# Patient Record
Sex: Female | Born: 1970 | Race: Black or African American | Hispanic: No | Smoking: Current every day smoker
Health system: Southern US, Community
[De-identification: ages and names within clinical notes are randomized; demographics above are authoritative.]

## PROBLEM LIST (undated history)

## (undated) DIAGNOSIS — I341 Nonrheumatic mitral (valve) prolapse: Secondary | ICD-10-CM

## (undated) DIAGNOSIS — K219 Gastro-esophageal reflux disease without esophagitis: Secondary | ICD-10-CM

## (undated) DIAGNOSIS — I10 Essential (primary) hypertension: Secondary | ICD-10-CM

## (undated) DIAGNOSIS — K5790 Diverticulosis of intestine, part unspecified, without perforation or abscess without bleeding: Secondary | ICD-10-CM

## (undated) HISTORY — PX: APPENDECTOMY: SHX54

## (undated) HISTORY — PX: UPPER GASTROINTESTINAL ENDOSCOPY: SHX188

## (undated) HISTORY — DX: Gastro-esophageal reflux disease without esophagitis: K21.9

---

## 1998-06-28 ENCOUNTER — Emergency Department (HOSPITAL_COMMUNITY): Admission: EM | Admit: 1998-06-28 | Discharge: 1998-06-28 | Payer: Self-pay | Admitting: Emergency Medicine

## 1998-12-09 ENCOUNTER — Emergency Department (HOSPITAL_COMMUNITY): Admission: EM | Admit: 1998-12-09 | Discharge: 1998-12-09 | Payer: Self-pay | Admitting: Emergency Medicine

## 1999-03-15 ENCOUNTER — Inpatient Hospital Stay (HOSPITAL_COMMUNITY): Admission: AD | Admit: 1999-03-15 | Discharge: 1999-03-15 | Payer: Self-pay | Admitting: Obstetrics & Gynecology

## 1999-04-13 ENCOUNTER — Encounter (INDEPENDENT_AMBULATORY_CARE_PROVIDER_SITE_OTHER): Payer: Self-pay | Admitting: *Deleted

## 1999-04-15 ENCOUNTER — Encounter: Admission: RE | Admit: 1999-04-15 | Discharge: 1999-04-15 | Payer: Self-pay | Admitting: Pediatrics

## 1999-06-01 ENCOUNTER — Emergency Department (HOSPITAL_COMMUNITY): Admission: EM | Admit: 1999-06-01 | Discharge: 1999-06-01 | Payer: Self-pay | Admitting: Physician Assistant

## 1999-10-25 ENCOUNTER — Encounter: Admission: RE | Admit: 1999-10-25 | Discharge: 1999-10-25 | Payer: Self-pay | Admitting: Family Medicine

## 1999-12-15 ENCOUNTER — Encounter: Admission: RE | Admit: 1999-12-15 | Discharge: 1999-12-15 | Payer: Self-pay | Admitting: Family Medicine

## 2000-02-08 ENCOUNTER — Encounter: Payer: Self-pay | Admitting: Emergency Medicine

## 2000-02-08 ENCOUNTER — Emergency Department (HOSPITAL_COMMUNITY): Admission: EM | Admit: 2000-02-08 | Discharge: 2000-02-08 | Payer: Self-pay | Admitting: Emergency Medicine

## 2000-07-08 ENCOUNTER — Emergency Department (HOSPITAL_COMMUNITY): Admission: EM | Admit: 2000-07-08 | Discharge: 2000-07-08 | Payer: Self-pay | Admitting: Emergency Medicine

## 2000-07-10 ENCOUNTER — Emergency Department (HOSPITAL_COMMUNITY): Admission: EM | Admit: 2000-07-10 | Discharge: 2000-07-10 | Payer: Self-pay | Admitting: Emergency Medicine

## 2000-10-02 ENCOUNTER — Emergency Department (HOSPITAL_COMMUNITY): Admission: EM | Admit: 2000-10-02 | Discharge: 2000-10-02 | Payer: Self-pay | Admitting: Emergency Medicine

## 2000-10-04 ENCOUNTER — Emergency Department (HOSPITAL_COMMUNITY): Admission: EM | Admit: 2000-10-04 | Discharge: 2000-10-04 | Payer: Self-pay | Admitting: Emergency Medicine

## 2001-03-07 ENCOUNTER — Encounter: Payer: Self-pay | Admitting: *Deleted

## 2001-03-07 ENCOUNTER — Inpatient Hospital Stay (HOSPITAL_COMMUNITY): Admission: AD | Admit: 2001-03-07 | Discharge: 2001-03-07 | Payer: Self-pay | Admitting: *Deleted

## 2001-07-22 ENCOUNTER — Inpatient Hospital Stay (HOSPITAL_COMMUNITY): Admission: AD | Admit: 2001-07-22 | Discharge: 2001-07-22 | Payer: Self-pay | Admitting: Obstetrics

## 2001-11-22 ENCOUNTER — Emergency Department (HOSPITAL_COMMUNITY): Admission: EM | Admit: 2001-11-22 | Discharge: 2001-11-22 | Payer: Self-pay

## 2002-03-22 ENCOUNTER — Emergency Department (HOSPITAL_COMMUNITY): Admission: EM | Admit: 2002-03-22 | Discharge: 2002-03-22 | Payer: Self-pay | Admitting: Emergency Medicine

## 2002-07-30 ENCOUNTER — Emergency Department (HOSPITAL_COMMUNITY): Admission: EM | Admit: 2002-07-30 | Discharge: 2002-07-30 | Payer: Self-pay | Admitting: Emergency Medicine

## 2002-07-30 ENCOUNTER — Encounter: Payer: Self-pay | Admitting: Emergency Medicine

## 2003-04-09 ENCOUNTER — Emergency Department (HOSPITAL_COMMUNITY): Admission: EM | Admit: 2003-04-09 | Discharge: 2003-04-09 | Payer: Self-pay | Admitting: Emergency Medicine

## 2003-07-03 ENCOUNTER — Emergency Department (HOSPITAL_COMMUNITY): Admission: EM | Admit: 2003-07-03 | Discharge: 2003-07-04 | Payer: Self-pay | Admitting: Emergency Medicine

## 2003-11-26 ENCOUNTER — Other Ambulatory Visit: Admission: RE | Admit: 2003-11-26 | Discharge: 2003-11-26 | Payer: Self-pay | Admitting: Internal Medicine

## 2003-12-12 ENCOUNTER — Encounter: Admission: RE | Admit: 2003-12-12 | Discharge: 2003-12-12 | Payer: Self-pay | Admitting: Internal Medicine

## 2004-11-10 ENCOUNTER — Emergency Department (HOSPITAL_COMMUNITY): Admission: EM | Admit: 2004-11-10 | Discharge: 2004-11-10 | Payer: Self-pay | Admitting: Emergency Medicine

## 2006-07-24 ENCOUNTER — Emergency Department (HOSPITAL_COMMUNITY): Admission: EM | Admit: 2006-07-24 | Discharge: 2006-07-24 | Payer: Self-pay | Admitting: Emergency Medicine

## 2006-09-08 ENCOUNTER — Emergency Department (HOSPITAL_COMMUNITY): Admission: EM | Admit: 2006-09-08 | Discharge: 2006-09-08 | Payer: Self-pay | Admitting: Emergency Medicine

## 2006-11-10 ENCOUNTER — Encounter (INDEPENDENT_AMBULATORY_CARE_PROVIDER_SITE_OTHER): Payer: Self-pay | Admitting: *Deleted

## 2006-11-25 ENCOUNTER — Emergency Department (HOSPITAL_COMMUNITY): Admission: EM | Admit: 2006-11-25 | Discharge: 2006-11-25 | Payer: Self-pay | Admitting: Emergency Medicine

## 2007-02-07 ENCOUNTER — Emergency Department (HOSPITAL_COMMUNITY): Admission: EM | Admit: 2007-02-07 | Discharge: 2007-02-07 | Payer: Self-pay | Admitting: Pediatrics

## 2007-10-13 ENCOUNTER — Emergency Department (HOSPITAL_COMMUNITY): Admission: EM | Admit: 2007-10-13 | Discharge: 2007-10-13 | Payer: Self-pay | Admitting: Emergency Medicine

## 2008-08-03 ENCOUNTER — Emergency Department (HOSPITAL_COMMUNITY): Admission: EM | Admit: 2008-08-03 | Discharge: 2008-08-03 | Payer: Self-pay | Admitting: Emergency Medicine

## 2009-04-09 ENCOUNTER — Inpatient Hospital Stay (HOSPITAL_COMMUNITY): Admission: EM | Admit: 2009-04-09 | Discharge: 2009-04-11 | Payer: Self-pay | Admitting: Emergency Medicine

## 2009-06-10 ENCOUNTER — Emergency Department (HOSPITAL_COMMUNITY): Admission: EM | Admit: 2009-06-10 | Discharge: 2009-06-10 | Payer: Self-pay | Admitting: Emergency Medicine

## 2009-09-12 HISTORY — PX: OTHER SURGICAL HISTORY: SHX169

## 2009-11-03 ENCOUNTER — Emergency Department (HOSPITAL_COMMUNITY): Admission: EM | Admit: 2009-11-03 | Discharge: 2009-11-03 | Payer: Self-pay | Admitting: Emergency Medicine

## 2009-11-10 ENCOUNTER — Emergency Department (HOSPITAL_COMMUNITY): Admission: EM | Admit: 2009-11-10 | Discharge: 2009-11-10 | Payer: Self-pay | Admitting: Emergency Medicine

## 2010-05-04 ENCOUNTER — Emergency Department (HOSPITAL_COMMUNITY): Admission: EM | Admit: 2010-05-04 | Discharge: 2010-05-04 | Payer: Self-pay | Admitting: Emergency Medicine

## 2010-05-31 ENCOUNTER — Ambulatory Visit: Payer: Self-pay | Admitting: Nurse Practitioner

## 2010-05-31 ENCOUNTER — Inpatient Hospital Stay (HOSPITAL_COMMUNITY): Admission: AD | Admit: 2010-05-31 | Discharge: 2010-06-01 | Payer: Self-pay | Admitting: Obstetrics & Gynecology

## 2010-06-01 ENCOUNTER — Inpatient Hospital Stay (HOSPITAL_COMMUNITY): Admission: EM | Admit: 2010-06-01 | Discharge: 2010-06-05 | Payer: Self-pay | Admitting: Emergency Medicine

## 2010-06-01 ENCOUNTER — Encounter: Payer: Self-pay | Admitting: Obstetrics & Gynecology

## 2010-07-08 ENCOUNTER — Ambulatory Visit: Payer: Self-pay | Admitting: Obstetrics and Gynecology

## 2010-07-14 ENCOUNTER — Ambulatory Visit (HOSPITAL_COMMUNITY): Admission: RE | Admit: 2010-07-14 | Discharge: 2010-07-14 | Payer: Self-pay | Admitting: Obstetrics and Gynecology

## 2010-07-22 ENCOUNTER — Ambulatory Visit: Payer: Self-pay | Admitting: Family Medicine

## 2010-08-30 ENCOUNTER — Inpatient Hospital Stay (HOSPITAL_COMMUNITY)
Admission: RE | Admit: 2010-08-30 | Discharge: 2010-09-01 | Payer: Self-pay | Source: Home / Self Care | Attending: Family Medicine | Admitting: Family Medicine

## 2010-09-07 ENCOUNTER — Ambulatory Visit: Payer: Self-pay | Admitting: Family Medicine

## 2010-09-08 ENCOUNTER — Inpatient Hospital Stay (HOSPITAL_COMMUNITY)
Admission: AD | Admit: 2010-09-08 | Discharge: 2010-09-08 | Payer: Self-pay | Source: Home / Self Care | Attending: Obstetrics & Gynecology | Admitting: Obstetrics & Gynecology

## 2010-09-13 ENCOUNTER — Ambulatory Visit: Payer: Self-pay | Admitting: Family Medicine

## 2010-10-11 ENCOUNTER — Ambulatory Visit
Admission: RE | Admit: 2010-10-11 | Discharge: 2010-10-11 | Payer: Self-pay | Source: Home / Self Care | Attending: Family Medicine | Admitting: Family Medicine

## 2010-10-11 LAB — POCT PREGNANCY, URINE: Preg Test, Ur: NEGATIVE

## 2010-10-12 NOTE — Progress Notes (Signed)
Cheryl Carlson, Cheryl Carlson NO.:  [id number]  MEDICAL RECORD NO.:  [id number]          PATIENT TYPE:  WOC  LOCATION:  [facility]                   FACILITY:  [facility]  PHYSICIAN:  Maryelizabeth Kaufmann, MD  DATE OF BIRTH:  [birth year]  DATE OF SERVICE:  10/11/2010                                 CLINIC NOTE  CHIEF COMPLAINT:  Final postop check.  HISTORY OF PRESENT ILLNESS:  This is a 40 year old gravida 2, para 0-0-2- 0 who is status post myomectomy and drainage of hydrosalpinx with a surgery date of August 30, 2010, who was doing well postoperatively. She denies any fever, chills, nausea, or vomiting.  She initially on her last visit, had a complaint of constipation, which is subsequently resolved and her last bowel movement as of today.  She is not taking any medication or anything in that regard.  She states that she is healing well.  She also notes that she has had some sexual intercourse since the Christmas holiday and she desires to be checked for her pregnancy.  Her last menstrual period is October 01, 2010.  PHYSICAL EXAMINATION:  VITAL SIGNS:  Blood pressure 132/89, pulse 76, temperature 98.5, weight 124.7, and height 65-1/2 inches. ABDOMEN:  Soft, nontender, and nondistended.  Positive bowel sounds. Incision is well healed clean, dry, and intact. CARDIOVASCULAR:  Regular rate and rhythm.  No murmurs, rubs, or gallops. CHEST:  Clear to auscultation bilaterally.  No wheezes, rales, or rhonchi.  ASSESSMENT:  This is a 40 year old gravida 2, para 0-0-2-0, status post myomectomy and drainage of hydrosalpinx for fibroid uterus and infertility.  PLAN:  Continue routine evaluation.  The patient is otherwise doing well postoperatively.  The patient's last Pap smear was in July 08, 2010. Otherwise, the patient may return for her next routine well-woman exam. We will do urine pregnancy test today.  Otherwise, the patient is to continue for contraception.     ______________________________ Maryelizabeth Kaufmann, MD    LC/MEDQ  D:  10/11/2010  T:  10/12/2010  Job:  [id number]

## 2010-11-22 LAB — BASIC METABOLIC PANEL
BUN: 9 mg/dL (ref 6–23)
CO2: 27 mEq/L (ref 19–32)
Calcium: 9.1 mg/dL (ref 8.4–10.5)
Glucose, Bld: 104 mg/dL — ABNORMAL HIGH (ref 70–99)
Sodium: 136 mEq/L (ref 135–145)

## 2010-11-22 LAB — CBC
HCT: 39.2 % (ref 36.0–46.0)
Hemoglobin: 13.1 g/dL (ref 12.0–15.0)
MCH: 29.5 pg (ref 26.0–34.0)
MCHC: 33.6 g/dL (ref 30.0–36.0)
MCV: 92.4 fL (ref 78.0–100.0)
Platelets: 234 10*3/uL (ref 150–400)
RBC: 4.24 MIL/uL (ref 3.87–5.11)
RDW: 14.5 % (ref 11.5–15.5)
WBC: 8.3 10*3/uL (ref 4.0–10.5)

## 2010-11-22 LAB — SURGICAL PCR SCREEN
MRSA, PCR: NEGATIVE
Staphylococcus aureus: NEGATIVE

## 2010-11-22 LAB — PREGNANCY, URINE: Preg Test, Ur: NEGATIVE

## 2010-11-25 LAB — URINALYSIS, ROUTINE W REFLEX MICROSCOPIC
Bilirubin Urine: NEGATIVE
Ketones, ur: 40 mg/dL — AB
Nitrite: NEGATIVE
Nitrite: NEGATIVE
Protein, ur: NEGATIVE mg/dL
Protein, ur: NEGATIVE mg/dL
Urobilinogen, UA: 0.2 mg/dL (ref 0.0–1.0)
pH: 5.5 (ref 5.0–8.0)

## 2010-11-25 LAB — RAPID URINE DRUG SCREEN, HOSP PERFORMED
Amphetamines: NOT DETECTED
Barbiturates: NOT DETECTED
Benzodiazepines: NOT DETECTED

## 2010-11-25 LAB — BASIC METABOLIC PANEL
BUN: 8 mg/dL (ref 6–23)
BUN: 8 mg/dL (ref 6–23)
BUN: 8 mg/dL (ref 6–23)
CO2: 28 mEq/L (ref 19–32)
CO2: 29 mEq/L (ref 19–32)
Calcium: 8.3 mg/dL — ABNORMAL LOW (ref 8.4–10.5)
Calcium: 8.4 mg/dL (ref 8.4–10.5)
Chloride: 100 mEq/L (ref 96–112)
Creatinine, Ser: 0.77 mg/dL (ref 0.4–1.2)
Creatinine, Ser: 0.92 mg/dL (ref 0.4–1.2)
Glucose, Bld: 139 mg/dL — ABNORMAL HIGH (ref 70–99)
Potassium: 4.4 mEq/L (ref 3.5–5.1)

## 2010-11-25 LAB — DIFFERENTIAL
Basophils Absolute: 0.1 10*3/uL (ref 0.0–0.1)
Basophils Relative: 0 % (ref 0–1)
Basophils Relative: 0 % (ref 0–1)
Eosinophils Absolute: 0.1 10*3/uL (ref 0.0–0.7)
Monocytes Absolute: 0.6 10*3/uL (ref 0.1–1.0)
Monocytes Absolute: 0.5 10*3/uL (ref 0.1–1.0)
Monocytes Absolute: 0.9 10*3/uL (ref 0.1–1.0)
Monocytes Relative: 5 % (ref 3–12)
Monocytes Relative: 3 % (ref 3–12)
Neutro Abs: 10.3 10*3/uL — ABNORMAL HIGH (ref 1.7–7.7)
Neutro Abs: 17.8 10*3/uL — ABNORMAL HIGH (ref 1.7–7.7)
Neutrophils Relative %: 55 % (ref 43–77)

## 2010-11-25 LAB — COMPREHENSIVE METABOLIC PANEL
CO2: 24 mEq/L (ref 19–32)
Calcium: 9.1 mg/dL (ref 8.4–10.5)
Chloride: 98 mEq/L (ref 96–112)
Creatinine, Ser: 0.72 mg/dL (ref 0.4–1.2)
GFR calc non Af Amer: >60 mL/min (ref >60)
Glucose, Bld: 109 mg/dL — ABNORMAL HIGH (ref 70–99)
Total Bilirubin: 1 mg/dL (ref 0.3–1.2)

## 2010-11-25 LAB — CBC
HCT: 35.8 % — ABNORMAL LOW (ref 36.0–46.0)
HCT: 41.6 % (ref 36.0–46.0)
Hemoglobin: 14.3 g/dL (ref 12.0–15.0)
Hemoglobin: 13.4 g/dL (ref 12.0–15.0)
MCH: 31 pg (ref 26.0–34.0)
MCH: 31.2 pg (ref 26.0–34.0)
MCH: 31.3 pg (ref 26.0–34.0)
MCH: 31.4 pg (ref 26.0–34.0)
MCH: 31.7 pg (ref 26.0–34.0)
MCHC: 33.7 g/dL (ref 30.0–36.0)
MCHC: 34.3 g/dL (ref 30.0–36.0)
MCHC: 34.2 g/dL (ref 30.0–36.0)
MCV: 92.2 fL (ref 78.0–100.0)
MCV: 92.6 fL (ref 78.0–100.0)
MCV: 92.7 fL (ref 78.0–100.0)
MCV: 92.6 fL (ref 78.0–100.0)
Platelets: 250 10*3/uL (ref 150–400)
Platelets: 267 10*3/uL (ref 150–400)
Platelets: 272 10*3/uL (ref 150–400)
Platelets: 313 10*3/uL (ref 150–400)
RBC: 3.6 MIL/uL — ABNORMAL LOW (ref 3.87–5.11)
RBC: 3.72 MIL/uL — ABNORMAL LOW (ref 3.87–5.11)
RBC: 4.49 MIL/uL (ref 3.87–5.11)
RDW: 14 % (ref 11.5–15.5)
RDW: 14.2 % (ref 11.5–15.5)
RDW: 14.2 % (ref 11.5–15.5)
WBC: 12.2 10*3/uL — ABNORMAL HIGH (ref 4.0–10.5)
WBC: 9.8 10*3/uL (ref 4.0–10.5)

## 2010-11-25 LAB — URINE CULTURE: Culture  Setup Time: 201108240139

## 2010-11-25 LAB — POCT I-STAT, CHEM 8
BUN: 9 mg/dL (ref 6–23)
Creatinine, Ser: 0.9 mg/dL (ref 0.4–1.2)
Hemoglobin: 15 g/dL (ref 12.0–15.0)
Potassium: 3.9 mEq/L (ref 3.5–5.1)
Sodium: 140 mEq/L (ref 135–145)

## 2010-11-25 LAB — WET PREP, GENITAL
Trich, Wet Prep: NONE SEEN
Yeast Wet Prep HPF POC: NONE SEEN

## 2010-11-25 LAB — GC/CHLAMYDIA PROBE AMP, GENITAL
Chlamydia, DNA Probe: NEGATIVE
GC Probe Amp, Genital: NEGATIVE

## 2010-11-25 LAB — POCT PREGNANCY, URINE: Preg Test, Ur: NEGATIVE

## 2010-12-19 LAB — URINALYSIS, ROUTINE W REFLEX MICROSCOPIC
Bilirubin Urine: NEGATIVE
Glucose, UA: NEGATIVE mg/dL
Specific Gravity, Urine: 1.015 (ref 1.005–1.030)
Urobilinogen, UA: 1 mg/dL (ref 0.0–1.0)
pH: 6 (ref 5.0–8.0)

## 2010-12-19 LAB — CBC
HCT: 39.1 % (ref 36.0–46.0)
HCT: 41.6 % (ref 36.0–46.0)
HCT: 41.7 % (ref 36.0–46.0)
HCT: 47.8 % — ABNORMAL HIGH (ref 36.0–46.0)
Hemoglobin: 14.1 g/dL (ref 12.0–15.0)
Hemoglobin: 15.8 g/dL — ABNORMAL HIGH (ref 12.0–15.0)
MCHC: 33.6 g/dL (ref 30.0–36.0)
MCV: 92.2 fL (ref 78.0–100.0)
MCV: 92.3 fL (ref 78.0–100.0)
MCV: 92.8 fL (ref 78.0–100.0)
MCV: 93.1 fL (ref 78.0–100.0)
Platelets: 255 10*3/uL (ref 150–400)
Platelets: 258 10*3/uL (ref 150–400)
Platelets: 273 10*3/uL (ref 150–400)
RBC: 4.24 MIL/uL (ref 3.87–5.11)
RBC: 4.49 MIL/uL (ref 3.87–5.11)
WBC: 11.5 10*3/uL — ABNORMAL HIGH (ref 4.0–10.5)
WBC: 16 10*3/uL — ABNORMAL HIGH (ref 4.0–10.5)
WBC: 20.6 10*3/uL — ABNORMAL HIGH (ref 4.0–10.5)
WBC: 33.8 10*3/uL — ABNORMAL HIGH (ref 4.0–10.5)

## 2010-12-19 LAB — CULTURE, BLOOD (ROUTINE X 2): Culture: NO GROWTH

## 2010-12-19 LAB — CARDIAC PANEL(CRET KIN+CKTOT+MB+TROPI)
CK, MB: 0.4 ng/mL (ref 0.3–4.0)
CK, MB: 0.5 ng/mL (ref 0.3–4.0)
Relative Index: INVALID (ref 0.0–2.5)
Troponin I: 0.01 ng/mL (ref 0.00–0.06)
Troponin I: 0.02 ng/mL (ref 0.00–0.06)

## 2010-12-19 LAB — DIFFERENTIAL
Eosinophils Absolute: 0 10*3/uL (ref 0.0–0.7)
Eosinophils Relative: 0 % (ref 0–5)
Eosinophils Relative: 0 % (ref 0–5)
Eosinophils Relative: 1 % (ref 0–5)
Lymphocytes Relative: 23 % (ref 12–46)
Lymphocytes Relative: 6 % — ABNORMAL LOW (ref 12–46)
Lymphs Abs: 1.3 10*3/uL (ref 0.7–4.0)
Lymphs Abs: 1.4 10*3/uL (ref 0.7–4.0)
Lymphs Abs: 3.7 10*3/uL (ref 0.7–4.0)
Monocytes Absolute: 0.5 10*3/uL (ref 0.1–1.0)
Monocytes Absolute: 1.7 10*3/uL — ABNORMAL HIGH (ref 0.1–1.0)
Monocytes Relative: 2 % — ABNORMAL LOW (ref 3–12)
Monocytes Relative: 5 % (ref 3–12)
Monocytes Relative: 6 % (ref 3–12)
Neutro Abs: 30.7 10*3/uL — ABNORMAL HIGH (ref 1.7–7.7)

## 2010-12-19 LAB — LIPID PANEL
LDL Cholesterol: 69 mg/dL (ref 0–99)
Triglycerides: 51 mg/dL (ref <150)
VLDL: 10 mg/dL (ref 0–40)

## 2010-12-19 LAB — RAPID URINE DRUG SCREEN, HOSP PERFORMED
Amphetamines: NOT DETECTED
Barbiturates: NOT DETECTED
Benzodiazepines: NOT DETECTED
Cocaine: NOT DETECTED

## 2010-12-19 LAB — URINE MICROSCOPIC-ADD ON

## 2010-12-19 LAB — BASIC METABOLIC PANEL
BUN: 10 mg/dL (ref 6–23)
BUN: 11 mg/dL (ref 6–23)
CO2: 24 mEq/L (ref 19–32)
Chloride: 100 mEq/L (ref 96–112)
Chloride: 106 mEq/L (ref 96–112)
Chloride: 107 mEq/L (ref 96–112)
Creatinine, Ser: 0.84 mg/dL (ref 0.4–1.2)
GFR calc non Af Amer: >60 mL/min (ref >60)
Potassium: 3.3 mEq/L — ABNORMAL LOW (ref 3.5–5.1)
Potassium: 3.8 mEq/L (ref 3.5–5.1)
Potassium: 4.2 mEq/L (ref 3.5–5.1)
Sodium: 136 mEq/L (ref 135–145)
Sodium: 138 mEq/L (ref 135–145)

## 2010-12-19 LAB — BLOOD GAS, VENOUS
Acid-Base Excess: 2.6 mmol/L — ABNORMAL HIGH (ref 0.0–2.0)
Bicarbonate: 25 mEq/L — ABNORMAL HIGH (ref 20.0–24.0)
O2 Saturation: 43 %
TCO2: 22.2 mmol/L (ref 0–100)
pCO2, Ven: 33.1 mmHg — ABNORMAL LOW (ref 45.0–50.0)
pO2, Ven: 20.8 mmHg — CL (ref 30.0–45.0)

## 2011-01-25 NOTE — Discharge Summary (Signed)
Cheryl Carlson, Cheryl Carlson               ACCOUNT NO.:  [id number]   MEDICAL RECORD NO.:  [id number]          PATIENT TYPE:  INP   LOCATION:  1418                         FACILITY:  [facility]   PHYSICIAN:  Hind I Elsaid, MD      DATE OF BIRTH:  1971-05-27   DATE OF ADMISSION:  04/08/2009  DATE OF DISCHARGE:  04/11/2009                               DISCHARGE SUMMARY   DISCHARGE DIAGNOSES:  1. Community acquired pneumonia.  2. History of hypertension.  3. History of mitral valve prolapse.  4. Tobacco abuse.  5. Polysubstance abuse.   DISCHARGE MEDICATIONS:  1. Avelox 400 mg p.o. daily for 10 days.  2. HCTZ 25 mg p.o. daily.   PROCEDURES:  1. Chest x-ray suspect early right lobar infiltrate.  2. Mild pectus excavatum deformity of the sternum.   HISTORY OF PRESENT ILLNESS:  This is a 40 year old female with past  medical history of hypertension and ongoing tobacco abuse who came to  the emergency room with progressive shortness of breath for the past 1  month.  Denies any fever or chills.  In the emergency room the patient  was found to have a pH of 7.49, CO2 of 33 and oxygen of 20.8, D-dimer  was 0.42 and noted to have a CBC of 20.6, with neutrophil shift.  Chest  x-ray did show early pneumonia.  Drug screen was positive for opiate and  tetrahydrocannabinol.   HOSPITAL COURSE:  1. Her blood culture x2 was negative and this was preliminary report.      The patient was treated as a case of community acquired pneumonia      with Zithromax and Rocephin.  During the hospital stay the      patient's symptoms significantly improved.  White blood cells      improved and her white blood cells today were found to be 11.5.      The patient was switched to p.o. Avelox.  The patient has no      hypoxia and did not require any form of oxygenation.  At this time      it was felt the patient was stable to be discharged home and the      patient needs to follow up with her primary care physician as an      outpatient.  2. Hypertension.  Her blood pressure remained under control with      hydrochlorothiazide and the patient is noncompliant with her      medication.  3. History of mitral valve prolapse.  The patient has BNP of 30 and no      evidence of JVP.  Chest x-ray revealed no evidence of pulmonary      edema.  The patient's 2-D echo has to be done and followed up with      her primary care physician as an outpatient as the 2-D echo cannot      be done until Monday.      Hind Bosie Helper, MD  Electronically Signed     HIE/MEDQ  D:  04/11/2009  T:  04/11/2009  Job:  [id number] 

## 2011-01-25 NOTE — H&P (Signed)
NAMEALVERNA, FAWLEY               ACCOUNT NO.:  [id number]   MEDICAL RECORD NO.:  [id number]          PATIENT TYPE:  EMS   LOCATION:  ED                           [facility]ACILITY[facility]   PHYSICIAN:  Lucile Crater, MD         DATE OF BIRTH:  1971-02-21   DATE OF ADMISSION:  04/09/2009  DATE OF DISCHARGE:                              HISTORY & PHYSICAL   CHIEF COMPLAINT:  Shortness of breath since the past 63-month.   HISTORY OF PRESENT ILLNESS:  Ms. Printy is a 40 year old female with  past medical history of hypertension and ongoing tobacco abuse.  She  comes to the ER complaining of progressive shortness of breath for the  past one month.  She noted shortness of breath a month ago that has been  gradually getting worse and she has dyspnea on exertion as well.  She  reports that her exercise tolerance has been gradually going down and  also she had difficulty climbing stairs today and she had to stop in the  middle, and so she was concerned and came to the ER.  She reported that  she was also having a productive cough for the past 67-month, but refused  to come see the doctor despite friends and family advising her to do so.  She still continues to smoke despite all of this.  She denies having any  fever or chills.  Upon further questioning, she states that she has  pressure in her chest which is non-radiating, on and off, no  precipitating factors, no alleviating or aggravating factors.  The pain  is not pleuritic, it is not positional and it is not reproduced with  palpation.  She has some nausea, but no vomiting.   REVIEW OF SYSTEMS:  A complete review of systems was done, which  included general, head, eyes, ears, nose, throat, cardiovascular,  respiratory, GI, GU, endocrine, musculoskeletal, skin, neurologic and  psychiatric - all are within normal limits other than what is mentioned  above.   PAST MEDICAL HISTORY:  1. Hypertension.  2. Hypertensive urgency.  3. Mitral valve  prolapse.  4. Tobacco abuse.   ALLERGIES:  NONE.   CURRENT MEDICATIONS AT HOME:  None.   SOCIAL HISTORY:  Tobacco abuse is ongoing, 20 pack years of smoking.  She currently smokes 1 pack per day.  Denies alcohol and illicit drugs.   FAMILY HISTORY:  Coronary artery disease, hypertension, hyperlipidemia.   PHYSICAL EXAMINATION:  VITAL SIGNS:  T-max 99.3, blood pressure 151/93,  pulse rate 112, respiratory rate 22.  O2 saturations 95% on room air.  GENERAL APPEARANCE:  Not in acute distress.  Alert, awake and oriented  x3.  Afebrile.  HEENT:  Normocephalic, atraumatic.  Pupils are equal and react to light  and accommodation.  Extraocular muscles are intact.  Mucous membranes  are moist.  NECK:  Supple.  No JVD, lymphadenopathy or carotid bruit.  CVS:  Regular rhythm, rate is normal.  No murmurs, rubs or gallops.  LUNGS:  Rhonchi bilaterally.  ABDOMEN:  Benign.  EXTREMITIES:  No clubbing, cyanosis or edema.  NEUROLOGIC:  Grossly nonfocal.   LABORATORIES AND STUDIES:  In the ER today, venous blood gas:  pH 7.49,  pCO2 of 33, pO2 of 20.  Chest x-ray two-view:  Suspect early right  basilar infiltrate.  D-dimer 0.42.  Sodium 138, potassium 3.3, chloride  106, bicarb 22, BUN 11, creatinine 0.8, blood glucose 88.  WBC 20,600,  hemoglobin 14, hematocrit 41, platelets 255,000.  Neutrophils 91%.  EKG:  Sinus tachycardia at the rate of 102 beats per minute.  Normal axis.  Normal PR and QT intervals.  QRS morphology normal.  No ST-T changes  indicating ischemia.   ASSESSMENT/PLAN:  1. Community-acquired pneumonia.  The patient has been having these      symptoms for more than a month.  She has elevated white count      today.  We will start her on IV antibiotics.  We will supplement      oxygen if needed.  2. Chest pain.  The patient has risk factors like hypertension,      tobacco abuse and family history.  Her blood pressure is      uncontrolled.  We will cycle cardiac enzymes.  Her EKG  is negative      for any infarction or ischemia.  We will monitor her on telemetry.      If needed, we will get a stress test.  3. Hypertension, uncontrolled.  We will start the patient on      hydrochlorothiazide and lisinopril.  We will titrate the      medications for a goal blood pressure of 130/80 or less.  4. Deep venous thrombosis prophylaxis with unfractionated heparin.  5. Tobacco abuse.  Extensive counseling was done today.  She      verbalized understanding of this.  6. Fluid/Electrolyte/Nutrition:  We will place a saline lock on her.      We will replace her potassium.  She will be started on a healthy      heart diet.      Lucile Crater, MD  Electronically Signed     TA/MEDQ  D:  04/09/2009  T:  04/09/2009  Job:  [id number]

## 2011-06-02 LAB — CBC
HCT: 39
Hemoglobin: 13.3
MCHC: 34.1
MCV: 91.8
RBC: 4.24
RDW: 13.8

## 2011-06-02 LAB — DIFFERENTIAL
Eosinophils Relative: 1
Lymphocytes Relative: 56 — ABNORMAL HIGH
Monocytes Absolute: 0.8
Monocytes Relative: 7
Neutrophils Relative %: 35 — ABNORMAL LOW

## 2011-06-02 LAB — COMPREHENSIVE METABOLIC PANEL
ALT: 11
BUN: 9
CO2: 30
Calcium: 9
Creatinine, Ser: 0.79
GFR calc non Af Amer: >60
Glucose, Bld: 102 — ABNORMAL HIGH

## 2011-06-02 LAB — LIPASE, BLOOD: Lipase: 27

## 2011-06-02 LAB — POCT CARDIAC MARKERS
CKMB, poc: <1 — ABNORMAL LOW
Operator id: 2725

## 2012-05-01 ENCOUNTER — Emergency Department (HOSPITAL_COMMUNITY)
Admission: EM | Admit: 2012-05-01 | Discharge: 2012-05-02 | Disposition: A | Discharge status: Home / Self Care | Payer: Self-pay | Attending: Emergency Medicine | Admitting: Emergency Medicine

## 2012-05-01 ENCOUNTER — Encounter (HOSPITAL_COMMUNITY): Payer: Self-pay | Admitting: *Deleted

## 2012-05-01 DIAGNOSIS — A5909 Other urogenital trichomoniasis: Secondary | ICD-10-CM | POA: Insufficient documentation

## 2012-05-01 DIAGNOSIS — R112 Nausea with vomiting, unspecified: Secondary | ICD-10-CM | POA: Insufficient documentation

## 2012-05-01 DIAGNOSIS — R109 Unspecified abdominal pain: Secondary | ICD-10-CM | POA: Insufficient documentation

## 2012-05-01 HISTORY — DX: Essential (primary) hypertension: I10

## 2012-05-01 HISTORY — DX: Nonrheumatic mitral (valve) prolapse: I34.1

## 2012-05-01 LAB — CBC
Hemoglobin: 15.6 g/dL — ABNORMAL HIGH (ref 12.0–15.0)
MCH: 30.5 pg (ref 26.0–34.0)
MCV: 88.1 fL (ref 78.0–100.0)
RBC: 5.11 MIL/uL (ref 3.87–5.11)

## 2012-05-01 MED ORDER — SODIUM CHLORIDE 0.9 % IV BOLUS (SEPSIS)
1000.0000 mL | Freq: Once | INTRAVENOUS | Status: AC
  Administered 2012-05-02: 1000 mL via INTRAVENOUS

## 2012-05-01 NOTE — ED Notes (Signed)
Pt awoke with emesis at 2 am this morning and feeling constipated.  She had bm, but didn't feel any better.  C/o epigastric pain after vomiting.

## 2012-05-02 ENCOUNTER — Emergency Department (HOSPITAL_COMMUNITY): Payer: Self-pay

## 2012-05-02 ENCOUNTER — Encounter (HOSPITAL_COMMUNITY): Payer: Self-pay | Admitting: Radiology

## 2012-05-02 LAB — URINALYSIS, ROUTINE W REFLEX MICROSCOPIC
Bilirubin Urine: NEGATIVE
Specific Gravity, Urine: 1.012 (ref 1.005–1.030)
Urobilinogen, UA: 1 mg/dL (ref 0.0–1.0)

## 2012-05-02 LAB — URINE MICROSCOPIC-ADD ON

## 2012-05-02 LAB — PROTIME-INR
INR: 1.04 (ref 0.00–1.49)
Prothrombin Time: 13.8 seconds (ref 11.6–15.2)

## 2012-05-02 LAB — DIFFERENTIAL
Basophils Absolute: 0.1 10*3/uL (ref 0.0–0.1)
Basophils Relative: 1 % (ref 0–1)
Eosinophils Absolute: 0 10*3/uL (ref 0.0–0.7)
Eosinophils Relative: 0 % (ref 0–5)
Lymphocytes Relative: 20 % (ref 12–46)
Lymphs Abs: 3.4 10*3/uL (ref 0.7–4.0)
Monocytes Absolute: 1.7 10*3/uL — ABNORMAL HIGH (ref 0.1–1.0)
Monocytes Relative: 10 % (ref 3–12)
Neutro Abs: 11.7 10*3/uL — ABNORMAL HIGH (ref 1.7–7.7)
Neutrophils Relative %: 69 % (ref 43–77)

## 2012-05-02 LAB — BASIC METABOLIC PANEL
BUN: 6 mg/dL (ref 6–23)
CO2: 26 mEq/L (ref 19–32)
Chloride: 101 mEq/L (ref 96–112)
Creatinine, Ser: 0.68 mg/dL (ref 0.50–1.10)

## 2012-05-02 LAB — HEPATIC FUNCTION PANEL
Alkaline Phosphatase: 54 U/L (ref 39–117)
Bilirubin, Direct: 0.1 mg/dL (ref 0.0–0.3)
Total Bilirubin: 0.5 mg/dL (ref 0.3–1.2)

## 2012-05-02 LAB — GLUCOSE, CAPILLARY

## 2012-05-02 MED ORDER — ONDANSETRON HCL 4 MG/2ML IJ SOLN
4.0000 mg | Freq: Once | INTRAMUSCULAR | Status: AC
  Administered 2012-05-02: 4 mg via INTRAVENOUS
  Filled 2012-05-02: qty 2

## 2012-05-02 MED ORDER — IOHEXOL 300 MG/ML  SOLN: 100.0000 mL | Freq: Once | INTRAMUSCULAR | Status: DC | PRN

## 2012-05-02 MED ORDER — ONDANSETRON 8 MG PO TBDP: 8.0000 mg | ORAL_TABLET | Freq: Three times a day (TID) | ORAL | Status: DC | PRN

## 2012-05-02 MED ORDER — PROMETHAZINE HCL 25 MG RE SUPP: 25.0000 mg | Freq: Four times a day (QID) | RECTAL | Status: DC | PRN

## 2012-05-02 MED ORDER — HYDROMORPHONE HCL PF 1 MG/ML IJ SOLN
1.0000 mg | Freq: Once | INTRAMUSCULAR | Status: AC
  Administered 2012-05-02: 1 mg via INTRAVENOUS
  Filled 2012-05-02: qty 1

## 2012-05-02 MED ORDER — SODIUM CHLORIDE 0.9 % IV SOLN
Freq: Once | INTRAVENOUS | Status: AC
  Administered 2012-05-02: 100 mL/h via INTRAVENOUS

## 2012-05-02 MED ORDER — METRONIDAZOLE 500 MG PO TABS
2000.0000 mg | ORAL_TABLET | Freq: Once | ORAL | Status: AC
Start: 2012-05-02 — End: 2012-05-02
  Administered 2012-05-02: 2000 mg via ORAL
  Filled 2012-05-02: qty 4

## 2012-05-02 MED ORDER — HYDROCODONE-ACETAMINOPHEN 5-325 MG PO TABS: 1.0000 | ORAL_TABLET | ORAL | Status: DC | PRN

## 2012-05-02 NOTE — ED Notes (Signed)
PT back from X-ray.  States zofran reduced and pain reduced to 8/10.

## 2012-05-02 NOTE — ED Notes (Signed)
Pt states pain starting to increase now that she is up and moving.  States she will be able to pick meds up at pharmacy soon.  Pt notified of need to inform sexual partner.

## 2012-05-02 NOTE — ED Notes (Signed)
Pt back from CT

## 2012-05-02 NOTE — ED Notes (Signed)
Pt sleeping.  CT called to notify that pt has finished drinking contrast.

## 2012-05-02 NOTE — ED Notes (Signed)
Patient transported to X-ray 

## 2012-05-02 NOTE — ED Provider Notes (Addendum)
History     CSN: [id number]  Arrival date & time 05/01/12  2219   First MD Initiated Contact with Patient 05/01/12 2347      Chief Complaint  Patient presents with  . Emesis  . Abdominal Pain    (Consider location/radiation/quality/duration/timing/severity/associated sxs/prior treatment) HPI Comments: Pt with hx of HTN, appendectomy and partial hysterectomy comes in with cc of abd pain, nausea and emesis. Pt 's abd pain started last night around 2 am, as it woke her up from her sleep. The pain is located in the lower quadrants and suprapubic region, and is sharp, constant at baseline, with worsening episodes with movement and with emesis, po intake. Pt has had 10+ episodes of emesis, non bilious and non bloody. Pt has been constipated, and hasnt had a good BM in 1 week. Her last BM was earlier today, and she self disimpacted a little. Unsure if she is passing flatus.  Patient is a 41 y.o. female presenting with vomiting and abdominal pain. The history is provided by the patient.  Emesis  Associated symptoms include abdominal pain. Pertinent negatives include no headaches.  Abdominal Pain The primary symptoms of the illness include abdominal pain and vomiting. The primary symptoms of the illness do not include shortness of breath, nausea, dysuria, vaginal discharge or vaginal bleeding.  Additional symptoms associated with the illness include constipation. Symptoms associated with the illness do not include urgency or frequency.    Past Medical History  Diagnosis Date  . Mitral valve prolapse   . Hypertension     Past Surgical History  Procedure Date  . Appendectomy   . Uterine tumor removal 2011    No family history on file.  History  Substance Use Topics  . Smoking status: Current Everyday Smoker -- 0.5 packs/day    Types: Cigarettes  . Smokeless tobacco: Not on file  . Alcohol Use: No    OB History    Grav Para Term Preterm Abortions TAB SAB Ect Mult Living              Review of Systems  Constitutional: Positive for activity change.  HENT: Negative for neck pain.   Respiratory: Negative for shortness of breath.   Cardiovascular: Negative for chest pain.  Gastrointestinal: Positive for vomiting, abdominal pain and constipation. Negative for nausea.  Genitourinary: Positive for pelvic pain. Negative for dysuria, urgency, frequency, flank pain, decreased urine volume, vaginal bleeding and vaginal discharge.  Neurological: Negative for headaches.    Allergies  Review of patient's allergies indicates no known allergies.  Home Medications  No current outpatient prescriptions on file.  BP 187/120  Pulse 114  Temp 99.4 F (37.4 C) (Oral)  Resp 18  Ht 5\' 5"  (1.651 m)  Wt 127 lb (57.607 kg)  BMI 21.13 kg/m2  SpO2 99%  Physical Exam  Nursing note and vitals reviewed. Constitutional: She is oriented to person, place, and time. She appears well-developed and well-nourished.  HENT:  Head: Normocephalic and atraumatic.  Eyes: Conjunctivae and EOM are normal. Pupils are equal, round, and reactive to light.  Neck: Normal range of motion. Neck supple.  Cardiovascular: Normal rate, regular rhythm, normal heart sounds and intact distal pulses.   No murmur heard. Pulmonary/Chest: Effort normal. No respiratory distress. She has no wheezes.  Abdominal: Soft. Bowel sounds are normal. She exhibits no distension. There is no tenderness. There is no rebound and no guarding.  Genitourinary: Vagina normal and uterus normal.       External exam -  normal, no lesions Speculum exam: Pt has some white discharge, no blood Bimanual exam: Patient has no CMT, no adnexal tenderness or fullness and cervical os is closed  Neurological: She is alert and oriented to person, place, and time.  Skin: Skin is warm and dry.    ED Course  Procedures (including critical care time)  Labs Reviewed  CBC - Abnormal; Notable for the following:    WBC 16.8 (*)      Hemoglobin 15.6 (*)     All other components within normal limits  URINALYSIS, ROUTINE W REFLEX MICROSCOPIC  APTT  BASIC METABOLIC PANEL  HEPATIC FUNCTION PANEL  PROTIME-INR  PREGNANCY, URINE   No results found.   No diagnosis found.    MDM  DDx includes: SBO ACS syndrome Aortic Dissection Thrombosis of blood vessels Colitis AAA Tumors Intra abdominal abscess Mesenteric ischemia Diverticulitis Peritonitis Appendicitis Hernia Nephrolithiasis Pyelonephritis UTI/Cystitis Ovarian cyst TOA Ectopic pregnancy PID STD  A/P: Pt with surgical hx comes in with cc of sudden onset abd pain with associated n/v. Concerns for possible SBO. We will get AAS and start hydration with basic labs. Pt is s/p partial hysterectomy, and is not having intercourse - so concerns for STD low. She does have hx of uterine tumors. Will reassess, and decide id the patient needs a CT abd/pelvis.  5:46 AM CT negative. Tolerating PO. Will d/c. Pt to f/u with pcp in 2 days  Derwood Kaplan, MD 05/02/12 0546  6:32 AM Pt had specifically mentioned to me that she has no hx of STD and no vaginal discharge. Her Urine is trichomonas positive. Flagyl given. Pt reports having unprotected sex 3 days ago.  Derwood Kaplan, MD 05/02/12 [phone]

## 2012-05-05 ENCOUNTER — Emergency Department (HOSPITAL_COMMUNITY)
Admission: EM | Admit: 2012-05-05 | Discharge: 2012-05-05 | Discharge status: Against Medical Advice | Payer: Self-pay | Attending: Emergency Medicine | Admitting: Emergency Medicine

## 2012-05-05 ENCOUNTER — Encounter (HOSPITAL_COMMUNITY): Payer: Self-pay | Admitting: Family Medicine

## 2012-05-05 DIAGNOSIS — R0789 Other chest pain: Secondary | ICD-10-CM | POA: Insufficient documentation

## 2012-05-05 DIAGNOSIS — R109 Unspecified abdominal pain: Secondary | ICD-10-CM | POA: Insufficient documentation

## 2012-05-05 LAB — URINALYSIS, ROUTINE W REFLEX MICROSCOPIC
Nitrite: POSITIVE — AB
Protein, ur: 100 mg/dL — AB
Specific Gravity, Urine: 1.027 (ref 1.005–1.030)
Urobilinogen, UA: 1 mg/dL (ref 0.0–1.0)

## 2012-05-05 NOTE — ED Notes (Signed)
Pt sts abdominal pain, N,V, since the 20th. Unable to have a BM since the 20th. sts her abdomen is full and putting pressure on chest and making it flutter.

## 2012-09-15 IMAGING — US US TRANSVAGINAL NON-OB
1 series · 13 of 25 positions shown · non-contrast
Comparison: Abdominal pelvic CT 05/04/2010.  Pelvic ultrasound
12/12/2003.

CLINICAL DATA: Abdominal pelvic pain.  Known fibroids and right
adnexal mass on CT.



[Series 1: us pelvis complete modify · 0.19mm/px · 13 of 61 slices shown]
[im 1/61]
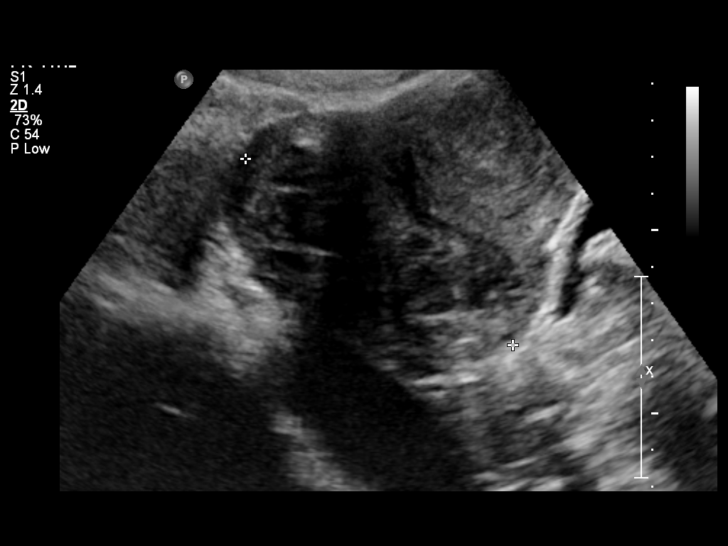
[im 6/61]
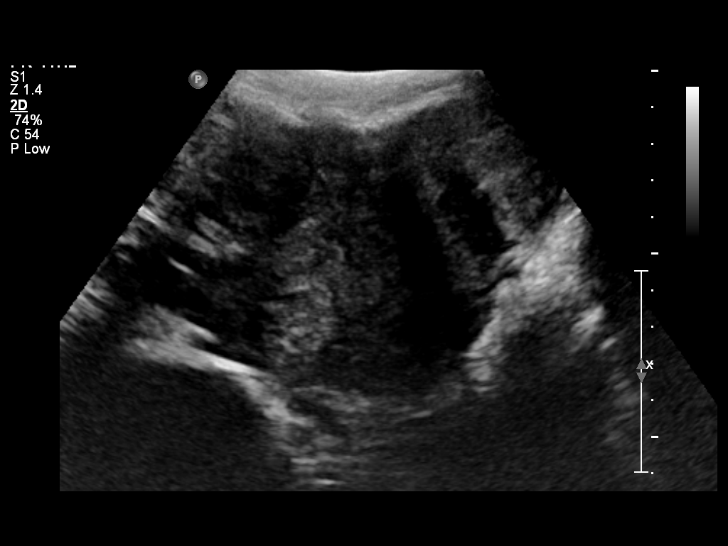
[im 11/61]
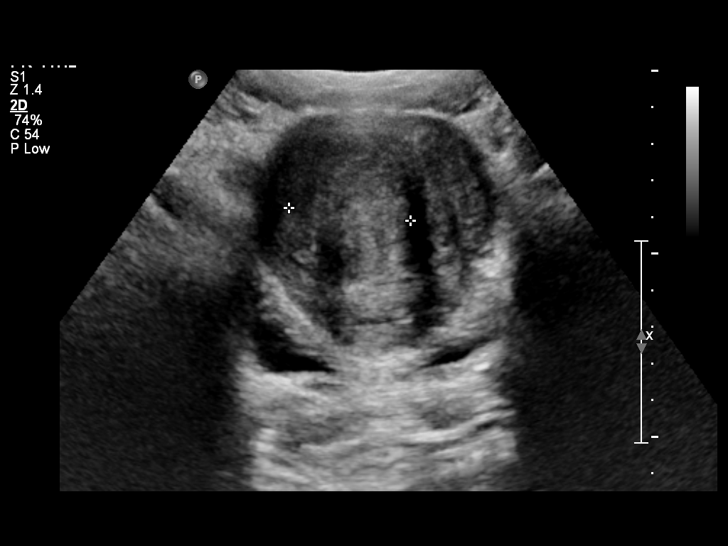
[im 16/61]
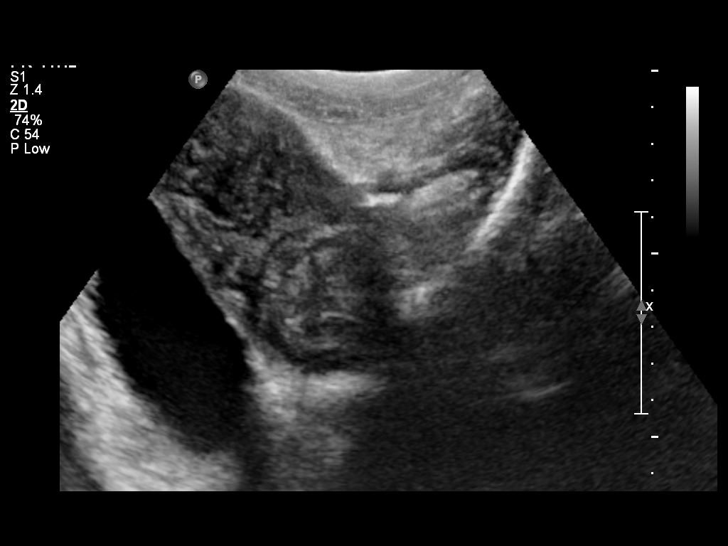
[im 21/61]
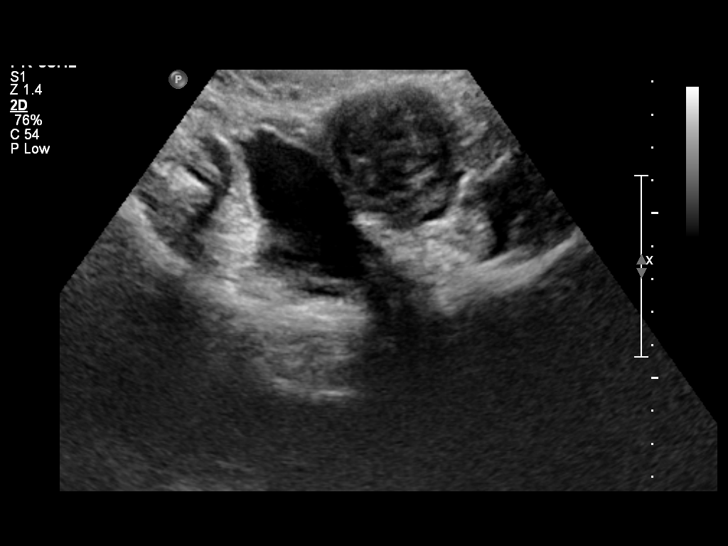
[im 26/61]
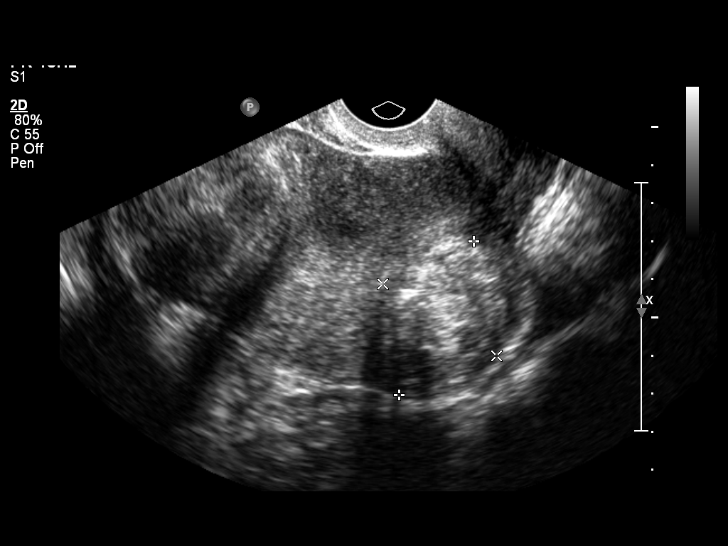
[im 31/61]
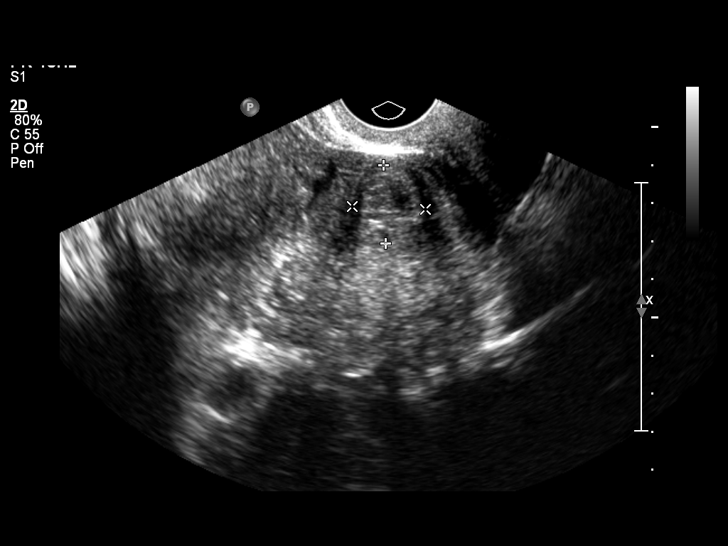
[im 36/61]
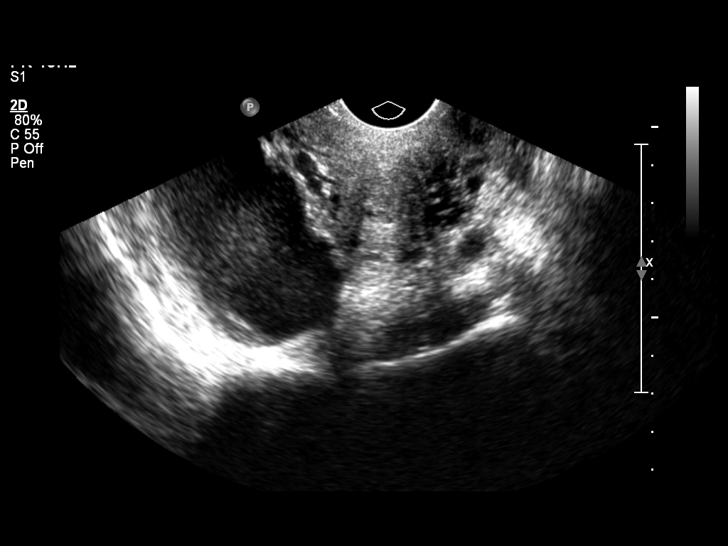
[im 41/61]
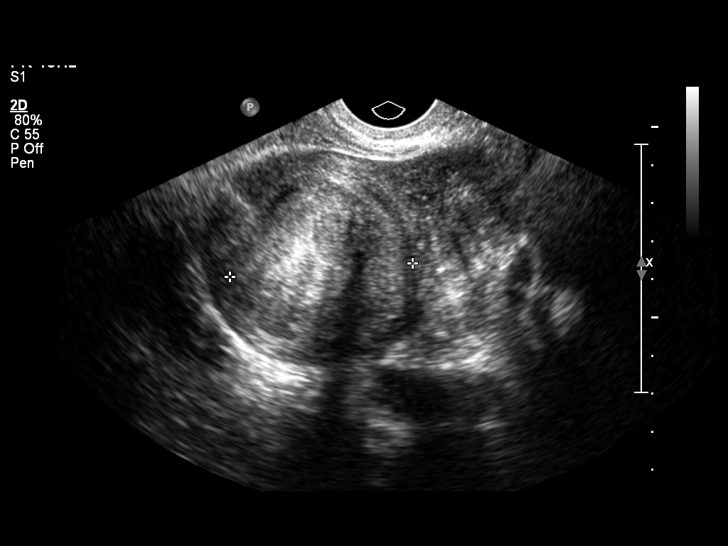
[im 46/61]
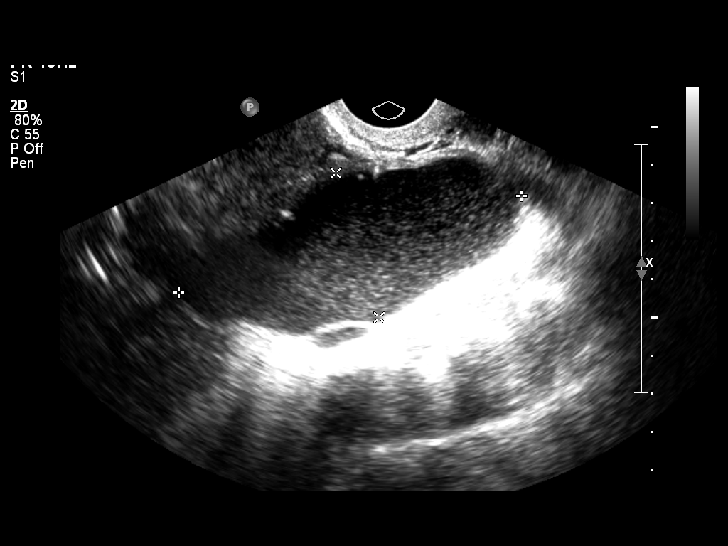
[im 51/61]
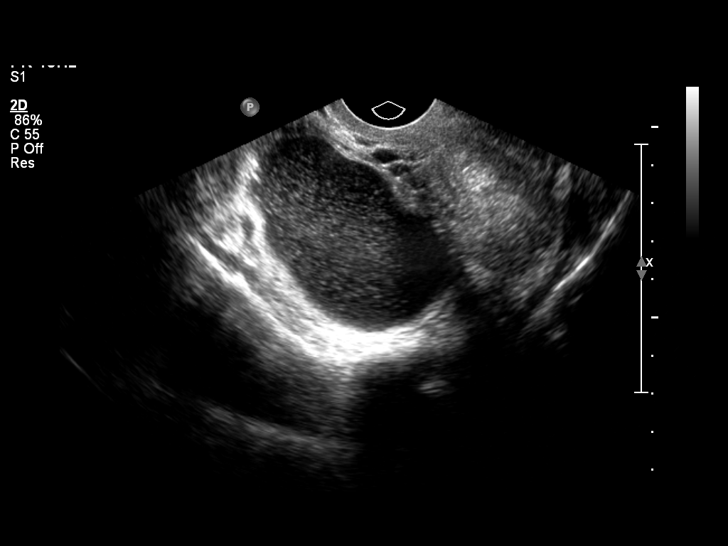
[im 56/61]
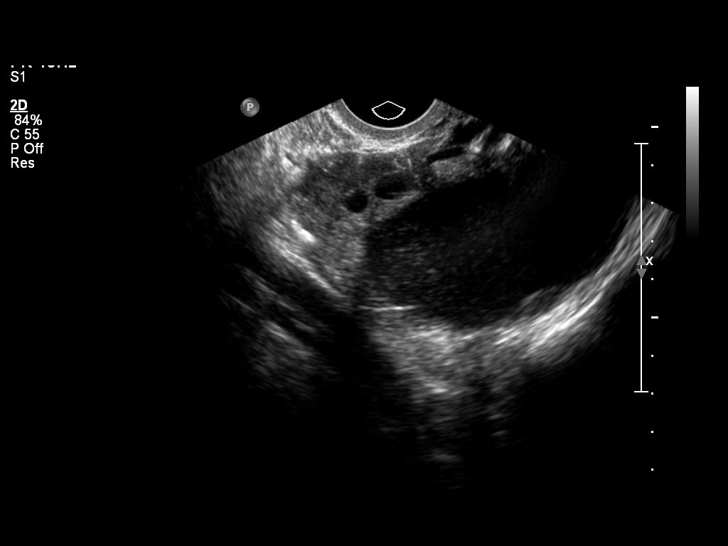
[im 61/61]
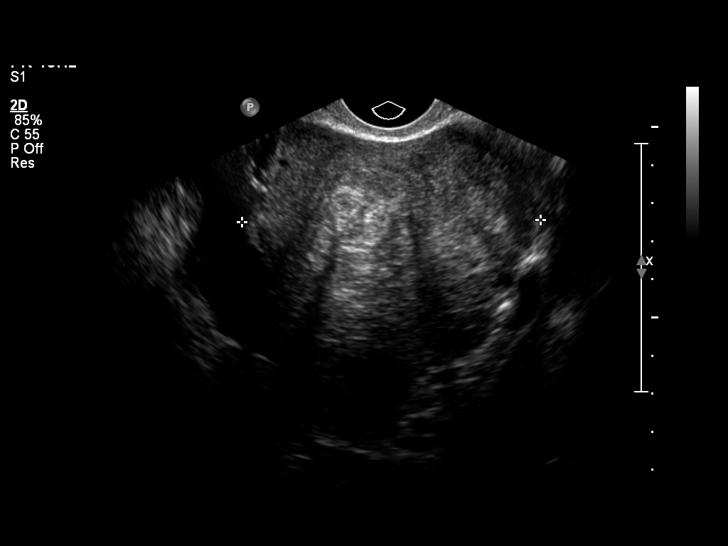

[13 of 25 positions shown; findings below may reference images not displayed]

FINDINGS: Uterus measures approximately 10.8 x 7.8 x 7.8 cm.  The myometrium
is diffusely heterogeneous.  At least five fibroids are
demonstrated.  Largest fibroid is in the fundal region and measures
5.4 x 5.4 x 4.8 cm.

Endometrium is mildly distorted by the fibroids and measures 5.5 mm
in thickness.

Right Ovary abuts a complex tubular cystic right adnexal lesion
which measures approximately 9.3 x 3.9 x 3.9 cm.  This cystic
structure demonstrates diffuse low level echoes.  It appears
unchanged in size from the CT of the 4 weeks ago.

Left Ovary is not visualized.

Other Findings:  No free pelvic fluid or solid adnexal mass is
identified.
IMPRESSION: 1.  Multiple uterine fibroids, unchanged from recent pelvic CT.
2.  Unchanged complex cystic right adnexal lesion may reflect a
hemorrhagic cyst or complicated hydrosalpinx (infection not
excluded although considered unlikely given the stability over 4
weeks).  This does not appear to contain any solid components.
3.  Ultrasound followup in 4-6 weeks is recommended.

## 2012-10-29 IMAGING — US US TRANSVAGINAL NON-OB
1 series · 13 of 25 positions shown · non-contrast
Comparison: CT 06/01/2010, ultrasound 05/31/2010

CLINICAL DATA: Follow-up right ovarian cyst

TRANSVAGINAL ULTRASOUND OF PELVIS
TECHNIQUE: Transvaginal ultrasound examination of the pelvis was
performed including evaluation of the uterus, ovaries, adnexal
regions, and pelvic cul-de-sac.

[Series 1: us transvaginal non-ob · 0.17mm/px · 13 of 57 slices shown]
[im 1/57]
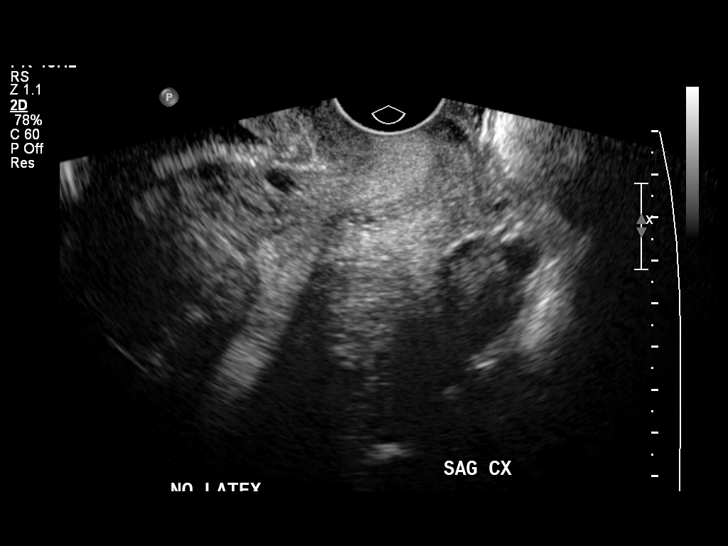
[im 5/57]
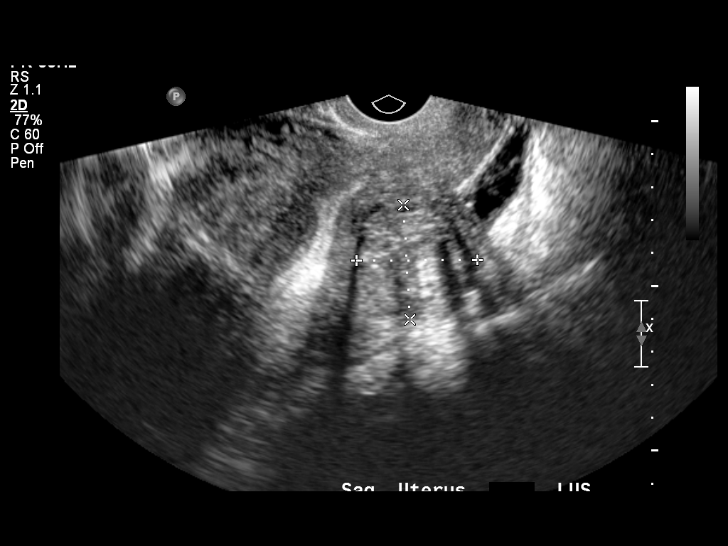
[im 10/57]
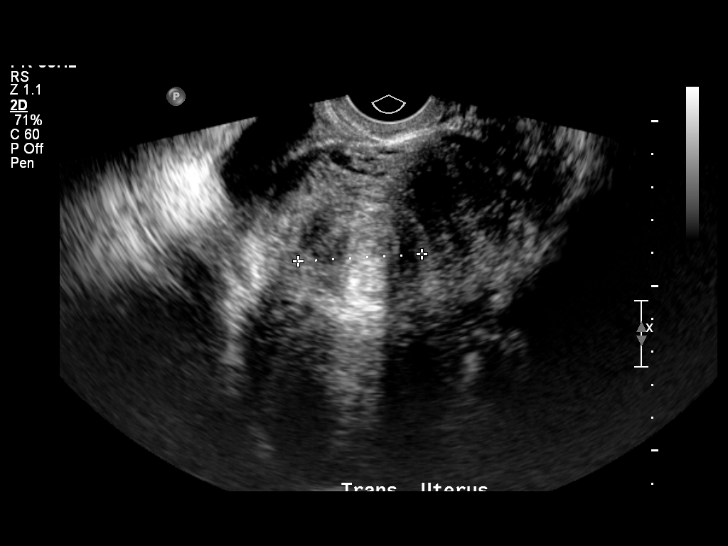
[im 15/57]
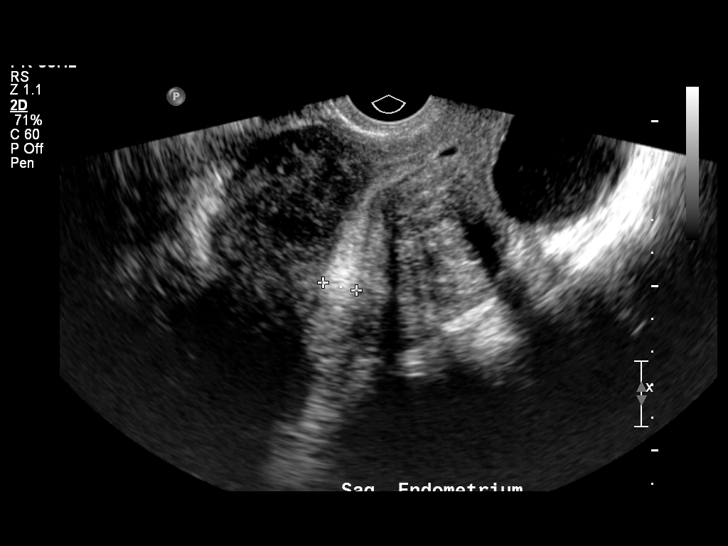
[im 19/57]
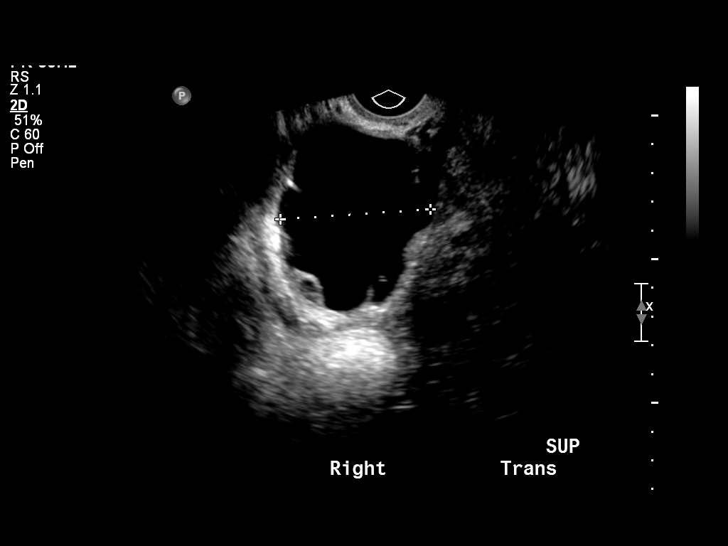
[im 24/57]
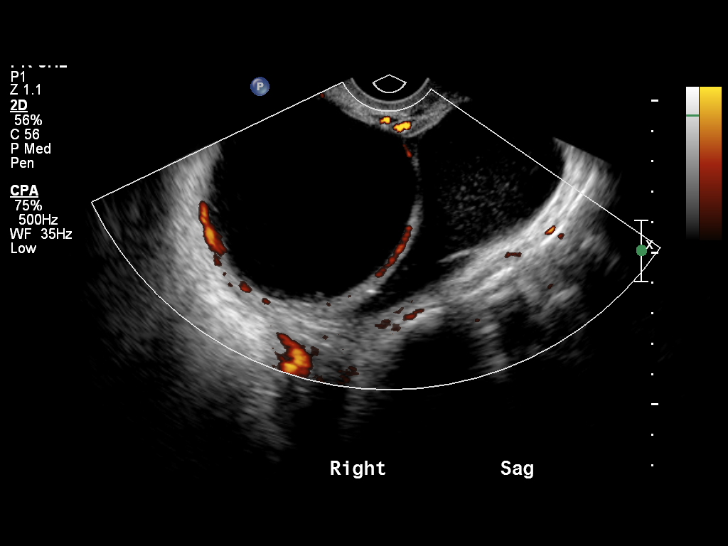
[im 29/57]
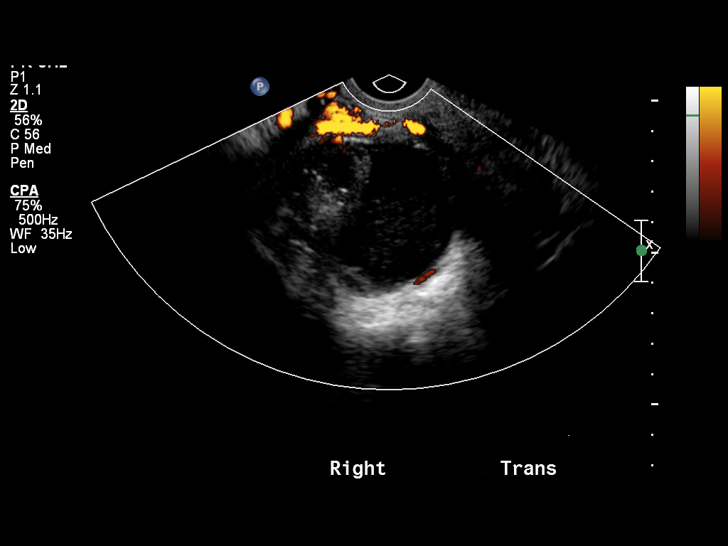
[im 33/57]
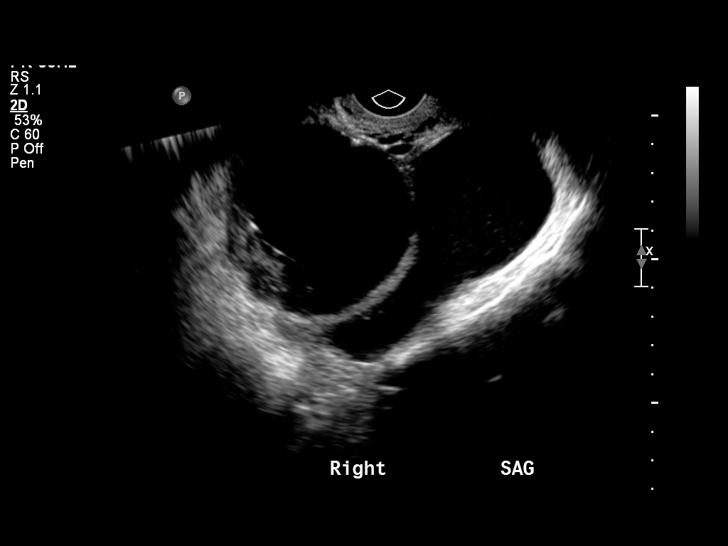
[im 38/57]
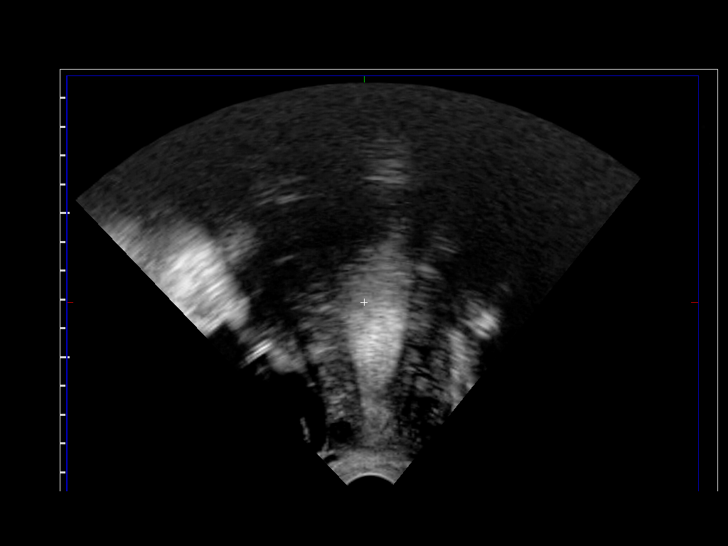
[im 43/57]
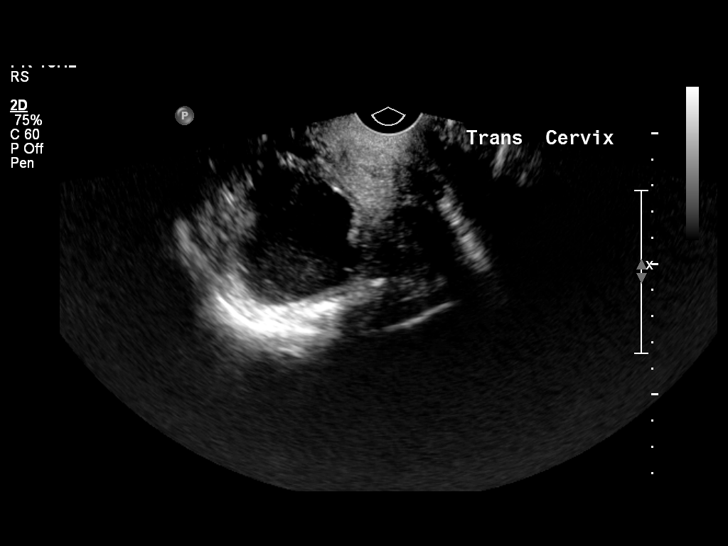
[im 47/57]
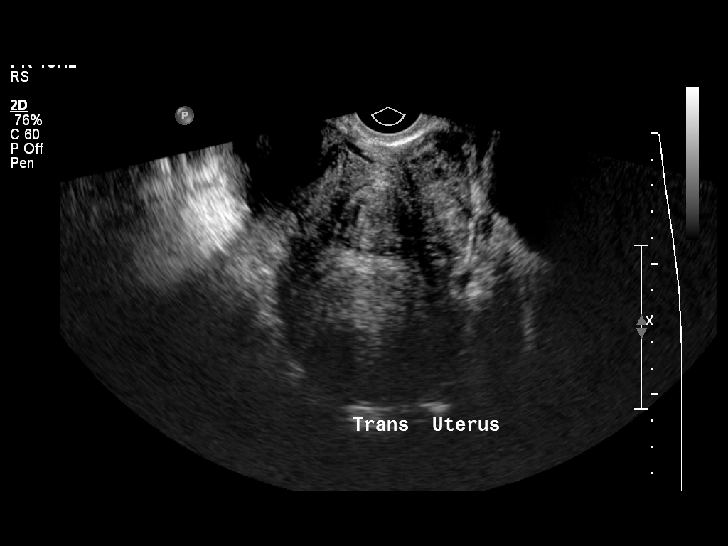
[im 52/57]
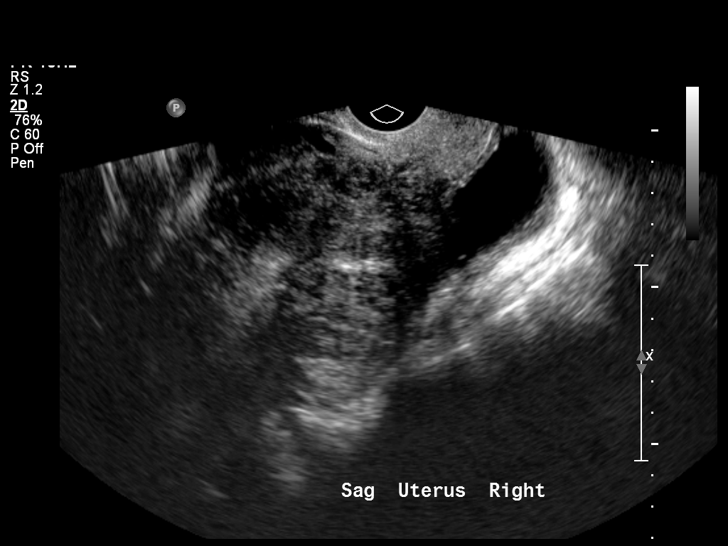
[im 57/57]
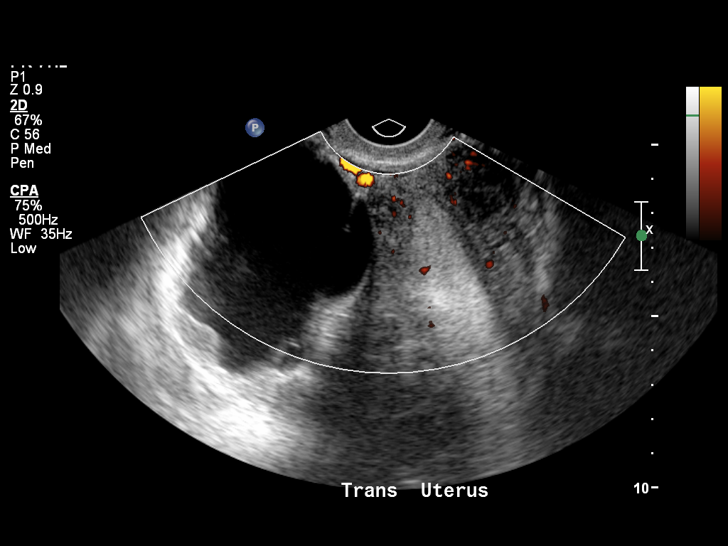

[13 of 25 positions shown; findings below may reference images not displayed]

FINDINGS: Uterus anteverted, anteflexed.  13.3 x 7.5 x 6.9 cm.  Multiple
fibroids again noted, largest at the fundus, partly subserosal /
intramural, 5.6 x 5.0 x 4.5 cm, unchanged.

Endometrium 1.0 cm.  Uniformly thin and echogenic.  Mild mass
effect/displacement by uterine fibroids.

Right Ovary 11.8 x 5.5 x 5.5 cm.  2 predominantly  internally
anechoic cysts are not visualized in the right ovary, likely 2
closely apposed cysts.  One demonstrates dependent internal
echoes/debris, measuring 6.6 x 6.7 x 5.2 cm.  The other more
elongated cyst with minimal internal echoes measures 9.9 x 5.5 x
4.3 cm.

Left Ovary not visualized.  No adnexal mass identified.

Other Findings:  Small amount of free fluid is noted.
IMPRESSION: 2 right ovarian cysts are noted.  These may represent evolution of
the previously seen dominant cyst, with layering debris, but follow-
up ultrasound is recommended in 3 months.

Uterine fibroids again noted.

## 2012-11-11 ENCOUNTER — Emergency Department (HOSPITAL_COMMUNITY): Payer: Self-pay

## 2012-11-11 ENCOUNTER — Encounter (HOSPITAL_COMMUNITY): Payer: Self-pay | Admitting: *Deleted

## 2012-11-11 ENCOUNTER — Emergency Department (HOSPITAL_COMMUNITY)
Admission: EM | Admit: 2012-11-11 | Discharge: 2012-11-11 | Disposition: A | Discharge status: Home / Self Care | Payer: Self-pay | Attending: Emergency Medicine | Admitting: Emergency Medicine

## 2012-11-11 DIAGNOSIS — S60419A Abrasion of unspecified finger, initial encounter: Secondary | ICD-10-CM

## 2012-11-11 DIAGNOSIS — Z8679 Personal history of other diseases of the circulatory system: Secondary | ICD-10-CM | POA: Insufficient documentation

## 2012-11-11 DIAGNOSIS — Z23 Encounter for immunization: Secondary | ICD-10-CM | POA: Insufficient documentation

## 2012-11-11 DIAGNOSIS — F17200 Nicotine dependence, unspecified, uncomplicated: Secondary | ICD-10-CM | POA: Insufficient documentation

## 2012-11-11 DIAGNOSIS — Z79899 Other long term (current) drug therapy: Secondary | ICD-10-CM | POA: Insufficient documentation

## 2012-11-11 DIAGNOSIS — I10 Essential (primary) hypertension: Secondary | ICD-10-CM | POA: Insufficient documentation

## 2012-11-11 DIAGNOSIS — IMO0002 Reserved for concepts with insufficient information to code with codable children: Secondary | ICD-10-CM | POA: Insufficient documentation

## 2012-11-11 MED ORDER — TETANUS-DIPHTH-ACELL PERTUSSIS 5-2.5-18.5 LF-MCG/0.5 IM SUSP
0.5000 mL | Freq: Once | INTRAMUSCULAR | Status: AC
  Administered 2012-11-11: 0.5 mL via INTRAMUSCULAR
  Filled 2012-11-11: qty 0.5

## 2012-11-11 MED ORDER — HYDROCODONE-ACETAMINOPHEN 5-325 MG PO TABS: ORAL_TABLET | ORAL | Status: DC

## 2012-11-11 MED ORDER — IBUPROFEN 600 MG PO TABS: 600.0000 mg | ORAL_TABLET | Freq: Four times a day (QID) | ORAL | Status: DC | PRN

## 2012-11-11 MED ORDER — CEPHALEXIN 500 MG PO CAPS: 500.0000 mg | ORAL_CAPSULE | Freq: Four times a day (QID) | ORAL | Status: DC
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
## 2012-11-13 NOTE — ED Provider Notes (Signed)
Medical screening examination/treatment/procedure(s) were performed by non-physician practitioner and as supervising physician I was immediately available for consultation/collaboration.  Hurman Horn, MD 11/13/12 1329

## 2013-12-08 ENCOUNTER — Emergency Department (HOSPITAL_COMMUNITY)
Admission: EM | Admit: 2013-12-08 | Discharge: 2013-12-08 | Disposition: A | Discharge status: Home / Self Care | Payer: Self-pay | Attending: Emergency Medicine | Admitting: Emergency Medicine

## 2013-12-08 ENCOUNTER — Emergency Department (HOSPITAL_COMMUNITY): Payer: Self-pay

## 2013-12-08 ENCOUNTER — Encounter (HOSPITAL_COMMUNITY): Payer: Self-pay | Admitting: Emergency Medicine

## 2013-12-08 DIAGNOSIS — R05 Cough: Secondary | ICD-10-CM | POA: Insufficient documentation

## 2013-12-08 DIAGNOSIS — R059 Cough, unspecified: Secondary | ICD-10-CM | POA: Insufficient documentation

## 2013-12-08 DIAGNOSIS — R112 Nausea with vomiting, unspecified: Secondary | ICD-10-CM | POA: Insufficient documentation

## 2013-12-08 DIAGNOSIS — J3489 Other specified disorders of nose and nasal sinuses: Secondary | ICD-10-CM | POA: Insufficient documentation

## 2013-12-08 DIAGNOSIS — R197 Diarrhea, unspecified: Secondary | ICD-10-CM | POA: Insufficient documentation

## 2013-12-08 DIAGNOSIS — R6889 Other general symptoms and signs: Secondary | ICD-10-CM

## 2013-12-08 DIAGNOSIS — M6281 Muscle weakness (generalized): Secondary | ICD-10-CM | POA: Insufficient documentation

## 2013-12-08 DIAGNOSIS — I059 Rheumatic mitral valve disease, unspecified: Secondary | ICD-10-CM | POA: Insufficient documentation

## 2013-12-08 DIAGNOSIS — I10 Essential (primary) hypertension: Secondary | ICD-10-CM | POA: Insufficient documentation

## 2013-12-08 DIAGNOSIS — IMO0001 Reserved for inherently not codable concepts without codable children: Secondary | ICD-10-CM | POA: Insufficient documentation

## 2013-12-08 DIAGNOSIS — R51 Headache: Secondary | ICD-10-CM | POA: Insufficient documentation

## 2013-12-08 DIAGNOSIS — B9789 Other viral agents as the cause of diseases classified elsewhere: Secondary | ICD-10-CM | POA: Insufficient documentation

## 2013-12-08 DIAGNOSIS — Z79899 Other long term (current) drug therapy: Secondary | ICD-10-CM | POA: Insufficient documentation

## 2013-12-08 DIAGNOSIS — F17200 Nicotine dependence, unspecified, uncomplicated: Secondary | ICD-10-CM | POA: Insufficient documentation

## 2013-12-08 DIAGNOSIS — B349 Viral infection, unspecified: Secondary | ICD-10-CM

## 2013-12-08 LAB — CBC WITH DIFFERENTIAL/PLATELET
Basophils Absolute: 0.1 10*3/uL (ref 0.0–0.1)
Basophils Relative: 1 % (ref 0–1)
EOS ABS: 0 10*3/uL (ref 0.0–0.7)
EOS PCT: 0 % (ref 0–5)
HCT: 42.6 % (ref 36.0–46.0)
HEMOGLOBIN: 15.3 g/dL — AB (ref 12.0–15.0)
LYMPHS ABS: 2.6 10*3/uL (ref 0.7–4.0)
LYMPHS PCT: 44 % (ref 12–46)
MCH: 31.4 pg (ref 26.0–34.0)
MCHC: 35.9 g/dL (ref 30.0–36.0)
MCV: 87.3 fL (ref 78.0–100.0)
MONOS PCT: 24 % — AB (ref 3–12)
Monocytes Absolute: 1.4 10*3/uL — ABNORMAL HIGH (ref 0.1–1.0)
Neutro Abs: 1.8 10*3/uL (ref 1.7–7.7)
Neutrophils Relative %: 31 % — ABNORMAL LOW (ref 43–77)
Platelets: 254 10*3/uL (ref 150–400)
RBC: 4.88 MIL/uL (ref 3.87–5.11)
RDW: 14.1 % (ref 11.5–15.5)
WBC: 5.8 10*3/uL (ref 4.0–10.5)

## 2013-12-08 LAB — COMPREHENSIVE METABOLIC PANEL
ALBUMIN: 3.7 g/dL (ref 3.5–5.2)
ALT: 15 U/L (ref 0–35)
AST: 24 U/L (ref 0–37)
Alkaline Phosphatase: 49 U/L (ref 39–117)
BUN: 7 mg/dL (ref 6–23)
CALCIUM: 8.6 mg/dL (ref 8.4–10.5)
CO2: 23 mEq/L (ref 19–32)
CREATININE: 0.72 mg/dL (ref 0.50–1.10)
Chloride: 98 mEq/L (ref 96–112)
GFR calc Af Amer: >90 mL/min (ref >90)
GFR calc non Af Amer: >90 mL/min (ref >90)
Glucose, Bld: 117 mg/dL — ABNORMAL HIGH (ref 70–99)
Potassium: 3.7 mEq/L (ref 3.7–5.3)
Sodium: 136 mEq/L — ABNORMAL LOW (ref 137–147)
TOTAL PROTEIN: 7.3 g/dL (ref 6.0–8.3)
Total Bilirubin: <0.2 mg/dL — ABNORMAL LOW (ref 0.3–1.2)

## 2013-12-08 MED ORDER — SODIUM CHLORIDE 0.9 % IV BOLUS (SEPSIS)
500.0000 mL | Freq: Once | INTRAVENOUS | Status: AC
  Administered 2013-12-08: 500 mL via INTRAVENOUS

## 2013-12-08 MED ORDER — ACETAMINOPHEN 325 MG PO TABS
650.0000 mg | ORAL_TABLET | Freq: Once | ORAL | Status: AC
  Administered 2013-12-08: 650 mg via ORAL
  Filled 2013-12-08: qty 2

## 2013-12-08 MED ORDER — KETOROLAC TROMETHAMINE 30 MG/ML IJ SOLN
30.0000 mg | Freq: Once | INTRAMUSCULAR | Status: AC
  Administered 2013-12-08: 30 mg via INTRAVENOUS
  Filled 2013-12-08: qty 1

## 2013-12-08 NOTE — ED Notes (Signed)
Reports bodyaches, fever, chills, headache and n/v/d x 4 days. Mask on pt at triage.

## 2013-12-08 NOTE — Discharge Instructions (Signed)
Influenza, Adult Influenza ("the flu") is a viral infection of the respiratory tract. It occurs more often in winter months because people spend more time in close contact with one another. Influenza can make you feel very sick. Influenza easily spreads from person to person (contagious). CAUSES  Influenza is caused by a virus that infects the respiratory tract. You can catch the virus by breathing in droplets from an infected person's cough or sneeze. You can also catch the virus by touching something that was recently contaminated with the virus and then touching your mouth, nose, or eyes. SYMPTOMS  Symptoms typically last 4 to 10 days and may include:  Fever.  Chills.  Headache, body aches, and muscle aches.  Sore throat.  Chest discomfort and cough.  Poor appetite.  Weakness or feeling tired.  Dizziness.  Nausea or vomiting. DIAGNOSIS  Diagnosis of influenza is often made based on your history and a physical exam. A nose or throat swab test can be done to confirm the diagnosis. RISKS AND COMPLICATIONS You may be at risk for a more severe case of influenza if you smoke cigarettes, have diabetes, have chronic heart disease (such as heart failure) or lung disease (such as asthma), or if you have a weakened immune system. Elderly people and pregnant women are also at risk for more serious infections. The most common complication of influenza is a lung infection (pneumonia). Sometimes, this complication can require emergency medical care and may be life-threatening. PREVENTION  An annual influenza vaccination (flu shot) is the best way to avoid getting influenza. An annual flu shot is now routinely recommended for all adults in the [place] TREATMENT  In mild cases, influenza goes away on its own. Treatment is directed at relieving symptoms. For more severe cases, your caregiver may prescribe antiviral medicines to shorten the sickness. Antibiotic medicines are not effective, because the  infection is caused by a virus, not by bacteria. HOME CARE INSTRUCTIONS  Only take over-the-counter or prescription medicines for pain, discomfort, or fever as directed by your caregiver.  Use a cool mist humidifier to make breathing easier.  Get plenty of rest until your temperature returns to normal. This usually takes 3 to 4 days.  Drink enough fluids to keep your urine clear or pale yellow.  Cover your mouth and nose when coughing or sneezing, and wash your hands well to avoid spreading the virus.  Stay home from work or school until your fever has been gone for at least 1 full day. SEEK MEDICAL CARE IF:   You have chest pain or a deep cough that worsens or produces more mucus.  You have nausea, vomiting, or diarrhea. SEEK IMMEDIATE MEDICAL CARE IF:   You have difficulty breathing, shortness of breath, or your skin or nails turn bluish.  You have severe neck pain or stiffness.  You have a severe headache, facial pain, or earache.  You have a worsening or recurring fever.  You have nausea or vomiting that cannot be controlled. MAKE SURE YOU:  Understand these instructions.  Will watch your condition.  Will get help right away if you are not doing well or get worse. Document Released: 08/26/2000 Document Revised: 02/28/2012 Document Reviewed: 11/28/2011 [facility] Patient Information 2014 [place], [place].  Viral Infections A viral infection can be caused by different types of viruses.Most viral infections are not serious and resolve on their own. However, some infections may cause severe symptoms and may lead to further complications. SYMPTOMS Viruses can frequently cause:  Minor sore  throat.  Aches and pains.  Headaches.  Runny nose.  Different types of rashes.  Watery eyes.  Tiredness.  Cough.  Loss of appetite.  Gastrointestinal infections, resulting in nausea, vomiting, and diarrhea. These symptoms do not respond to antibiotics because the  infection is not caused by bacteria. However, you might catch a bacterial infection following the viral infection. This is sometimes called a "superinfection." Symptoms of such a bacterial infection may include:  Worsening sore throat with pus and difficulty swallowing.  Swollen neck glands.  Chills and a high or persistent fever.  Severe headache.  Tenderness over the sinuses.  Persistent overall ill feeling (malaise), muscle aches, and tiredness (fatigue).  Persistent cough.  Yellow, green, or brown mucus production with coughing. HOME CARE INSTRUCTIONS   Only take over-the-counter or prescription medicines for pain, discomfort, diarrhea, or fever as directed by your caregiver.  Drink enough water and fluids to keep your urine clear or pale yellow. Sports drinks can provide valuable electrolytes, sugars, and hydration.  Get plenty of rest and maintain proper nutrition. Soups and broths with crackers or rice are fine. SEEK IMMEDIATE MEDICAL CARE IF:   You have severe headaches, shortness of breath, chest pain, neck pain, or an unusual rash.  You have uncontrolled vomiting, diarrhea, or you are unable to keep down fluids.  You or your child has an oral temperature above 102 F (38.9 C), not controlled by medicine.  Your baby is older than 3 months with a rectal temperature of 102 F (38.9 C) or higher.  Your baby is 10 months old or younger with a rectal temperature of 100.4 F (38 C) or higher. MAKE SURE YOU:   Understand these instructions.  Will watch your condition.  Will get help right away if you are not doing well or get worse. Document Released: 06/08/2005 Document Revised: 11/21/2011 Document Reviewed: 01/03/2011 [facility] Patient Information 2014 [place], [place].

## 2013-12-08 NOTE — ED Notes (Signed)
Pt states fever, cough, N/V, chills and generalized body aches. Ongoing x4 days. Pt states no relief of symptoms at home. Respirations unlabored. Pt is alert and oriented x4.

## 2013-12-08 NOTE — ED Provider Notes (Signed)
Medical screening examination/treatment/procedure(s) were performed by non-physician practitioner and as supervising physician I was immediately available for consultation/collaboration.     Veryl Speak, MD 12/08/13 [phone]

## 2013-12-08 NOTE — ED Provider Notes (Signed)
CSN: [id number]     Arrival date & time 12/08/13  1652 History   First MD Initiated Contact with Patient 12/08/13 1710     Chief Complaint  Patient presents with  . Influenza     (Consider location/radiation/quality/duration/timing/severity/associated sxs/prior Treatment) HPI Comments: Pt is a 43 y/o female with a PMHx of MVP and HTN who presents to the emergency department complaining of generalized body aches, fatigue, fever, chills, cough, headaches, congestion, nausea, vomiting and diarrhea x4 days. States her temperatures at home have been between 100 and 101, she has been taking Motrin with minimal relief. She has also tried taking Mucinex and home remedies with minimal relief. A family member was recently sick with similar symptoms. Patient did not receive a flu vaccine this year. Appetite is decreased, she has had a few episodes of nonbloody emesis and nonbloody diarrhea. Denies chest pain or shortness of breath.  Patient is a 43 y.o. female presenting with flu symptoms. The history is provided by the patient.  Influenza Presenting symptoms: cough, diarrhea, fatigue, fever, headache, myalgias, nausea and vomiting   Associated symptoms: nasal congestion     Past Medical History  Diagnosis Date  . Mitral valve prolapse   . Hypertension    Past Surgical History  Procedure Laterality Date  . Appendectomy    . Uterine tumor removal  2011   History reviewed. No pertinent family history. History  Substance Use Topics  . Smoking status: Current Every Day Smoker -- 0.50 packs/day    Types: Cigarettes  . Smokeless tobacco: Not on file  . Alcohol Use: No   OB History   Grav Para Term Preterm Abortions TAB SAB Ect Mult Living                 Review of Systems  Constitutional: Positive for fever, appetite change and fatigue.  HENT: Positive for congestion.   Respiratory: Positive for cough.   Gastrointestinal: Positive for nausea, vomiting and diarrhea.  Musculoskeletal:  Positive for arthralgias and myalgias.  Neurological: Positive for weakness and headaches.  All other systems reviewed and are negative.      Allergies  Review of patient's allergies indicates no known allergies.  Home Medications   Current Outpatient Rx  Name  Route  Sig  Dispense  Refill  . cephALEXin (KEFLEX) 500 MG capsule   Oral   Take 1 capsule (500 mg total) by mouth 4 (four) times daily.   28 capsule   0   . fish oil-omega-3 fatty acids 1000 MG capsule   Oral   Take 1 g by mouth daily.         Marland Kitchen HYDROcodone-acetaminophen (NORCO/VICODIN) 5-325 MG per tablet      Take 1-2 tablets every 6 hours as needed for severe pain   6 tablet   0   . ibuprofen (ADVIL,MOTRIN) 600 MG tablet   Oral   Take 1 tablet (600 mg total) by mouth every 6 (six) hours as needed for pain.   20 tablet   0   . Multiple Vitamins-Minerals (HAIR/SKIN/NAILS) TABS   Oral   Take 1 tablet by mouth daily.         . vitamin C (ASCORBIC ACID) 500 MG tablet   Oral   Take 500 mg by mouth daily.          BP 156/109  Pulse 96  Temp(Src) 100.2 F (37.9 C) (Oral)  Resp 22  Wt 141 lb 2 oz (64.014 kg)  SpO2 98%  LMP 11/10/2013 Physical Exam  Nursing note and vitals reviewed. Constitutional: She is oriented to person, place, and time. She appears well-developed and well-nourished. No distress. Face mask in place.  Shaking chills, clammy.  HENT:  Head: Normocephalic and atraumatic.  Mouth/Throat: Oropharynx is clear and moist.  Eyes: Conjunctivae are normal.  Neck: Normal range of motion. Neck supple.  Cardiovascular: Regular rhythm, normal heart sounds and intact distal pulses.   Tachy ~100  Pulmonary/Chest: Effort normal and breath sounds normal.  Harsh cough present.  Abdominal: Soft. Bowel sounds are normal. There is no tenderness.  Musculoskeletal: Normal range of motion. She exhibits no edema.  Lymphadenopathy:    She has no cervical adenopathy.  Neurological: She is alert and  oriented to person, place, and time.  Skin: Skin is warm. She is not diaphoretic.  Psychiatric: She has a normal mood and affect. Her behavior is normal.    ED Course  Procedures (including critical care time) Labs Review Labs Reviewed  COMPREHENSIVE METABOLIC PANEL - Abnormal; Notable for the following:    Sodium 136 (*)    Glucose, Bld 117 (*)    Total Bilirubin <0.2 (*)    All other components within normal limits  CBC WITH DIFFERENTIAL - Abnormal; Notable for the following:    Hemoglobin 15.3 (*)    Neutrophils Relative % 31 (*)    Monocytes Relative 24 (*)    Monocytes Absolute 1.4 (*)    All other components within normal limits  URINALYSIS, ROUTINE W REFLEX MICROSCOPIC   Imaging Review Dg Chest 2 View  12/08/2013   CLINICAL DATA:  Influenza. Dry cough. Emesis. Mitral valve prolapse.  EXAM: CHEST  2 VIEW  COMPARISON:  DG CHEST 2 VIEW dated 04/08/2009  FINDINGS: The heart size and mediastinal contours are within normal limits. Both lungs are clear. The visualized skeletal structures are unremarkable.  IMPRESSION: No active cardiopulmonary disease.   Electronically Signed   By: Sherryl Barters M.D.   On: 12/08/2013 18:19     EKG Interpretation None      MDM   Final diagnoses:  Flu-like symptoms  Viral illness    Patient presenting with flulike symptoms. She appears in no apparent distress. Temperature 100.2. Mild tachycardia around 100. Harsh cough noted. Chest x-ray pending. Labs pending. Patient receiving IV fluids. 7:47 PM Labs about acute finding. Chest x-ray clear. Patient states she's feeling better after receiving IV fluids, she is tolerating by mouth fluids without any difficulty. She is sitting up and looks much better than when she initially presented. Stable for discharge. Discussed symptomatic treatment. Return precautions given. Patient states understanding of treatment care plan and is agreeable.    Illene Labrador, PA-C 12/08/13 1948

## 2014-05-22 ENCOUNTER — Emergency Department (HOSPITAL_COMMUNITY)
Admission: EM | Admit: 2014-05-22 | Discharge: 2014-05-23 | Disposition: A | Discharge status: Home / Self Care | Payer: Self-pay | Attending: Emergency Medicine | Admitting: Emergency Medicine

## 2014-05-22 ENCOUNTER — Emergency Department (HOSPITAL_COMMUNITY): Payer: Self-pay

## 2014-05-22 ENCOUNTER — Encounter (HOSPITAL_COMMUNITY): Payer: Self-pay | Admitting: Emergency Medicine

## 2014-05-22 DIAGNOSIS — R51 Headache: Secondary | ICD-10-CM | POA: Insufficient documentation

## 2014-05-22 DIAGNOSIS — R079 Chest pain, unspecified: Secondary | ICD-10-CM | POA: Insufficient documentation

## 2014-05-22 DIAGNOSIS — F17200 Nicotine dependence, unspecified, uncomplicated: Secondary | ICD-10-CM | POA: Insufficient documentation

## 2014-05-22 DIAGNOSIS — Z79899 Other long term (current) drug therapy: Secondary | ICD-10-CM | POA: Insufficient documentation

## 2014-05-22 DIAGNOSIS — Z7982 Long term (current) use of aspirin: Secondary | ICD-10-CM | POA: Insufficient documentation

## 2014-05-22 DIAGNOSIS — I10 Essential (primary) hypertension: Secondary | ICD-10-CM | POA: Insufficient documentation

## 2014-05-22 LAB — CBC
HEMATOCRIT: 43 % (ref 36.0–46.0)
HEMOGLOBIN: 14.9 g/dL (ref 12.0–15.0)
MCH: 30 pg (ref 26.0–34.0)
MCHC: 34.7 g/dL (ref 30.0–36.0)
MCV: 86.7 fL (ref 78.0–100.0)
Platelets: 342 10*3/uL (ref 150–400)
RBC: 4.96 MIL/uL (ref 3.87–5.11)
RDW: 13.8 % (ref 11.5–15.5)
WBC: 9.4 10*3/uL (ref 4.0–10.5)

## 2014-05-22 LAB — BASIC METABOLIC PANEL
Anion gap: 13 (ref 5–15)
BUN: 15 mg/dL (ref 6–23)
CHLORIDE: 99 meq/L (ref 96–112)
CO2: 26 mEq/L (ref 19–32)
Calcium: 9.6 mg/dL (ref 8.4–10.5)
Creatinine, Ser: 0.77 mg/dL (ref 0.50–1.10)
GFR calc Af Amer: >90 mL/min (ref >90)
GFR calc non Af Amer: >90 mL/min (ref >90)
GLUCOSE: 88 mg/dL (ref 70–99)
Potassium: 4.3 mEq/L (ref 3.7–5.3)
Sodium: 138 mEq/L (ref 137–147)

## 2014-05-22 LAB — I-STAT TROPONIN, ED: Troponin i, poc: 0.01 ng/mL (ref 0.00–0.08)

## 2014-05-22 NOTE — ED Notes (Signed)
Pt reports intermittent chest pressure x 1 week. States yesterday she noted her BP to be >329 systolic, despite taking her BP medications. States yesterday she started to experience generalized weakness, dizziness and HA. Pt reports 5/10 chest pressure and 5/10 HA. Denies vision changes. NAD. AO x4.

## 2014-05-22 NOTE — ED Notes (Signed)
Pt ambulatory to room B19 with steady gait, NAD

## 2014-05-22 NOTE — ED Provider Notes (Signed)
CSN: [id number]     Arrival date & time 05/22/14  1908 History   First MD Initiated Contact with Patient 05/22/14 2025     Chief Complaint  Patient presents with  . Hypertension  . Chest Pain    Patient is a 43 y.o. female presenting with hypertension and chest pain. The history is provided by the patient.  Hypertension This is a recurrent problem. The current episode started yesterday. The problem occurs intermittently. The problem has been gradually improving. Associated symptoms include chest pain and headaches. Pertinent negatives include no abdominal pain, chills, congestion, coughing, diaphoresis, fever, joint swelling, nausea, neck pain, numbness, sore throat, vertigo, visual change, vomiting or weakness. Associated symptoms comments: lightheaded.  Chest Pain Pain location:  Unable to specify Pain quality: aching   Pain radiates to:  Does not radiate Pain radiates to the back: no   Pain severity:  Mild Onset quality:  Gradual Duration:  1 day Progression:  Improving Chronicity:  Recurrent Context: at rest   Context comment:  High BP Relieved by:  Rest Associated symptoms: headache   Associated symptoms: no abdominal pain, no cough, no diaphoresis, no fever, no lower extremity edema, no nausea, no near-syncope, no numbness, no palpitations, no shortness of breath, not vomiting and no weakness   Risk factors: hypertension and smoking   Risk factors: no coronary artery disease, no high cholesterol and no prior DVT/PE    Pt presents for HA and CP which typically occur when her BP is very high.  Today noted SBP 215, then improved with rest and sx improved.  This is a recurrent problem. She also notes CP with exertion in history. No orthopnea, lower extremity edema. Pt takes HCTZ but hasnt seen PCP in a very long time.   Past Medical History  Diagnosis Date  . Mitral valve prolapse   . Hypertension    Past Surgical History  Procedure Laterality Date  . Appendectomy    .  Uterine tumor removal  2011   No family history on file. History  Substance Use Topics  . Smoking status: Current Every Day Smoker -- 0.50 packs/day    Types: Cigarettes  . Smokeless tobacco: Not on file  . Alcohol Use: No   OB History   Grav Para Term Preterm Abortions TAB SAB Ect Mult Living                 Review of Systems  Constitutional: Negative for fever, chills and diaphoresis.  HENT: Negative for congestion and sore throat.   Respiratory: Negative for cough and shortness of breath.   Cardiovascular: Positive for chest pain. Negative for palpitations and near-syncope.  Gastrointestinal: Negative for nausea, vomiting and abdominal pain.  Musculoskeletal: Negative for joint swelling and neck pain.  Neurological: Positive for headaches. Negative for vertigo, weakness and numbness.      Allergies  Review of patient's allergies indicates no known allergies.  Home Medications   Prior to Admission medications   Medication Sig Start Date End Date Taking? Authorizing Provider  aspirin EC 81 MG tablet Take 81 mg by mouth daily.   Yes Historical Provider, MD  HYDROcodone-acetaminophen (NORCO/VICODIN) 5-325 MG per tablet Take 1 tablet by mouth daily.   Yes Historical Provider, MD  Multiple Vitamins-Minerals (HAIR/SKIN/NAILS) TABS Take 3 tablets by mouth daily.    Yes Historical Provider, MD   BP 166/103  Pulse 72  Temp(Src) 98.6 F (37 C) (Oral)  Resp 18  SpO2 100%  LMP 05/22/2014 Physical Exam  Nursing note and vitals reviewed. Constitutional: She is oriented to person, place, and time. She appears well-developed and well-nourished. No distress.  Well appearing  HENT:  Head: Normocephalic and atraumatic.  Nose: Nose normal.  Mouth/Throat: Oropharynx is clear and moist. No oropharyngeal exudate.  Eyes: Conjunctivae are normal.  Neck: Normal range of motion. Neck supple. No tracheal deviation present.  No nuchal rigidity  Cardiovascular: Normal rate, regular rhythm  and normal heart sounds.   No murmur heard. Pulmonary/Chest: Effort normal and breath sounds normal. No respiratory distress. She has no rales.  Abdominal: Soft. Bowel sounds are normal. She exhibits no distension and no mass. There is no tenderness.  Musculoskeletal: Normal range of motion. She exhibits no edema.  Neurological: She is alert and oriented to person, place, and time. She has normal reflexes. She displays normal reflexes. No cranial nerve deficit. She exhibits normal muscle tone. Coordination normal.  Normal FTN, no drift. Normal gait.  Reflexes equal.  Normal sensation.    Skin: Skin is warm and dry. No rash noted.  Psychiatric: She has a normal mood and affect.    ED Course  Procedures (including critical care time) Labs Review Labs Reviewed  CBC  BASIC METABOLIC PANEL  I-STAT [place], ED  [facility]ol[facility] Idol, ED    Imaging Review Dg Chest 2 View  05/22/2014   CLINICAL DATA:  Chest pain  EXAM: CHEST  2 VIEW  COMPARISON:  12/08/2013  FINDINGS: The heart size and mediastinal contours are within normal limits. Both lungs are clear. The visualized skeletal structures are unremarkable.  IMPRESSION: No active cardiopulmonary disease.   Electronically Signed   By: Inez Catalina M.D.   On: 05/22/2014 20:49     EKG Interpretation   Date/Time:  Thursday May 22 2014 19:16:25 EDT Ventricular Rate:  73 PR Interval:  146 QRS Duration: 70 QT Interval:  382 QTC Calculation: 420 R Axis:   93 Text Interpretation:  Normal sinus rhythm Rightward axis Borderline ECG  rate is slower on today's EKG Confirmed by Christy Gentles  MD, Elenore Rota ([id number]) on  05/22/2014 8:11:16 PM      MDM   Final diagnoses:  Essential hypertension  Chest pain, unspecified chest pain type    HA improving and chest pain resolved by presentation. Suspect sx related to hypertension. Completely normal neuro exam without cerebellar findings.  Deferred CT imaging of head. BP near baseline for patient.  Suspect  sx related to uncontrolled HTN.  No other evidence of end organ damage (good UOP, cr wnl, CXR w/o edema, lungs CTAB). PERC negative. Delta trop negative.  EKG without ischemic changes.   HEAR 3.  D/c home with provocative testing as outpatient.  Cardiology referral provided. Will call for PCP appt tomorrow to discuss stress test and management of HTN.  12:19 AM BP 142/76  Discussed strict return precautions. Pt expressed uderstanding.   Tammy Sours, MD 05/23/14 [phone]

## 2014-05-23 LAB — I-STAT TROPONIN, ED: TROPONIN I, POC: 0 ng/mL (ref 0.00–0.08)

## 2014-05-23 NOTE — ED Provider Notes (Signed)
Patient seen/examined in the Emergency Department in conjunction with Resident Physician Provider Stengel Patient reports chest pain, none at this time Exam : awake/alert, no distress, CV exam without murmurs Plan: I doubt ACS/PE/Dissection at this time  Stable for d/c home   Sharyon Cable, MD 05/23/14 0028

## 2014-05-23 NOTE — ED Notes (Signed)
Pt A&OX4,ambulatory at d/c with steady gait, no complaints, thanking staff for help, NAD

## 2014-05-23 NOTE — Discharge Instructions (Signed)

## 2014-05-24 NOTE — ED Provider Notes (Signed)
I have personally seen and examined the patient.  I have discussed the plan of care with the resident.  I have reviewed the documentation on PMH/FH/Soc. History.  I have reviewed the documentation of the resident and agree.  Pt well appearing, CP improved on my evaluation After discussion, I had very low suspicion for ACS/PE/Dissection Stable for d/c home  Sharyon Cable, MD 05/24/14 [phone]

## 2014-09-22 ENCOUNTER — Ambulatory Visit (INDEPENDENT_AMBULATORY_CARE_PROVIDER_SITE_OTHER): Payer: Self-pay | Admitting: Family Medicine

## 2014-09-22 ENCOUNTER — Encounter: Payer: Self-pay | Admitting: Family Medicine

## 2014-09-22 VITALS — BP 162/86 | HR 79 | Temp 98.2°F | Ht 65.0 in | Wt 147.0 lb

## 2014-09-22 DIAGNOSIS — Z0000 Encounter for general adult medical examination without abnormal findings: Secondary | ICD-10-CM

## 2014-09-22 DIAGNOSIS — I10 Essential (primary) hypertension: Secondary | ICD-10-CM

## 2014-09-22 DIAGNOSIS — K219 Gastro-esophageal reflux disease without esophagitis: Secondary | ICD-10-CM

## 2014-09-22 DIAGNOSIS — Z720 Tobacco use: Secondary | ICD-10-CM

## 2014-09-22 LAB — COMPREHENSIVE METABOLIC PANEL
ALT: 11 U/L (ref 0–35)
AST: 17 U/L (ref 0–37)
Albumin: 3.9 g/dL (ref 3.5–5.2)
Alkaline Phosphatase: 44 U/L (ref 39–117)
BUN: 11 mg/dL (ref 6–23)
CHLORIDE: 103 meq/L (ref 96–112)
CO2: 29 meq/L (ref 19–32)
CREATININE: 0.8 mg/dL (ref 0.50–1.10)
Calcium: 9.3 mg/dL (ref 8.4–10.5)
Glucose, Bld: 81 mg/dL (ref 70–99)
POTASSIUM: 4.2 meq/L (ref 3.5–5.3)
Sodium: 138 mEq/L (ref 135–145)
Total Bilirubin: 0.4 mg/dL (ref 0.2–1.2)
Total Protein: 6.6 g/dL (ref 6.0–8.3)

## 2014-09-22 LAB — CBC
HCT: 41.4 % (ref 36.0–46.0)
HEMOGLOBIN: 13.9 g/dL (ref 12.0–15.0)
MCH: 30.2 pg (ref 26.0–34.0)
MCHC: 33.6 g/dL (ref 30.0–36.0)
MCV: 89.8 fL (ref 78.0–100.0)
MPV: 9.6 fL (ref 8.6–12.4)
Platelets: 374 10*3/uL (ref 150–400)
RBC: 4.61 MIL/uL (ref 3.87–5.11)
RDW: 14.4 % (ref 11.5–15.5)
WBC: 8.6 10*3/uL (ref 4.0–10.5)

## 2014-09-22 LAB — LIPID PANEL
CHOL/HDL RATIO: 3.1 ratio
CHOLESTEROL: 162 mg/dL (ref 0–200)
HDL: 52 mg/dL (ref >39)
LDL Cholesterol: 86 mg/dL (ref 0–99)
Triglycerides: 122 mg/dL (ref <150)
VLDL: 24 mg/dL (ref 0–40)

## 2014-09-22 LAB — POCT GLYCOSYLATED HEMOGLOBIN (HGB A1C): Hemoglobin A1C: 5.3

## 2014-09-22 MED ORDER — NICOTINE 14 MG/24HR TD PT24: 14.0000 mg | MEDICATED_PATCH | Freq: Every day | TRANSDERMAL | Status: DC

## 2014-09-22 MED ORDER — OMEPRAZOLE 20 MG PO CPDR: 20.0000 mg | DELAYED_RELEASE_CAPSULE | Freq: Every day | ORAL | Status: DC

## 2014-09-22 MED ORDER — AMLODIPINE BESYLATE 5 MG PO TABS
5.0000 mg | ORAL_TABLET | Freq: Every day | ORAL | Status: DC
Start: 2014-09-22 — End: 2015-10-13

## 2014-09-22 NOTE — Assessment & Plan Note (Signed)
Patient's blood pressure currently not at goal. From reviewing the EMR, she had several ED visits with BPs 200s/100s. Currently not symptoms of elevated BPs.  Given poor compliance with HCTZ in the past due to diuresis, will attempt to avoid this medication. - Will start amlodipine 5mg  daily, may possibly require titration up. - Follow up in 2 weeks. - Will get A1c and lipid panel today (patient is fasting). - Discussed lifestyle modification: Patient not interested in decreasing fast food and exercising, however will consider smoking cessation.

## 2014-09-22 NOTE — Assessment & Plan Note (Signed)
Patient's chest pain is typical of uncontrolled GERD; however her issue with swallowing occasionally is concerning. - Discussed lifestyle changes (avoiding fried foods, eating slower, avoiding caffeine/chocolate, sitting upright after eating). - Will start Prilosec today. - If patient's symptoms continue, will consider GI consult for EGD.

## 2014-09-22 NOTE — Patient Instructions (Signed)
It was nice to meet you! Please start taking the amlodipine 5mg  daily; if you start feeling dizzy or light headed, please call the clinic. Congratulations on the idea to stop smoking, our clinic offers smoking cessation classes (individual) if you would like to take advantage of this in the future. I have prescribed you the nicotine patch for the number of cigarettes you smoke per day, if this is too expensive please call the clinic and I will prescribe something else. Please take the Prilosec daily, this will hopefully help with your chest discomfort with eating. Please follow up with me in 2 weeks.  Amlodipine tablets What is this medicine? AMLODIPINE (am LOE di peen) is a calcium-channel blocker. It affects the amount of calcium found in your heart and muscle cells. This relaxes your blood vessels, which can reduce the amount of work the heart has to do. This medicine is used to lower high blood pressure. It is also used to prevent chest pain. This medicine may be used for other purposes; ask your health care provider or pharmacist if you have questions. COMMON BRAND NAME(S): Norvasc What should I tell my health care provider before I take this medicine? They need to know if you have any of these conditions: -heart problems like heart failure or aortic stenosis -liver disease -an unusual or allergic reaction to amlodipine, other medicines, foods, dyes, or preservatives -pregnant or trying to get pregnant -breast-feeding How should I use this medicine? Take this medicine by mouth with a glass of water. Follow the directions on the prescription label. Take your medicine at regular intervals. Do not take more medicine than directed. Talk to your pediatrician regarding the use of this medicine in children. Special care may be needed. This medicine has been used in children as young as 6. Persons over 22 years old may have a stronger reaction to this medicine and need smaller doses. Overdosage:  If you think you have taken too much of this medicine contact a poison control center or emergency room at once. NOTE: This medicine is only for you. Do not share this medicine with others. What if I miss a dose? If you miss a dose, take it as soon as you can. If it is almost time for your next dose, take only that dose. Do not take double or extra doses. What may interact with this medicine? -herbal or dietary supplements -local or general anesthetics -medicines for high blood pressure -medicines for prostate problems -rifampin This list may not describe all possible interactions. Give your health care provider a list of all the medicines, herbs, non-prescription drugs, or dietary supplements you use. Also tell them if you smoke, drink alcohol, or use illegal drugs. Some items may interact with your medicine. What should I watch for while using this medicine? Visit your doctor or health care professional for regular check ups. Check your blood pressure and pulse rate regularly. Ask your health care professional what your blood pressure and pulse rate should be, and when you should contact him or her. This medicine may make you feel confused, dizzy or lightheaded. Do not drive, use machinery, or do anything that needs mental alertness until you know how this medicine affects you. To reduce the risk of dizzy or fainting spells, do not sit or stand up quickly, especially if you are an older patient. Avoid alcoholic drinks; they can make you more dizzy. Do not suddenly stop taking amlodipine. Ask your doctor or health care professional how you can gradually  reduce the dose. What side effects may I notice from receiving this medicine? Side effects that you should report to your doctor or health care professional as soon as possible: -allergic reactions like skin rash, itching or hives, swelling of the face, lips, or tongue -breathing problems -changes in vision or hearing -chest pain -fast,  irregular heartbeat -swelling of legs or ankles Side effects that usually do not require medical attention (report to your doctor or health care professional if they continue or are bothersome): -dry mouth -facial flushing -nausea, vomiting -stomach gas, pain -tired, weak -trouble sleeping This list may not describe all possible side effects. Call your doctor for medical advice about side effects. You may report side effects to FDA at 1-800-FDA-1088. Where should I keep my medicine? Keep out of the reach of children. Store at room temperature between 59 and 86 degrees F (15 and 30 degrees C). Protect from light. Keep container tightly closed. Throw away any unused medicine after the expiration date. NOTE: This sheet is a summary. It may not cover all possible information. If you have questions about this medicine, talk to your doctor, pharmacist, or health care provider.  2015, [employer]vier/[employer]. (2012-07-27 11:40:58)

## 2014-09-22 NOTE — Progress Notes (Signed)
Subjective: CC:New patient HPI: Patient is a 44 y.o. female presenting to clinic today to establish care.  She has not been seen by a primary care MD in numerous years, but states she has followed up with [facility] for pap smears.  Concerns today include:  1. Hypertension: Patient states that she has been hospitalized in the past for "hypertensive attacks" the last was in 08/2014 at which time her Bpwas 200s/160s. She was previously on HCTZ 25mg  however did not take this on a regular basis as she did not like the diuretic effect. She currently denies chest pain, SOB, dizziness, headache, or vision changes.   2. Sharp chest pains: Patient states that she believes this could be due to "eating too much" and acid reflux. She tells me she loves food. Her chest pains are normally centrally located and sometimes radiate to the left side, but most commonly stays centralized. The pain normally occurs every few days.  She notes eating certain foods like pizza and biscuits as a trigger. Her pain is improved once she stops eating and she burps. She very rarely remembers to take a Tums during this time but feels it helps. Additionally when she has this pain, she feels it can be difficult to swallow her food as she feels like the "food is getting stuck." This has mostly occurred with solids but she noticed it once or twice with liquids. She denies chocking/coughin with eating. Additionally, she denies any SOB, diaphoresis, arm/jaw pain with the chest pain. She is worried as her uncle had esophagitis and had a long history of drinking alcohol and died of cancer.   Gyn History:  LMP 09/11/14 x 5 days. Regular every month. No vaginal discharge/pruritus Not high risk for STD  PMHx:  HTN Mitral valve prolapse (believes she used to be on medication for this but cannot recall, per ED note from 2010, patient never placed on medication for this). 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 Has tried cold [place] in the past without improvement. Interested in using the nicotine patch vs gum. Drinks approximately 1 bottle of moscato every 2 weeks (social drinker). Has used marijuana in the past but currently denies any use. Currently in a monogamous relationship with a man x 2.28yrs (feels safe). Works with behavioral health with adolescents Does not exercise on a regular basis. REALLY loves food and eats fast food at least once a day- does NOT want to give this up. Does state she tries to keep her stress and salt intake (howerver later acknowledges that fast food is high in salt.).  Objective: Office vital signs reviewed. BP 169/97 mmHg  Pulse 79  Temp(Src) 98.2 F (36.8 C) (Oral)  Ht 5\' 5"  (1.651 m)  Wt 147 lb (66.679 kg)  BMI 24.46 kg/m2  LMP 09/14/2014  Repeat manuel BP: 162/97 Physical Examination:  General: Pleasant, alert, well- nourished, NAD HEENT: Atraumatic, normocephalic    Neck: No masses palpated. No thyromegaly.  No LAD    Ears: TMs intact, normal light reflex, no erythema, no bulging    Eyes: PERRLA, sclera anicteric    Nose: nasal turbinates moist    Throat: MMM, oropharynx clear without erythema  Cardio: RRR, S1S2 heard, no murmurs, rubs or gallops  appreciated Pulm: CTAB, no wheezes, rhonchi or rales Abdomen: soft, NT/ND,+BS x4 Extremities: WWP, No edema, cyanosis or clubbing; +2 pulses bilaterally MSK: Normal gait and station Skin: dry, intact, no rashes or lesions Neuro: Strength and sensation grossly intact  Assessment/Plan: Please see Problem List and After Visit Summary  [name], MD PGY-1, [place]

## 2014-09-22 NOTE — Assessment & Plan Note (Signed)
Patient currently interested in quitting. Quitting cold [place] was not successful in the past. - Discussed the nicotine patch, will start at 14mg /24hrs - Discussed the smoking cessation counseling here at the clinic. - If necessary, could add nicotine gum for break-through cravings and/or Wellbutrin as adjuctive therapy.

## 2014-10-20 ENCOUNTER — Ambulatory Visit (HOSPITAL_COMMUNITY): Payer: Self-pay

## 2014-10-23 ENCOUNTER — Telehealth: Payer: Self-pay | Admitting: Family Medicine

## 2014-10-23 NOTE — Telephone Encounter (Signed)
Pt called and would like to speak to the nurse for Dr. Lorenso Courier. She is still spotting and is concerned. jw

## 2014-10-23 NOTE — Telephone Encounter (Signed)
LMP 1/11 with "spotting" ever since 2/1. Appointment scheduled for 2/15.

## 2014-10-27 ENCOUNTER — Ambulatory Visit: Payer: Self-pay | Admitting: Family Medicine

## 2015-01-26 ENCOUNTER — Emergency Department (HOSPITAL_COMMUNITY)
Admission: EM | Admit: 2015-01-26 | Discharge: 2015-01-26 | Disposition: A | Discharge status: Home / Self Care | Payer: Self-pay | Attending: Emergency Medicine | Admitting: Emergency Medicine

## 2015-01-26 ENCOUNTER — Emergency Department (HOSPITAL_COMMUNITY): Payer: Self-pay

## 2015-01-26 ENCOUNTER — Encounter (HOSPITAL_COMMUNITY): Payer: Self-pay | Admitting: *Deleted

## 2015-01-26 DIAGNOSIS — Y9389 Activity, other specified: Secondary | ICD-10-CM | POA: Insufficient documentation

## 2015-01-26 DIAGNOSIS — I10 Essential (primary) hypertension: Secondary | ICD-10-CM | POA: Insufficient documentation

## 2015-01-26 DIAGNOSIS — W108XXA Fall (on) (from) other stairs and steps, initial encounter: Secondary | ICD-10-CM | POA: Insufficient documentation

## 2015-01-26 DIAGNOSIS — Y9289 Other specified places as the place of occurrence of the external cause: Secondary | ICD-10-CM | POA: Insufficient documentation

## 2015-01-26 DIAGNOSIS — Z79899 Other long term (current) drug therapy: Secondary | ICD-10-CM | POA: Insufficient documentation

## 2015-01-26 DIAGNOSIS — Z720 Tobacco use: Secondary | ICD-10-CM | POA: Insufficient documentation

## 2015-01-26 DIAGNOSIS — S8991XA Unspecified injury of right lower leg, initial encounter: Secondary | ICD-10-CM | POA: Insufficient documentation

## 2015-01-26 DIAGNOSIS — Z7982 Long term (current) use of aspirin: Secondary | ICD-10-CM | POA: Insufficient documentation

## 2015-01-26 DIAGNOSIS — M25561 Pain in right knee: Secondary | ICD-10-CM

## 2015-01-26 DIAGNOSIS — Y998 Other external cause status: Secondary | ICD-10-CM | POA: Insufficient documentation

## 2015-01-26 NOTE — ED Provider Notes (Signed)
CSN: [id number]     Arrival date & time 01/26/15  0510 History   First MD Initiated Contact with Patient 01/26/15 0559     Chief Complaint  Patient presents with  . Knee Pain     (Consider location/radiation/quality/duration/timing/severity/associated sxs/prior Treatment) HPI Comments: Patient presents emergency department with chief complaint of right knee pain. She states that she missed a step and slid down 5 stairs yesterday. She states that when she did this she twisted her knee. She reports pain with walking since the injury. She has been able to ambulate with an antalgic gait. She has tried taking ibuprofen with good relief. She reports using a knee sleeve at home. She has a history of anterior cruciate ligament tear in the affected knee. His aggravated with palpation and movement. It is improved with rest.  The history is provided by the patient. No language interpreter was used.    Past Medical History  Diagnosis Date  . Mitral valve prolapse   . Hypertension    Past Surgical History  Procedure Laterality Date  . Appendectomy    . Uterine tumor removal  2011   History reviewed. No pertinent family history. History  Substance Use Topics  . Smoking status: Current Every Day Smoker -- 0.50 packs/day    Types: Cigarettes  . Smokeless tobacco: Never Used  . Alcohol Use: No   OB History    No data available     Review of Systems  Constitutional: Negative for fever and chills.  Respiratory: Negative for shortness of breath.   Cardiovascular: Negative for chest pain.  Gastrointestinal: Negative for nausea, vomiting, diarrhea and constipation.  Genitourinary: Negative for dysuria.  Musculoskeletal: Positive for joint swelling and arthralgias.      Allergies  Review of patient's allergies indicates no known allergies.  Home Medications   Prior to Admission medications   Medication Sig Start Date End Date Taking? Authorizing Provider  amLODipine (NORVASC) 5 MG  tablet Take 1 tablet (5 mg total) by mouth daily. 09/22/14   [name], MD  aspirin EC 81 MG tablet Take 81 mg by mouth daily.    Historical Provider, MD  HYDROcodone-acetaminophen (NORCO/VICODIN) 5-325 MG per tablet Take 1 tablet by mouth daily.    Historical Provider, MD  Multiple Vitamins-Minerals (HAIR/SKIN/NAILS) TABS Take 3 tablets by mouth daily.     Historical Provider, MD  nicotine (NICODERM CQ - DOSED IN MG/24 HOURS) 14 mg/24hr patch Place 1 patch (14 mg total) onto the skin daily. 09/22/14   [name], MD  omeprazole (PRILOSEC) 20 MG capsule Take 1 capsule (20 mg total) by mouth daily. 09/22/14   [name], MD   BP 169/110 mmHg  Pulse 74  Temp(Src) 98.4 F (36.9 C) (Oral)  Resp 16  Ht 5\' 5"  (1.651 m)  Wt 145 lb (65.772 kg)  BMI 24.13 kg/m2  SpO2 100%  LMP 01/11/2015 Physical Exam  Constitutional: She is oriented to person, place, and time. She appears well-developed and well-nourished.  HENT:  Head: Normocephalic and atraumatic.  Eyes: Conjunctivae and EOM are normal.  Neck: Normal range of motion.  Cardiovascular: Normal rate.   Pulmonary/Chest: Effort normal.  Abdominal: She exhibits no distension.  Musculoskeletal: Normal range of motion.  Right knee mildly tender to palpation along the medial and lateral joint lines, no bony abnormality or deformity, range of motion strength limited secondary to pain, mild swelling, no evidence of septic joint or DVT, joint stability testing limited secondary to pain.  Neurological: She is alert and oriented to person, place, and time.  Skin: Skin is dry.  Psychiatric: She has a normal mood and affect. Her behavior is normal. Judgment and thought content normal.  Nursing note and vitals reviewed.   ED Course  Procedures (including critical care time) Labs Review Labs Reviewed - No data to display  Imaging Review Dg Knee Complete 4 Views Right  01/26/2015   CLINICAL DATA:  [place] down basement stairs yesterday,  history of ACL injury. Knee pain.  EXAM: RIGHT KNEE - COMPLETE 4+ VIEW  COMPARISON:  None.  FINDINGS: No acute fracture deformity or dislocation. Joint space intact without erosions. No destructive bony lesions. Soft tissue planes are not suspicious.  IMPRESSION: Negative.   Electronically Signed   By: Elon Alas   On: 01/26/2015 06:30     EKG Interpretation None      MDM   Final diagnoses:  Knee pain, acute, right   Patient with right knee injury. Plain films are negative. Will give knee brace and crutches. Concern for soft tissue injury. Will recommend orthopedic follow-up. Patient understands and agrees with plan. She is stable and ready for discharge. She states that she has ibuprofen 800 at home for pain.    Montine Circle, PA-C 01/26/15 B[place], MD 01/26/15 [phone]

## 2015-01-26 NOTE — Discharge Instructions (Signed)

## 2015-01-26 NOTE — ED Notes (Signed)
Pt. Left with all belongings and refused wheelchair 

## 2015-01-26 NOTE — ED Notes (Signed)
Pt. Fell down her basement stairs. She states she slid down about 5 stairs before she stopped herself. Pt. C/o of right knee pain.

## 2015-01-30 ENCOUNTER — Other Ambulatory Visit (HOSPITAL_COMMUNITY): Payer: Self-pay | Admitting: Orthopaedic Surgery

## 2015-01-30 DIAGNOSIS — M25561 Pain in right knee: Secondary | ICD-10-CM

## 2015-02-13 ENCOUNTER — Ambulatory Visit (HOSPITAL_COMMUNITY): Admission: RE | Admit: 2015-02-13 | Payer: Self-pay | Source: Ambulatory Visit | Admitting: Orthopaedic Surgery

## 2015-02-20 ENCOUNTER — Telehealth: Payer: Self-pay | Admitting: Radiology

## 2015-02-20 NOTE — Telephone Encounter (Signed)
Emailed Regan at [facility] office, unable to reach pt to sched, tried 3x

## 2015-10-13 ENCOUNTER — Other Ambulatory Visit: Payer: Self-pay | Admitting: *Deleted

## 2015-10-13 DIAGNOSIS — I10 Essential (primary) hypertension: Secondary | ICD-10-CM

## 2015-10-13 MED ORDER — AMLODIPINE BESYLATE 5 MG PO TABS: 5.0000 mg | ORAL_TABLET | Freq: Every day | ORAL | Status: DC

## 2015-10-13 NOTE — Telephone Encounter (Signed)
Refilled for 2 month supply, please have the patient f/u with me.  Thanks, [name], MD [facility] Resident  10/13/2015, 11:29 AM

## 2015-10-15 NOTE — Telephone Encounter (Signed)
Patient informed, appointment scheduled for 3/10 with PCP.

## 2015-11-20 ENCOUNTER — Ambulatory Visit: Payer: Self-pay | Admitting: Family Medicine

## 2015-11-23 ENCOUNTER — Ambulatory Visit: Payer: Self-pay | Admitting: Family Medicine

## 2015-11-26 ENCOUNTER — Ambulatory Visit (INDEPENDENT_AMBULATORY_CARE_PROVIDER_SITE_OTHER): Payer: Self-pay | Admitting: Family Medicine

## 2015-11-30 ENCOUNTER — Encounter: Payer: Self-pay | Admitting: Family Medicine

## 2015-11-30 ENCOUNTER — Ambulatory Visit (INDEPENDENT_AMBULATORY_CARE_PROVIDER_SITE_OTHER): Payer: Self-pay | Admitting: Family Medicine

## 2015-11-30 ENCOUNTER — Other Ambulatory Visit (HOSPITAL_COMMUNITY)
Admission: RE | Admit: 2015-11-30 | Discharge: 2015-11-30 | Disposition: A | Discharge status: Home / Self Care | Payer: Self-pay | Source: Ambulatory Visit | Attending: Family Medicine | Admitting: Family Medicine

## 2015-11-30 VITALS — BP 191/107 | HR 84 | Temp 98.5°F | Ht 65.0 in | Wt 135.2 lb

## 2015-11-30 DIAGNOSIS — R61 Generalized hyperhidrosis: Secondary | ICD-10-CM

## 2015-11-30 DIAGNOSIS — N923 Ovulation bleeding: Secondary | ICD-10-CM

## 2015-11-30 DIAGNOSIS — Z1151 Encounter for screening for human papillomavirus (HPV): Secondary | ICD-10-CM | POA: Insufficient documentation

## 2015-11-30 DIAGNOSIS — Z124 Encounter for screening for malignant neoplasm of cervix: Secondary | ICD-10-CM

## 2015-11-30 DIAGNOSIS — N939 Abnormal uterine and vaginal bleeding, unspecified: Secondary | ICD-10-CM

## 2015-11-30 DIAGNOSIS — I10 Essential (primary) hypertension: Secondary | ICD-10-CM

## 2015-11-30 DIAGNOSIS — Z720 Tobacco use: Secondary | ICD-10-CM

## 2015-11-30 DIAGNOSIS — Z01411 Encounter for gynecological examination (general) (routine) with abnormal findings: Secondary | ICD-10-CM | POA: Insufficient documentation

## 2015-11-30 DIAGNOSIS — Z114 Encounter for screening for human immunodeficiency virus [HIV]: Secondary | ICD-10-CM

## 2015-11-30 LAB — COMPREHENSIVE METABOLIC PANEL
ALT: 10 U/L (ref 6–29)
AST: 17 U/L (ref 10–35)
Albumin: 4.4 g/dL (ref 3.6–5.1)
Alkaline Phosphatase: 60 U/L (ref 33–115)
BILIRUBIN TOTAL: 0.5 mg/dL (ref 0.2–1.2)
BUN: 12 mg/dL (ref 7–25)
CO2: 28 mmol/L (ref 20–31)
CREATININE: 0.8 mg/dL (ref 0.50–1.10)
Calcium: 9.3 mg/dL (ref 8.6–10.2)
Chloride: 101 mmol/L (ref 98–110)
Glucose, Bld: 90 mg/dL (ref 65–99)
Potassium: 4.4 mmol/L (ref 3.5–5.3)
SODIUM: 136 mmol/L (ref 135–146)
TOTAL PROTEIN: 7.4 g/dL (ref 6.1–8.1)

## 2015-11-30 LAB — TSH: TSH: 1.43 mIU/L

## 2015-11-30 LAB — LIPID PANEL
Cholesterol: 170 mg/dL (ref 125–200)
HDL: 64 mg/dL (ref >=46)
LDL Cholesterol: 85 mg/dL (ref <130)
Total CHOL/HDL Ratio: 2.7 Ratio (ref <=5.0)
Triglycerides: 107 mg/dL (ref <150)
VLDL: 21 mg/dL (ref <30)

## 2015-11-30 LAB — CBC WITH DIFFERENTIAL/PLATELET
BASOS ABS: 0.2 10*3/uL — AB (ref 0.0–0.1)
Basophils Relative: 2 % — ABNORMAL HIGH (ref 0–1)
EOS ABS: 0.1 10*3/uL (ref 0.0–0.7)
Eosinophils Relative: 1 % (ref 0–5)
HEMATOCRIT: 46.5 % — AB (ref 36.0–46.0)
HEMOGLOBIN: 14.9 g/dL (ref 12.0–15.0)
LYMPHS ABS: 4 10*3/uL (ref 0.7–4.0)
LYMPHS PCT: 38 % (ref 12–46)
MCH: 29.7 pg (ref 26.0–34.0)
MCHC: 32 g/dL (ref 30.0–36.0)
MCV: 92.6 fL (ref 78.0–100.0)
MPV: 9.7 fL (ref 8.6–12.4)
Monocytes Absolute: 1 10*3/uL (ref 0.1–1.0)
Monocytes Relative: 9 % (ref 3–12)
NEUTROS ABS: 5.3 10*3/uL (ref 1.7–7.7)
NEUTROS PCT: 50 % (ref 43–77)
Platelets: 413 10*3/uL — ABNORMAL HIGH (ref 150–400)
RBC: 5.02 MIL/uL (ref 3.87–5.11)
RDW: 14.7 % (ref 11.5–15.5)
WBC: 10.6 10*3/uL — ABNORMAL HIGH (ref 4.0–10.5)

## 2015-11-30 LAB — HIV ANTIBODY (ROUTINE TESTING W REFLEX): HIV 1&2 Ab, 4th Generation: NONREACTIVE

## 2015-11-30 MED ORDER — AMLODIPINE BESYLATE 5 MG PO TABS: 5.0000 mg | ORAL_TABLET | Freq: Every day | ORAL | Status: DC

## 2015-11-30 NOTE — Patient Instructions (Signed)
Pap today Checking labs - liver, kidney, blood counts, thyroid, HIV, cholesterol  Return for nurse blood pressure check in a few weeks one day when you've taken your medication  Scheduling pelvic ultrasound  Schedule an appointment in GYN procedures clinic here at [facility] for an endometrial biopsy  Be well, Dr. Ardelia Mems   Health Maintenance, Female Adopting a healthy lifestyle and getting preventive care can go a long way to promote health and wellness. Talk with your health care provider about what schedule of regular examinations is right for you. This is a good chance for you to check in with your provider about disease prevention and staying healthy. In between checkups, there are plenty of things you can do on your own. Experts have done a lot of research about which lifestyle changes and preventive measures are most likely to keep you healthy. Ask your health care provider for more information. WEIGHT AND DIET  Eat a healthy diet  Be sure to include plenty of vegetables, fruits, low-fat dairy products, and lean protein.  Do not eat a lot of foods high in solid fats, added sugars, or salt.  Get regular exercise. This is one of the most important things you can do for your health.  Most adults should exercise for at least 150 minutes each week. The exercise should increase your heart rate and make you sweat (moderate-intensity exercise).  Most adults should also do strengthening exercises at least twice a week. This is in addition to the moderate-intensity exercise.  Maintain a healthy weight  Body mass index (BMI) is a measurement that can be used to identify possible weight problems. It estimates body fat based on height and weight. Your health care provider can help determine your BMI and help you achieve or maintain a healthy weight.  For females 38 years of age and older:   A BMI below 18.5 is considered underweight.  A BMI of 18.5 to 24.9 is normal.  A  BMI of 25 to 29.9 is considered overweight.  A BMI of 30 and above is considered obese.  Watch levels of cholesterol and blood lipids  You should start having your blood tested for lipids and cholesterol at 47 years of age, then have this test every 5 years.  You may need to have your cholesterol levels checked more often if:  Your lipid or cholesterol levels are high.  You are older than 47 years of age.  You are at high risk for heart disease.  CANCER SCREENING   Lung Cancer  Lung cancer screening is recommended for adults 46-30 years old who are at high risk for lung cancer because of a history of smoking.  A yearly low-dose CT scan of the lungs is recommended for people who:  Currently smoke.  Have quit within the past 15 years.  Have at least a 30-pack-year history of smoking. A pack year is smoking an average of one pack of cigarettes a day for 1 year.  Yearly screening should continue until it has been 15 years since you quit.  Yearly screening should stop if you develop a health problem that would prevent you from having lung cancer treatment.  Breast Cancer  Practice breast self-awareness. This means understanding how your breasts normally appear and feel.  It also means doing regular breast self-exams. Let your health care provider know about any changes, no matter how small.  If you are in your 20s or 30s, you should have a clinical breast exam (CBE)  by a health care provider every 1-3 years as part of a regular health exam.  If you are 30 or older, have a CBE every year. Also consider having a breast X-ray (mammogram) every year.  If you have a family history of breast cancer, talk to your health care provider about genetic screening.  If you are at high risk for breast cancer, talk to your health care provider about having an MRI and a mammogram every year.  Breast cancer gene (BRCA) assessment is recommended for women who have family members with  BRCA-related cancers. BRCA-related cancers include:  Breast.  Ovarian.  Tubal.  Peritoneal cancers.  Results of the assessment will determine the need for genetic counseling and BRCA1 and BRCA2 testing. Cervical Cancer Your health care provider may recommend that you be screened regularly for cancer of the pelvic organs (ovaries, uterus, and vagina). This screening involves a pelvic examination, including checking for microscopic changes to the surface of your cervix (Pap test). You may be encouraged to have this screening done every 3 years, beginning at age 25.  For women ages 17-65, health care providers may recommend pelvic exams and Pap testing every 3 years, or they may recommend the Pap and pelvic exam, combined with testing for human papilloma virus (HPV), every 5 years. Some types of HPV increase your risk of cervical cancer. Testing for HPV may also be done on women of any age with unclear Pap test results.  Other health care providers may not recommend any screening for nonpregnant women who are considered low risk for pelvic cancer and who do not have symptoms. Ask your health care provider if a screening pelvic exam is right for you.  If you have had past treatment for cervical cancer or a condition that could lead to cancer, you need Pap tests and screening for cancer for at least 20 years after your treatment. If Pap tests have been discontinued, your risk factors (such as having a new sexual partner) need to be reassessed to determine if screening should resume. Some women have medical problems that increase the chance of getting cervical cancer. In these cases, your health care provider may recommend more frequent screening and Pap tests. Colorectal Cancer  This type of cancer can be detected and often prevented.  Routine colorectal cancer screening usually begins at 47 years of age and continues through 47 years of age.  Your health care provider may recommend screening at  an earlier age if you have risk factors for colon cancer.  Your health care provider may also recommend using home test kits to check for hidden blood in the stool.  A small camera at the end of a tube can be used to examine your colon directly (sigmoidoscopy or colonoscopy). This is done to check for the earliest forms of colorectal cancer.  Routine screening usually begins at age 108.  Direct examination of the colon should be repeated every 5-10 years through 47 years of age. However, you may need to be screened more often if early forms of precancerous polyps or small growths are found. Skin Cancer  Check your skin from head to toe regularly.  Tell your health care provider about any new moles or changes in moles, especially if there is a change in a mole's shape or color.  Also tell your health care provider if you have a mole that is larger than the size of a pencil eraser.  Always use sunscreen. Apply sunscreen liberally and repeatedly throughout the  day.  Protect yourself by wearing long sleeves, pants, a wide-brimmed hat, and sunglasses whenever you are outside. HEART DISEASE, DIABETES, AND HIGH BLOOD PRESSURE   High blood pressure causes heart disease and increases the risk of stroke. High blood pressure is more likely to develop in:  People who have blood pressure in the high end of the normal range (130-139/85-89 mm Hg).  People who are overweight or obese.  People who are African American.  If you are 48-63 years of age, have your blood pressure checked every 3-5 years. If you are 42 years of age or older, have your blood pressure checked every year. You should have your blood pressure measured twice--once when you are at a hospital or clinic, and once when you are not at a hospital or clinic. Record the average of the two measurements. To check your blood pressure when you are not at a hospital or clinic, you can use:  An automated blood pressure machine at a  pharmacy.  A home blood pressure monitor.  If you are between 47 years and 26 years old, ask your health care provider if you should take aspirin to prevent strokes.  Have regular diabetes screenings. This involves taking a blood sample to check your fasting blood sugar level.  If you are at a normal weight and have a low risk for diabetes, have this test once every three years after 47 years of age.  If you are overweight and have a high risk for diabetes, consider being tested at a younger age or more often. PREVENTING INFECTION  Hepatitis B  If you have a higher risk for hepatitis B, you should be screened for this virus. You are considered at high risk for hepatitis B if:  You were born in a country where hepatitis B is common. Ask your health care provider which countries are considered high risk.  Your parents were born in a high-risk country, and you have not been immunized against hepatitis B (hepatitis B vaccine).  You have HIV or AIDS.  You use needles to inject street drugs.  You live with someone who has hepatitis B.  You have had sex with someone who has hepatitis B.  You get hemodialysis treatment.  You take certain medicines for conditions, including cancer, organ transplantation, and autoimmune conditions. Hepatitis C  Blood testing is recommended for:  Everyone born from [birth year] through 1967.  Anyone with known risk factors for hepatitis C. Sexually transmitted infections (STIs)  You should be screened for sexually transmitted infections (STIs) including gonorrhea and chlamydia if:  You are sexually active and are younger than 47 years of age.  You are older than 47 years of age and your health care provider tells you that you are at risk for this type of infection.  Your sexual activity has changed since you were last screened and you are at an increased risk for chlamydia or gonorrhea. Ask your health care provider if you are at risk.  If you do not  have HIV, but are at risk, it may be recommended that you take a prescription medicine daily to prevent HIV infection. This is called pre-exposure prophylaxis (PrEP). You are considered at risk if:  You are sexually active and do not regularly use condoms or know the HIV status of your partner(s).  You take drugs by injection.  You are sexually active with a partner who has HIV. Talk with your health care provider about whether you are at high risk of  being infected with HIV. If you choose to begin PrEP, you should first be tested for HIV. You should then be tested every 3 months for as long as you are taking PrEP.  PREGNANCY   If you are premenopausal and you may become pregnant, ask your health care provider about preconception counseling.  If you may become pregnant, take 400 to 800 micrograms (mcg) of folic acid every day.  If you want to prevent pregnancy, talk to your health care provider about birth control (contraception). OSTEOPOROSIS AND MENOPAUSE   Osteoporosis is a disease in which the bones lose minerals and strength with aging. This can result in serious bone fractures. Your risk for osteoporosis can be identified using a bone density scan.  If you are 67 years of age or older, or if you are at risk for osteoporosis and fractures, ask your health care provider if you should be screened.  Ask your health care provider whether you should take a calcium or vitamin D supplement to lower your risk for osteoporosis.  Menopause may have certain physical symptoms and risks.  Hormone replacement therapy may reduce some of these symptoms and risks. Talk to your health care provider about whether hormone replacement therapy is right for you.  HOME CARE INSTRUCTIONS   Schedule regular health, dental, and eye exams.  Stay current with your immunizations.   Do not use any tobacco products including cigarettes, chewing tobacco, or electronic cigarettes.  If you are pregnant, do not  drink alcohol.  If you are breastfeeding, limit how much and how often you drink alcohol.  Limit alcohol intake to no more than 1 drink per day for nonpregnant women. One drink equals 12 ounces of beer, 5 ounces of wine, or 1 ounces of hard liquor.  Do not use street drugs.  Do not share needles.  Ask your health care provider for help if you need support or information about quitting drugs.  Tell your health care provider if you often feel depressed.  Tell your health care provider if you have ever been abused or do not feel safe at home.   This information is not intended to replace advice given to you by your health care provider. Make sure you discuss any questions you have with your health care provider.   Document Released: 03/14/2011 Document Revised: 09/19/2014 Document Reviewed: 07/31/2013 Elsevier Interactive Patient Education [employer].

## 2015-11-30 NOTE — Progress Notes (Signed)
Date of Visit: 11/30/2015   HPI:  Patient presents today for a well woman exam.   Concerns today: see below Periods: since January periods have been irregular, including intermenstrual spotting Contraception: nothing Pelvic symptoms: no discharge or pelvic pain Sexual activity: one female partner over the last 4 years STD Screening: declines, ok with HIV test today Pap smear status: due today Diet: eats well Smoking: 1/2 pack per day. Interested in quitting. Alcohol: [place] 1 cup every other night Drugs: none Mood: some mood swings, overall mood stable Dentist: has to pay out of pocket  Declines flu shot today  Patient also endorses night sweats and mood swings. Has been using an over the counter menopause medicine that is apparently a natural herb, patient thinks it is estrogen?  Patient believes blood pressure is elevated bc she did not take her medicine this morning. Reports she "drank a cup of coffee, smoked 2 cigarettes, and came straight here".  ROS: See HPI  [place]:  Cancers in family: none  PHYSICAL EXAM: BP 191/107 mmHg  Pulse 84  Temp(Src) 98.5 F (36.9 C) (Oral)  Ht 5\' 5"  (1.651 m)  Wt 135 lb 3.2 oz (61.326 kg)  BMI 22.50 kg/m2 Gen: NAD, pleasant, cooperative HEENT: NCAT, PERRL, no palpable thyromegaly or anterior cervical lymphadenopathy Heart: RRR, no murmurs Lungs: CTAB, NWOB Abdomen: soft, nontender to palpation Neuro: grossly nonfocal, speech normal GU: normal appearing external genitalia without lesions. Vagina is moist with white discharge. Cervix normal in appearance. No cervical motion tenderness or tenderness on bimanual exam. No adnexal masses.   ASSESSMENT/PLAN:  Health maintenance:  -STD screening: declines. Consents to routine HIV test -pap smear: HPV & cytology cotest done today -lipid screening: CMET and lipids today -immunizations: declines flu vaccine -handout given on health maintenance topics  Intermenstrual bleeding Potentially  related to perimenopause, but with new onset intermenstrual bleeding warrants evaluation of endometrium. Plan: - pelvic ultrasound - schedule in colposcopy clinic for endometrial bx   Essential hypertension, benign Uncontrolled but did not take medications this morning. Not acutely symptomatic. Checking labwork today. Follow up in a few weeks for blood pressure recheck once she has taken her medications.  Tobacco abuse Encouraged cessation.  Night sweats Likely perimenopausal but as we are checking labs today will add CBC diff to reassure against hematologic etiology. Also check TSH.    FOLLOW UP: Follow up in a few weeks for endometrial biopsy   [place] J. Ardelia Mems, [place]

## 2015-12-01 LAB — CYTOLOGY - PAP

## 2015-12-02 ENCOUNTER — Ambulatory Visit (HOSPITAL_COMMUNITY)
Admission: RE | Admit: 2015-12-02 | Discharge: 2015-12-02 | Disposition: A | Discharge status: Home / Self Care | Payer: Self-pay | Source: Ambulatory Visit | Attending: Family Medicine | Admitting: Family Medicine

## 2015-12-02 DIAGNOSIS — N923 Ovulation bleeding: Secondary | ICD-10-CM | POA: Insufficient documentation

## 2015-12-02 DIAGNOSIS — D251 Intramural leiomyoma of uterus: Secondary | ICD-10-CM | POA: Insufficient documentation

## 2015-12-02 DIAGNOSIS — D252 Subserosal leiomyoma of uterus: Secondary | ICD-10-CM | POA: Insufficient documentation

## 2015-12-02 DIAGNOSIS — N7011 Chronic salpingitis: Secondary | ICD-10-CM | POA: Insufficient documentation

## 2015-12-02 DIAGNOSIS — N838 Other noninflammatory disorders of ovary, fallopian tube and broad ligament: Secondary | ICD-10-CM | POA: Insufficient documentation

## 2015-12-03 DIAGNOSIS — N923 Ovulation bleeding: Secondary | ICD-10-CM | POA: Insufficient documentation

## 2015-12-03 DIAGNOSIS — R61 Generalized hyperhidrosis: Secondary | ICD-10-CM | POA: Insufficient documentation

## 2015-12-03 NOTE — Assessment & Plan Note (Signed)
Encouraged cessation.

## 2015-12-03 NOTE — Assessment & Plan Note (Signed)
Potentially related to perimenopause, but with new onset intermenstrual bleeding warrants evaluation of endometrium. Plan: - pelvic ultrasound - schedule in colposcopy clinic for endometrial bx

## 2015-12-03 NOTE — Assessment & Plan Note (Signed)
Uncontrolled but did not take medications this morning. Not acutely symptomatic. Checking labwork today. Follow up in a few weeks for blood pressure recheck once she has taken her medications.

## 2015-12-03 NOTE — Assessment & Plan Note (Addendum)
Likely perimenopausal but as we are checking labs today will add CBC diff to reassure against hematologic etiology. Also check TSH.

## 2015-12-09 ENCOUNTER — Telehealth: Payer: Self-pay | Admitting: Family Medicine

## 2015-12-09 DIAGNOSIS — D72829 Elevated white blood cell count, unspecified: Secondary | ICD-10-CM

## 2015-12-09 NOTE — Telephone Encounter (Signed)
Please let the patient know that her pap smear with Dr. Ardelia Mems was normal.  Best, [name], MD [facility] Family Medicine Resident

## 2015-12-09 NOTE — Telephone Encounter (Signed)
Pt informed. Wants to know what is going to be done about her fibroids. Deseree Kennon Holter, CMA

## 2015-12-09 NOTE — Addendum Note (Signed)
Addended by: Leeanne Rio on: 12/09/2015 05:19 PM   Modules accepted: Orders

## 2015-12-09 NOTE — Telephone Encounter (Signed)
Called patient to discuss results. Summary of our conversation is as follows:  1. Lots of fibroids. Recommend follow up with OB/GYN after she has endometrial biopsy. This could explain her bleeding but endometrial eval still warranted. Could talk to GYN about surgical vs nonsurgical management of fibroids.  2. Hydrosalpinx, chronic - recommend she gets tested for STDs when she is seen for her endometiral biopsy. She had declined this at recent appointment.  3. Elevated WBC count - mildly elevated, recommend repeat, to schedule lab visit  4. Elevated BP - needs follow up with PCP for this. Lipids looked good but when calclulating ASCVD risk score, if BP remains elevated she would benefit from statin. She will schedule appointment with PCP.  FYI to PCP  Leeanne Rio, MD

## 2015-12-14 ENCOUNTER — Ambulatory Visit (INDEPENDENT_AMBULATORY_CARE_PROVIDER_SITE_OTHER): Payer: Self-pay | Admitting: Family Medicine

## 2015-12-14 ENCOUNTER — Other Ambulatory Visit (HOSPITAL_COMMUNITY)
Admission: RE | Admit: 2015-12-14 | Discharge: 2015-12-14 | Disposition: A | Discharge status: Home / Self Care | Payer: Self-pay | Source: Ambulatory Visit | Attending: Family Medicine | Admitting: Family Medicine

## 2015-12-14 ENCOUNTER — Encounter: Payer: Self-pay | Admitting: Family Medicine

## 2015-12-14 VITALS — BP 140/90 | HR 74 | Temp 98.6°F | Wt 131.0 lb

## 2015-12-14 DIAGNOSIS — N760 Acute vaginitis: Secondary | ICD-10-CM | POA: Insufficient documentation

## 2015-12-14 DIAGNOSIS — Z113 Encounter for screening for infections with a predominantly sexual mode of transmission: Secondary | ICD-10-CM

## 2015-12-14 DIAGNOSIS — N7011 Chronic salpingitis: Secondary | ICD-10-CM | POA: Insufficient documentation

## 2015-12-14 LAB — POCT URINE PREGNANCY: Preg Test, Ur: NEGATIVE

## 2015-12-14 NOTE — Assessment & Plan Note (Addendum)
GC/chlamydia/trichomonas collected today to rule out infection/chronic PID Clinically without signs/sx of PID Follow up as scheduled for endometrial biopsy

## 2015-12-14 NOTE — Progress Notes (Signed)
Date of Visit: 12/14/2015   HPI:  Patient presents today to get tested for STD's. I had seen her recently for a complete physical. At that visit she noted intermenstrual bleeding, so we obtained a pelvic ultrasound. Ultrasound showed many uterine fibroids and a chronic R hydrosalpinx so I recommended she get tested for STDs to rule out chronic PID as cause for the hydrosalpinx.  She presents today to get the STD testing done. Denies having abnormal vaginal discharge. She has an appointment on Thursday here for endometrial biopsy. Period is just now ending.  ROS: See HPI.  [place]: history of hypertension, GERD, tobacco abuse  PHYSICAL EXAM: BP 140/90 mmHg  Pulse 74  Temp(Src) 98.6 F (37 C) (Oral)  Wt 131 lb (59.421 kg) Gen: NAD, pleasant, cooperative GU: normal appearing external genitalia without lesions. Vagina is moist with menstrual blood. Cervix normal in appearance. No cervical motion tenderness or tenderness on bimanual exam. No adnexal masses.   ASSESSMENT/PLAN:  Hydrosalpinx GC/chlamydia/trichomonas collected today to rule out infection/chronic PID Clinically without signs/sx of PID Follow up as scheduled for endometrial biopsy   FOLLOW UP: Follow up in 3 days for endometrial biopsy as scheduled.  Cheryl Carlson, [place]

## 2015-12-14 NOTE — Patient Instructions (Signed)
Screening for STDs today I will call you with your results Keep appointment for later this week for endometrial biopsy  Be well, Dr. Ardelia Mems

## 2015-12-15 ENCOUNTER — Telehealth: Payer: Self-pay | Admitting: Family Medicine

## 2015-12-15 LAB — CERVICOVAGINAL ANCILLARY ONLY
Chlamydia: NEGATIVE
Neisseria Gonorrhea: NEGATIVE
TRICH (WINDOWPATH): POSITIVE — AB

## 2015-12-15 NOTE — Telephone Encounter (Signed)
Pt was seen yesterday 12/14/15 and had labs done. Please call patient with the results. jw

## 2015-12-16 MED ORDER — METRONIDAZOLE 500 MG PO TABS: 2000.0000 mg | ORAL_TABLET | Freq: Once | ORAL | Status: DC

## 2015-12-16 NOTE — Telephone Encounter (Signed)
Patient recently seen by Dr. Ardelia Mems for STD testing. She was noted to have trichomonas on testing. Called to discuss implications, precautions, and treatment. Prescribed Flagyl 2g as a single dose into her pharmacy. Discussed disulfiram reaction.   Patient scheduled to have an endometrial biopsy tomorrow due to intermenstrual bleeding. Unsure if the provider would like to hold off of biopsy. Advised pt to get pick up treatment and plan to be here tomorrow for her appt. FYI sent to Dr. Ardelia Mems and attendings for [facility] clinic. Please call and advised pt if you would like to postpone the endometrial biopsy.  [name], MD [facility] Resident  12/16/2015, 12:33 PM

## 2015-12-16 NOTE — Telephone Encounter (Signed)
Spoke with Dr. Nori Riis. Called patient. We will delay endometrial biopsy until after she's been adequately treated to reduce risk of causing ascending infection. Patient did get the flagyl and took it.  Appointment rescheduled for next week in [place] clinic with Dr. Gwendlyn Deutscher.  Leeanne Rio, MD

## 2015-12-17 ENCOUNTER — Ambulatory Visit: Payer: Self-pay

## 2015-12-24 ENCOUNTER — Ambulatory Visit (INDEPENDENT_AMBULATORY_CARE_PROVIDER_SITE_OTHER): Payer: Self-pay | Admitting: Family Medicine

## 2015-12-24 VITALS — BP 173/111 | HR 91 | Temp 98.3°F | Ht 65.0 in | Wt 136.6 lb

## 2015-12-24 DIAGNOSIS — N939 Abnormal uterine and vaginal bleeding, unspecified: Secondary | ICD-10-CM

## 2015-12-24 DIAGNOSIS — N923 Ovulation bleeding: Secondary | ICD-10-CM

## 2015-12-24 LAB — POCT URINE PREGNANCY: PREG TEST UR: NEGATIVE

## 2015-12-24 NOTE — Progress Notes (Signed)
Patient ID: Cheryl Carlson, female   DOB: 1970/11/04, 45 y.o.   MRN: [id number] Endometrial Biopsy Procedure Note (Failed Procedure)   Pre-operative Diagnosis: intermenstrual bleeding   Post-operative Diagnosis: same  Indications: intermenstrual bleeding  Procedure Details   Urine pregnancy test was done today and result was negative.  The risks (including infection, bleeding, pain, and uterine perforation) and benefits of the procedure were explained to the patient and Written informed consent was obtained.  Antibiotic prophylaxis against endocarditis was notwas not indicated.   The patient was placed in the dorsal lithotomy position.  Bimanual exam showed the uterus to be in the neutral position.  A Graves' speculum inserted in the vagina, and the cervix prepped with povidone iodine.  Endocervical curettage with a Kevorkian curette was not performed.  A sharp tenaculum was not applied to lip of the cervix for stabilization.  A sterile uterine sound was used to sound the uterus however the sound was not able to be advanced only about 4-5cm. This was thought to be due to cervical stenosis. Due to this, endometrial biopsy was not able to be obtained.   Condition: Stable  Complications: Failed procedure. Endometrial biopsy was not able to be obtained.   Plan: - referral to gynecology for endometrial biopsy due to failed procedure in clinic.  - additionally, blood pressure was noted to be elevated. Recommended follow up with PCP for HTN

## 2015-12-24 NOTE — Patient Instructions (Signed)
It was nice seeing you today. I am sorry about your irregular menses. I reviewed your ultrasound and felt this could be due to your fibroid rather than a more serious condition. Unfortunately we were unable to perform endometrial biopsy/biopsy of your uterine (womb) tissue due to the stenosis of your cervix. I will recommend that you follow up with your gynecologist as planned to get evaluated for your fibroid. Please contact us if you have any question or concern.

## 2015-12-24 NOTE — Addendum Note (Signed)
Addended by: Andrena Mews T on: 12/24/2015 01:37 PM   Modules accepted: Level of Service

## 2016-01-05 ENCOUNTER — Other Ambulatory Visit: Payer: Self-pay | Admitting: *Deleted

## 2016-01-05 ENCOUNTER — Ambulatory Visit (INDEPENDENT_AMBULATORY_CARE_PROVIDER_SITE_OTHER): Payer: Self-pay | Admitting: *Deleted

## 2016-01-05 DIAGNOSIS — K219 Gastro-esophageal reflux disease without esophagitis: Secondary | ICD-10-CM

## 2016-01-05 DIAGNOSIS — Z111 Encounter for screening for respiratory tuberculosis: Secondary | ICD-10-CM

## 2016-01-05 MED ORDER — OMEPRAZOLE 20 MG PO CPDR: 20.0000 mg | DELAYED_RELEASE_CAPSULE | Freq: Every day | ORAL | Status: DC

## 2016-01-05 NOTE — Progress Notes (Signed)
   PPD placed Left Forearm.  Pt to return 01/07/2016, Thursday for reading.  Pt tolerated intradermal injection. Derl Barrow, RN

## 2016-01-07 ENCOUNTER — Encounter: Payer: Self-pay | Admitting: Obstetrics and Gynecology

## 2016-01-07 ENCOUNTER — Ambulatory Visit (INDEPENDENT_AMBULATORY_CARE_PROVIDER_SITE_OTHER): Payer: Self-pay | Admitting: *Deleted

## 2016-01-07 ENCOUNTER — Encounter: Payer: Self-pay | Admitting: *Deleted

## 2016-01-07 ENCOUNTER — Ambulatory Visit (INDEPENDENT_AMBULATORY_CARE_PROVIDER_SITE_OTHER): Payer: Self-pay | Admitting: Obstetrics and Gynecology

## 2016-01-07 ENCOUNTER — Other Ambulatory Visit (HOSPITAL_COMMUNITY)
Admission: RE | Admit: 2016-01-07 | Discharge: 2016-01-07 | Disposition: A | Discharge status: Home / Self Care | Payer: Self-pay | Source: Ambulatory Visit | Attending: Obstetrics and Gynecology | Admitting: Obstetrics and Gynecology

## 2016-01-07 VITALS — BP 121/77 | HR 76 | Wt 135.1 lb

## 2016-01-07 DIAGNOSIS — Z111 Encounter for screening for respiratory tuberculosis: Secondary | ICD-10-CM

## 2016-01-07 DIAGNOSIS — N939 Abnormal uterine and vaginal bleeding, unspecified: Secondary | ICD-10-CM | POA: Insufficient documentation

## 2016-01-07 DIAGNOSIS — Z7689 Persons encountering health services in other specified circumstances: Secondary | ICD-10-CM

## 2016-01-07 LAB — TB SKIN TEST
INDURATION: 0 mm
TB SKIN TEST: NEGATIVE

## 2016-01-07 NOTE — Progress Notes (Signed)
   PPD Reading Note PPD read and results entered in EpicCare. Result: 0 mm induration. Interpretation: negative If test not read within 48-72 hours of initial placement, patient advised to repeat in other arm 1-3 weeks after this test. Allergic reaction: no  Martin, Tamika L, RN  

## 2016-01-07 NOTE — Progress Notes (Signed)
CLINIC ENCOUNTER NOTE  History:  45 y.o. F here referred for fibroids.  REferred from [facility]. Presented there for dub. Period every month, 3-5 days. Has had intermenstrual spotting for years. Hx myomectomy for dub in 2011. U/s showed fibroids. emb attempted but unable to perform. G2P0, hx 2 ectopics. No pain. Trichomonas positive earlier this month, treated. Partner didn't get treated. UPT neg 4/13.   Past Medical History  Diagnosis Date  . Mitral valve prolapse   . Hypertension     Past Surgical History  Procedure Laterality Date  . Appendectomy    . Uterine tumor removal  2011    The following portions of the patient's history were reviewed and updated as appropriate: allergies, current medications, past family history, past medical history, past social history, past surgical history and problem list.    Review of Systems:  See above; comprehensive review of systems was otherwise negative.  Objective:  Physical Exam BP 121/77 mmHg  Pulse 76  Wt 135 lb 1.6 oz (61.281 kg)  LMP 01/07/2016 CONSTITUTIONAL: Well-developed, well-nourished female in no acute distress.  HENT:  Normocephalic, atraumatic SKIN: Skin is warm and dry.  [place]: Alert  PSYCHIATRIC: Normal mood and affect.  CARDIOVASCULAR: Normal heart rate noted RESPIRATORY: Effort and breath sounds normal, no problems with respiration noted ABDOMEN: Soft, no distention noted.  No tenderness, rebound or guarding.  PELVIC: anteverted slightly large uterus GU: normal cervix, scant blood  Procedure - EMB Written informed consent Dorsal lithotomy, speculum Betadine x2 Tenaculum anterior lip Unable to pass emb pipelle; plastic dilators employed, pipelle then able to pass, passed x3, small/medium amount of tissue biopsied Tenaculum removed, no bleeding Pt tolerated procedure well, no complications   Labs and Imaging No results found.  Assessment & Plan:   # AUB - hx symptomatic fibroids, recent pelvic US shows  fibroids, may very well be the cause. H/H normal, so not causing significant bleeding. Not in pain. Recent trich positive, which could contribute, but says spotting has been going on for years. Recent tsh and a1c wnl. UPT negative. EMB performed today.  - f/u rpr (g/c and hiv recently negative.  - assuming emb negative, given patient mildly symptomatic, recommending Lng-IUD to attempt to suppress bleeding. Can f/u this clinic if non-surgical methods fail and patient still desiring surgery - repeat trichomonas naat toc in 1-2 weeks  Routine preventative health maintenance measures emphasized.     Eual Lindstrom B. Jaquita Bessire, [place] for [employer], [place]

## 2016-01-08 LAB — RPR

## 2016-01-11 ENCOUNTER — Telehealth: Payer: Self-pay | Admitting: Family Medicine

## 2016-01-11 NOTE — Telephone Encounter (Signed)
Patient stated she is having some cramping and bleeding from the cervical biopsy completed on 01/07/16.  The bleeding is not severe, but she thinks her cycle will start soon.  Advised patient to try Ibuprofen 400-600 mg to help with cramping.  If she starts to bleed really heavy, severe cramps, headache, n/v or dizziness to go to [facility] ED.  Patient stated understanding.  She also requested results of biopsy.  Will forward to PCP.  Derl Barrow, RN

## 2016-01-11 NOTE — Telephone Encounter (Signed)
No pathology results available yet.  [name], MD [facility] Family Medicine Resident  01/11/2016, 12:58 PM

## 2016-01-11 NOTE — Telephone Encounter (Signed)
Pt called and would like to speak to the nurse about who she is feeling after having a biopsy done a few days ago. She just wants to see if this is normal. jw

## 2016-01-13 NOTE — Telephone Encounter (Signed)
Called patient to inform her of normal biopsy results. Patient currently not interested in scheduling an appt for IUD insertion.  [name], MD [facility] Family Medicine Resident  01/13/2016, 10:51 AM

## 2016-02-19 ENCOUNTER — Other Ambulatory Visit: Payer: Self-pay | Admitting: Family Medicine

## 2016-02-19 NOTE — Telephone Encounter (Signed)
Patient in nurse clinic requesting refill on blood pressure medication.  Derl Barrow, RN

## 2016-03-07 ENCOUNTER — Telehealth: Payer: Self-pay | Admitting: Family Medicine

## 2016-03-07 MED ORDER — FLUCONAZOLE 150 MG PO TABS: 150.0000 mg | ORAL_TABLET | Freq: Once | ORAL | Status: DC

## 2016-03-07 NOTE — Telephone Encounter (Signed)
The patient is having a painful vulvar. Pain with urinating and washing her vaginal area.  She also endorses pain with intercourse.   She is not having any vaginal discharge. She feels very dry. She notes redness of her vulva when she looked with a mirror. LMP was beginning of June, scant and lighter than usual. She cannot tell me if her periods occur monthly.  She uses Summer's Eve to wash, some type of vaginal suppository for vaginal dryness (estronet). Only the suppository was new.   Will try a dose of Diflucan. If this does not work, patient advised to f/u in our clinic for evaluation. This may all be secondary to vaginal dryness as it sounds she could be perimenopausal.   [name], MD [facility] Family Medicine Resident  03/07/2016, 12:22 PM

## 2016-03-07 NOTE — Telephone Encounter (Signed)
Pt called and would like to speak to the nurse for Dr. Lorenso Courier. She said that this was private and didn't want to say exactly what the reason is. Blima Rich

## 2016-04-29 ENCOUNTER — Encounter (HOSPITAL_COMMUNITY): Payer: Self-pay | Admitting: *Deleted

## 2016-04-29 DIAGNOSIS — F17210 Nicotine dependence, cigarettes, uncomplicated: Secondary | ICD-10-CM | POA: Insufficient documentation

## 2016-04-29 DIAGNOSIS — N10 Acute tubulo-interstitial nephritis: Secondary | ICD-10-CM | POA: Insufficient documentation

## 2016-04-29 DIAGNOSIS — Z79899 Other long term (current) drug therapy: Secondary | ICD-10-CM | POA: Insufficient documentation

## 2016-04-29 DIAGNOSIS — Z7982 Long term (current) use of aspirin: Secondary | ICD-10-CM | POA: Insufficient documentation

## 2016-04-29 DIAGNOSIS — I10 Essential (primary) hypertension: Secondary | ICD-10-CM | POA: Insufficient documentation

## 2016-04-29 LAB — CBC WITH DIFFERENTIAL/PLATELET
BASOS PCT: 1 %
Basophils Absolute: 0.1 10*3/uL (ref 0.0–0.1)
EOS ABS: 0.1 10*3/uL (ref 0.0–0.7)
Eosinophils Relative: 1 %
HEMATOCRIT: 43.8 % (ref 36.0–46.0)
HEMOGLOBIN: 14.4 g/dL (ref 12.0–15.0)
LYMPHS ABS: 4.2 10*3/uL — AB (ref 0.7–4.0)
Lymphocytes Relative: 40 %
MCH: 29.7 pg (ref 26.0–34.0)
MCHC: 32.9 g/dL (ref 30.0–36.0)
MCV: 90.3 fL (ref 78.0–100.0)
MONO ABS: 0.8 10*3/uL (ref 0.1–1.0)
MONOS PCT: 8 %
NEUTROS PCT: 50 %
Neutro Abs: 5.3 10*3/uL (ref 1.7–7.7)
Platelets: 355 10*3/uL (ref 150–400)
RBC: 4.85 MIL/uL (ref 3.87–5.11)
RDW: 14.8 % (ref 11.5–15.5)
WBC: 10.5 10*3/uL (ref 4.0–10.5)

## 2016-04-29 LAB — COMPREHENSIVE METABOLIC PANEL
ALK PHOS: 54 U/L (ref 38–126)
ALT: 11 U/L — ABNORMAL LOW (ref 14–54)
ANION GAP: 8 (ref 5–15)
AST: 17 U/L (ref 15–41)
Albumin: 3.9 g/dL (ref 3.5–5.0)
BILIRUBIN TOTAL: 0.5 mg/dL (ref 0.3–1.2)
BUN: 8 mg/dL (ref 6–20)
CALCIUM: 9.5 mg/dL (ref 8.9–10.3)
CO2: 27 mmol/L (ref 22–32)
Chloride: 103 mmol/L (ref 101–111)
Creatinine, Ser: 0.88 mg/dL (ref 0.44–1.00)
GFR calc non Af Amer: >60 mL/min (ref >60)
Glucose, Bld: 85 mg/dL (ref 65–99)
POTASSIUM: 3.5 mmol/L (ref 3.5–5.1)
SODIUM: 138 mmol/L (ref 135–145)
TOTAL PROTEIN: 7 g/dL (ref 6.5–8.1)

## 2016-04-29 LAB — POC URINE PREG, ED: Preg Test, Ur: NEGATIVE

## 2016-04-29 LAB — URINALYSIS, ROUTINE W REFLEX MICROSCOPIC
Bilirubin Urine: NEGATIVE
GLUCOSE, UA: NEGATIVE mg/dL
KETONES UR: NEGATIVE mg/dL
Nitrite: POSITIVE — AB
PH: 7 (ref 5.0–8.0)
PROTEIN: 100 mg/dL — AB
Specific Gravity, Urine: 1.012 (ref 1.005–1.030)

## 2016-04-29 LAB — URINE MICROSCOPIC-ADD ON

## 2016-04-29 NOTE — ED Triage Notes (Signed)
Patient presents with frequent urination, lower back and lower abd pain

## 2016-04-29 NOTE — ED Notes (Signed)
Patient stated she did not take her BP meds today because she did not know how they would react with her being sick.  Also stated she usually goes back to the back immediately because she is a heart patient (mitral valve prolapse)

## 2016-04-30 ENCOUNTER — Emergency Department (HOSPITAL_COMMUNITY)
Admission: EM | Admit: 2016-04-30 | Discharge: 2016-04-30 | Disposition: A | Discharge status: Home / Self Care | Payer: Self-pay | Attending: Emergency Medicine | Admitting: Emergency Medicine

## 2016-04-30 DIAGNOSIS — N10 Acute tubulo-interstitial nephritis: Secondary | ICD-10-CM

## 2016-04-30 MED ORDER — DEXTROSE 5 % IV SOLN
1.0000 g | Freq: Once | INTRAVENOUS | Status: AC
  Administered 2016-04-30: 1 g via INTRAVENOUS
  Filled 2016-04-30: qty 10

## 2016-04-30 MED ORDER — SODIUM CHLORIDE 0.9 % IV BOLUS (SEPSIS)
1000.0000 mL | Freq: Once | INTRAVENOUS | Status: AC
  Administered 2016-04-30: 1000 mL via INTRAVENOUS

## 2016-04-30 MED ORDER — FENTANYL CITRATE (PF) 100 MCG/2ML IJ SOLN
100.0000 ug | Freq: Once | INTRAMUSCULAR | Status: AC
  Administered 2016-04-30: 100 ug via INTRAVENOUS
  Filled 2016-04-30: qty 2

## 2016-04-30 MED ORDER — CEPHALEXIN 500 MG PO CAPS: 500.0000 mg | ORAL_CAPSULE | Freq: Three times a day (TID) | ORAL | 0 refills | Status: DC

## 2016-04-30 MED ORDER — ONDANSETRON HCL 4 MG/2ML IJ SOLN
4.0000 mg | Freq: Once | INTRAMUSCULAR | Status: AC
  Administered 2016-04-30: 4 mg via INTRAVENOUS
  Filled 2016-04-30: qty 2

## 2016-04-30 NOTE — ED Provider Notes (Signed)
[place] DEPT Provider Note   CSN: [id number] Arrival date & time: 04/29/16  2157  By signing my name below, I, Cheryl Carlson, attest that this documentation has been prepared under the direction and in the presence of Cheryl Fraise, MD . Electronically Signed: Estanislado Carlson, Scribe. 04/30/2016. 12:49 AM.    History   Chief Complaint Chief Complaint  Patient presents with  . Hematemesis  . Abdominal Pain  . Back Pain    HPI Cheryl Carlson is a 45 y.o. female.  The history is provided by the patient. No language interpreter was used.  Abdominal Pain   This is a new problem. The current episode started yesterday. The problem occurs constantly. The problem has not changed since onset.The pain is located in the suprapubic region. Associated symptoms include vomiting. Pertinent negatives include melena.   HPI Comments:  Cheryl Carlson is a 45 y.o. female with a PSHx of appendectomy who presents to the Emergency Department complaining of constant, unchanged R sided flank pain that radiates to the lower abdomen x 1 day. Pt notes that her urine has been an orange color and then became yellow after drinking Gatorade and pedialite. Pt notes 1 episode of vomiting with mild specs of blood this evening.  OTC "urinary tract infection" medication attempted at home without any improvement. She reports ongoing intermittent chest pain that has been persistent for the last week. However, she attributes chest pain to possible gas. Pt denies any recent fever or chills. No melena or tarry stools. Denies any history of kidney stones. She is not currently on any anticoagulates. No prior history of known allergies to antibiotics.   Past Medical History:  Diagnosis Date  . Hypertension   . Mitral valve prolapse     Patient Active Problem List   Diagnosis Date Noted  . Abnormal uterine bleeding (AUB) 01/07/2016  . 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 Intermenstrual bleeding 12/03/2015  . Night  sweats 12/03/2015  . Essential hypertension, benign 09/22/2014  . GERD (gastroesophageal reflux disease) 09/22/2014  . Tobacco abuse 09/22/2014    Past Surgical History:  Procedure Laterality Date  . APPENDECTOMY    . uterine tumor removal  2011    OB History    No data available       Home Medications    Prior to Admission medications   Medication Sig Start Date End Date Taking? Authorizing Provider  amLODipine (NORVASC) 5 MG tablet TAKE 1 TABLET(5 MG) BY MOUTH DAILY 02/19/16   [name], MD  aspirin EC 81 MG tablet Take 81 mg by mouth daily.    Historical Provider, MD  fluconazole (DIFLUCAN) 150 MG tablet Take 1 tablet (150 mg total) by mouth once. 03/07/16   [name], MD  metroNIDAZOLE (FLAGYL) 500 MG tablet Take 4 tablets (2,000 mg total) by mouth once. Patient not taking: Reported on 01/07/2016 12/16/15   [name], MD  Multiple Vitamins-Minerals (HAIR/SKIN/NAILS) TABS Take 3 tablets by mouth daily.     Historical Provider, MD  nicotine (NICODERM CQ - DOSED IN MG/24 HOURS) 14 mg/24hr patch Place 1 patch (14 mg total) onto the skin daily. 09/22/14   [name], MD  omeprazole (PRILOSEC) 20 MG capsule Take 1 capsule (20 mg total) by mouth daily. 01/05/16   [name], MD    Family History No family history on file.  Social History Social History  Substance Use Topics  . Smoking status: Current Every Day Smoker    Packs/day: 0.50  Types: Cigarettes  . Smokeless tobacco: Never Used  . Alcohol use Yes     Comment: socially     Allergies   Review of patient's allergies indicates no known allergies.   Review of Systems Review of Systems  Gastrointestinal: Positive for abdominal pain and vomiting. Negative for blood in stool and melena.  Genitourinary: Positive for flank pain.  All other systems reviewed and are negative.    Physical Exam Updated Vital Signs BP (!) 166/129 (BP Location: Right Arm)   Pulse 89   Temp 98.5 F (36.9  C) (Oral)   Resp 20   Ht 5\' 5"  (1.651 m)   Wt 130 lb (59 kg)   SpO2 98%   BMI 21.63 kg/m   Physical Exam CONSTITUTIONAL: Well developed/well nourished HEAD: Normocephalic/atraumatic EYES: EOMI/PERRL ENMT: Mucous membranes moist NECK: supple no meningeal signs SPINE/BACK:entire spine nontender CV: S1/S2 noted, no murmurs/rubs/gallops noted LUNGS: Lungs are clear to auscultation bilaterally, no apparent distress ABDOMEN: soft, moderate suprapubic tenderness, mild diffuse tenderness, no rebound or guarding, bowel sounds noted throughout abdomen [id number] cva tenderness NEURO: Pt is awake/alert/appropriate, moves all extremitiesx4.  No facial droop.   EXTREMITIES: pulses normal/equal, full ROM SKIN: warm, color normal PSYCH: no abnormalities of mood noted, alert and oriented to situation  ED Treatments / Results  DIAGNOSTIC STUDIES:  Oxygen Saturation is 98% on RA, normal by my interpretation.    COORDINATION OF CARE:  12:49 AM- Will order urinalysis. Will give fluids, antibiotic, and pain medication. Discussed treatment plan with pt at bedside and pt agreed to plan.  Labs (all labs ordered are listed, but only abnormal results are displayed) Labs Reviewed  URINALYSIS, ROUTINE W REFLEX MICROSCOPIC (NOT AT [facility]) - Abnormal; Notable for the following:       Result Value   Color, Urine ORANGE (*)    APPearance CLOUDY (*)    Hgb urine dipstick LARGE (*)    Protein, ur 100 (*)    Nitrite POSITIVE (*)    Leukocytes, UA LARGE (*)    All other components within normal limits  CBC WITH DIFFERENTIAL/PLATELET - Abnormal; Notable for the following:    Lymphs Abs 4.2 (*)    All other components within normal limits  COMPREHENSIVE METABOLIC PANEL - Abnormal; Notable for the following:    ALT 11 (*)    All other components within normal limits  URINE MICROSCOPIC-ADD ON - Abnormal; Notable for the following:    Squamous Epithelial / LPF 0-5 (*)    Bacteria, UA RARE (*)    All other  components within normal limits  POC URINE PREG, ED    EKG  EKG Interpretation None       Radiology No results found.  Procedures Procedures (including critical care time)  Medications Ordered in ED Medications  cefTRIAXone (ROCEPHIN) 1 g in dextrose 5 % 50 mL IVPB (0 g Intravenous Stopped 04/30/16 0223)  sodium chloride 0.9 % bolus 1,000 mL (0 mLs Intravenous Stopped 04/30/16 0254)  fentaNYL (SUBLIMAZE) injection 100 mcg (100 mcg Intravenous Given 04/30/16 0128)  ondansetron (ZOFRAN) injection 4 mg (4 mg Intravenous Given 04/30/16 0128)     Initial Impression / Assessment and Plan / ED Course  I have reviewed the triage vital signs and the nursing notes.  Pertinent labs & imaging results that were available during my care of the patient were reviewed by me and considered in my medical decision making (see chart for details).  Clinical Course    Pt improved No vomiting  abd soft She is not septic appearing I doubt acute ureteral colic Appropriate for outpatient management She will f/u with PCP next week  Final Clinical Impressions(s) / ED Diagnoses   Final diagnoses:  Pyelonephritis, acute    New Prescriptions New Prescriptions   CEPHALEXIN (KEFLEX) 500 MG CAPSULE    Take 1 capsule (500 mg total) by mouth 3 (three) times daily.  I personally performed the services described in this documentation, which was scribed in my presence. The recorded information has been reviewed and is accurate.        Cheryl Fraise, MD 04/30/16 [phone]

## 2016-04-30 NOTE — ED Notes (Signed)
Pt tolerated fluids without feeling nauseated, pt ambulated to bathroom with steady gait, IV fluids still running. Pain controlled with the Fentanyl 100 mcg given.

## 2016-06-14 ENCOUNTER — Ambulatory Visit: Payer: Self-pay | Admitting: Family Medicine

## 2016-06-22 ENCOUNTER — Encounter: Payer: Self-pay | Admitting: Family Medicine

## 2016-06-22 ENCOUNTER — Ambulatory Visit (INDEPENDENT_AMBULATORY_CARE_PROVIDER_SITE_OTHER): Payer: Self-pay | Admitting: Family Medicine

## 2016-06-22 ENCOUNTER — Ambulatory Visit: Payer: Self-pay | Admitting: Family Medicine

## 2016-06-22 VITALS — BP 148/102 | HR 80 | Temp 98.0°F | Wt 124.0 lb

## 2016-06-22 DIAGNOSIS — N912 Amenorrhea, unspecified: Secondary | ICD-10-CM

## 2016-06-22 DIAGNOSIS — I10 Essential (primary) hypertension: Secondary | ICD-10-CM

## 2016-06-22 DIAGNOSIS — R110 Nausea: Secondary | ICD-10-CM

## 2016-06-22 LAB — POCT URINE PREGNANCY: PREG TEST UR: NEGATIVE

## 2016-06-22 MED ORDER — AMLODIPINE BESYLATE 5 MG PO TABS: 5.0000 mg | ORAL_TABLET | Freq: Every day | ORAL | 2 refills | Status: DC

## 2016-06-22 NOTE — Assessment & Plan Note (Signed)
Not controlled, most likely due to being out of medication for 1 week. Asymptomatic from elevated BPs.  - refilled amlodipine - advised to check BPs at home. - f/u in 3 months or sooner as needed.

## 2016-06-22 NOTE — Assessment & Plan Note (Signed)
I suspect the patient is perimenopausal at this time. Discussed negative urine pregnancy test. Will check FSH and serum hcg. Discussed that even if [facility] is normal and not significantly elevated, this does not mean that she's not going through menopause. If serum pregnancy test is positive, will needed to d/c amlodipine.

## 2016-06-22 NOTE — Progress Notes (Signed)
    Subjective: CC: pregnancy test HPI: Patient is a 45 y.o. female with a past medical history of abnormal uterine bleeding, HTN  presenting to clinic today for a pregnancy test.  Amenorrhea: LMP July 4th from what she tells me, 04/22/16 from what she tells the [place]. Really hoping to be pregnant.  She's had some light pink spotting intermittently over the last week. Also notes nausea intermittently that started 1 month ago. No vomiting. She has a good appetite. She notes a feeling of "butterflies" in her stomach. She has a friend that they thought was going through menopause and was actually pregnant, so the patient Is really hoping this is the case of her as well. She's intermittently had hot flashes, and these have been worsening since June 2017. No abnormal vaginal discharge, no dysuria.  Hypertension Meds: has been out of amlodipine for 1 week. Side effects: none  ROS: Denies headache, dizziness, visual changes, nausea, vomiting, chest pain, abdominal pain or shortness of breath.  Social History: smokes 0.5ppd  Health Maintenance: declined flu vaccine  ROS: All other systems reviewed and are negative .  Past Medical History Patient Active Problem List   Diagnosis Date Noted  . Amenorrhea 06/22/2016  . Abnormal uterine bleeding (AUB) 01/07/2016  . 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 Intermenstrual bleeding 12/03/2015  . Night sweats 12/03/2015  . Essential hypertension, benign 09/22/2014  . GERD (gastroesophageal reflux disease) 09/22/2014  . Tobacco abuse 09/22/2014    Medications- reviewed and updated  Objective: Office vital signs reviewed. BP (!) 148/102   Pulse 80   Temp 98 F (36.7 C) (Oral)   Wt 124 lb (56.2 kg)   LMP 04/22/2016   BMI 20.63 kg/m    Physical Examination:  General: Awake, alert, well- nourished, NAD Cardio: RRR, no m/r/g noted.  Pulm: No increased WOB.  CTAB, without wheezes, rhonchi or crackles noted.  GI: soft, NT/ND,+BS x4, no hepatomegaly, no  splenomegaly  Urine pregnancy negative  Assessment/Plan: Essential hypertension, benign Not controlled, most likely due to being out of medication for 1 week. Asymptomatic from elevated BPs.  - refilled amlodipine - advised to check BPs at home. - f/u in 3 months or sooner as needed.  Amenorrhea I suspect the patient is perimenopausal at this time. Discussed negative urine pregnancy test. Will check FSH and serum hcg. Discussed that even if [facility] is normal and not significantly elevated, this does not mean that she's not going through menopause. If serum pregnancy test is positive, will needed to d/c amlodipine.    Orders Placed This Encounter  Procedures  . Follicle stimulating hormone  . hCG, serum, qualitative  . POCT urine pregnancy    Meds ordered this encounter  Medications  . amLODipine (NORVASC) 5 MG tablet    Sig: Take 1 tablet (5 mg total) by mouth daily.    Dispense:  30 tablet    Refill:  [place] PGY-2, [place]

## 2016-06-22 NOTE — Patient Instructions (Signed)
Your urine pregnancy test was negative. We will check a serum pregnancy test and a test [facility]) that is typically elevated in the setting of menopause. If your pregnancy test is positive, we will need to change your blood pressure medication. If your nausea worsens or fails to improve, please follow up with [place]. Please follow up with me in 3 months so we can keep any eye on your blood pressure

## 2016-06-23 LAB — FOLLICLE STIMULATING HORMONE: FSH: 110.4 m[IU]/mL

## 2016-06-23 LAB — HCG, SERUM, QUALITATIVE: PREG SERUM: NEGATIVE

## 2016-06-24 ENCOUNTER — Telehealth: Payer: Self-pay | Admitting: Family Medicine

## 2016-06-24 NOTE — Telephone Encounter (Signed)
Called to let patient know that serum pregnancy test was negative as we expected.  [place] is elevated indicative of going through menopause.   Patient voiced understanding and appreciation for the call.  [name], MD [facility] Family Medicine Resident  06/24/2016, 1:51 PM

## 2016-06-29 ENCOUNTER — Other Ambulatory Visit: Payer: Self-pay

## 2016-06-29 ENCOUNTER — Emergency Department (HOSPITAL_COMMUNITY): Payer: Self-pay

## 2016-06-29 ENCOUNTER — Emergency Department (HOSPITAL_COMMUNITY)
Admission: EM | Admit: 2016-06-29 | Discharge: 2016-06-29 | Disposition: A | Discharge status: Home / Self Care | Payer: Self-pay | Attending: Emergency Medicine | Admitting: Emergency Medicine

## 2016-06-29 DIAGNOSIS — Z7982 Long term (current) use of aspirin: Secondary | ICD-10-CM | POA: Insufficient documentation

## 2016-06-29 DIAGNOSIS — I10 Essential (primary) hypertension: Secondary | ICD-10-CM | POA: Insufficient documentation

## 2016-06-29 DIAGNOSIS — F17210 Nicotine dependence, cigarettes, uncomplicated: Secondary | ICD-10-CM | POA: Insufficient documentation

## 2016-06-29 DIAGNOSIS — Z79899 Other long term (current) drug therapy: Secondary | ICD-10-CM | POA: Insufficient documentation

## 2016-06-29 DIAGNOSIS — R0789 Other chest pain: Secondary | ICD-10-CM | POA: Insufficient documentation

## 2016-06-29 DIAGNOSIS — R079 Chest pain, unspecified: Secondary | ICD-10-CM

## 2016-06-29 LAB — CBC
HEMATOCRIT: 44 % (ref 36.0–46.0)
Hemoglobin: 14.8 g/dL (ref 12.0–15.0)
MCH: 29.4 pg (ref 26.0–34.0)
MCHC: 33.6 g/dL (ref 30.0–36.0)
MCV: 87.5 fL (ref 78.0–100.0)
PLATELETS: 360 10*3/uL (ref 150–400)
RBC: 5.03 MIL/uL (ref 3.87–5.11)
RDW: 15.1 % (ref 11.5–15.5)
WBC: 9.2 10*3/uL (ref 4.0–10.5)

## 2016-06-29 LAB — I-STAT TROPONIN, ED
Troponin i, poc: 0.01 ng/mL (ref 0.00–0.08)
Troponin i, poc: 0 ng/mL (ref 0.00–0.08)

## 2016-06-29 LAB — BASIC METABOLIC PANEL
Anion gap: 9 (ref 5–15)
BUN: 6 mg/dL (ref 6–20)
CHLORIDE: 103 mmol/L (ref 101–111)
CO2: 26 mmol/L (ref 22–32)
CREATININE: 0.82 mg/dL (ref 0.44–1.00)
Calcium: 9.6 mg/dL (ref 8.9–10.3)
Glucose, Bld: 98 mg/dL (ref 65–99)
POTASSIUM: 3.9 mmol/L (ref 3.5–5.1)
SODIUM: 138 mmol/L (ref 135–145)

## 2016-06-29 MED ORDER — AMLODIPINE BESYLATE 5 MG PO TABS: 10.0000 mg | ORAL_TABLET | Freq: Every day | ORAL | 0 refills | Status: DC

## 2016-06-29 MED ORDER — AMLODIPINE BESYLATE 5 MG PO TABS
5.0000 mg | ORAL_TABLET | Freq: Once | ORAL | Status: AC
  Administered 2016-06-29: 5 mg via ORAL
  Filled 2016-06-29: qty 1

## 2016-06-29 NOTE — Discharge Instructions (Signed)
Please take two tablets (10mg ) of amlodipine daily.  Follow up with your primary care provider so they can adjust your blood pressure medications as needed.

## 2016-06-29 NOTE — ED Provider Notes (Signed)
[place] DEPT Provider Note   CSN: [id number] Arrival date & time: 06/29/16  1140     History   Chief Complaint Chief Complaint  Patient presents with  . Chest Pain    HPI Cheryl Carlson is a 45 year old woman with history of GERD, HTN and mitral valve prolapse.  HPI   She presents with chest pain. It started two weeks ago. Located in left chest. Radiates to left arm and left leg. It comes every other day. Lasts for hours. Worsens with stress. Improves with propping pillow when lying on left side. No other exacerbating or relieving factors. Associated with dyspnea but no diaphoresis or nausea. Also has associated left arm paresthesias. She has tried taking aspirin with no improvement. She has been having a lot of stress recently at home. Does not get chest pain with walking. She has dyspnea with walking half a block. She has 1 pillow orthopnea. She has PND. She has had similar chest pain before in 2007. She has had episodes of hypertensive "attacks."  Mother had MI in her 48s. Father had an MI in his 20s.  Past Medical History:  Diagnosis Date  . Hypertension   . Mitral valve prolapse     Patient Active Problem List   Diagnosis Date Noted  . Amenorrhea 06/22/2016  . Abnormal uterine bleeding (AUB) 01/07/2016  . 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 Intermenstrual bleeding 12/03/2015  . Night sweats 12/03/2015  . Essential hypertension, benign 09/22/2014  . GERD (gastroesophageal reflux disease) 09/22/2014  . Tobacco abuse 09/22/2014    Past Surgical History:  Procedure Laterality Date  . APPENDECTOMY    . uterine tumor removal  2011       Home Medications    Prior to Admission medications   Medication Sig Start Date End Date Taking? Authorizing Provider  amLODipine (NORVASC) 5 MG tablet Take 1 tablet (5 mg total) by mouth daily. 06/22/16   [name], MD  aspirin EC 81 MG tablet Take 81 mg by mouth daily.    Historical Provider, MD  Multiple  Vitamins-Minerals (HAIR/SKIN/NAILS) TABS Take 3 tablets by mouth daily.     Historical Provider, MD    Family History No family history on file.  Social History Social History  Substance Use Topics  . Smoking status: Current Every Day Smoker    Packs/day: 0.50    Types: Cigarettes  . Smokeless tobacco: Never Used  . Alcohol use Yes     Comment: socially     Allergies   Review of patient's allergies indicates no known allergies.   Review of Systems Review of Systems Constitutional: no fevers/chills Eyes: +vision changes Ears, nose, mouth, throat, and face: +cough with occasional blood Respiratory: no shortness of breath Cardiovascular: +chest pain Gastrointestinal: no nausea/vomiting, no abdominal pain, no constipation, no diarrhea Genitourinary: no dysuria, no hematuria Integument: no rash Hematologic/lymphatic: no bleeding/bruising, no edema Musculoskeletal: no arthralgias, no myalgias Neurological: no paresthesias, no weakness   Physical Exam Updated Vital Signs BP (!) 184/124 (BP Location: Right Arm)   Pulse 80   Temp 98.7 F (37.1 C) (Oral)   Resp 18   Wt 56.7 kg   LMP 04/22/2016   SpO2 100%   BMI 20.80 kg/m   Physical Exam General Apperance: NAD Head: Normocephalic, atraumatic Eyes: PERRL, EOMI, anicteric sclera Ears: Normal external ear canal Nose: Nares normal, septum midline, mucosa normal Throat: Lips, mucosa and tongue normal  Neck: Supple, trachea midline Back: No tenderness or bony abnormality  Lungs: Clear to  auscultation bilaterally. No wheezes, rhonchi or rales. Breathing comfortably Chest Wall: Nontender, no deformity Heart: Regular rate and rhythm, mitral valve click appreciated best in left lower sternal border Abdomen: Soft, nontender, nondistended, no rebound/guarding Extremities: Normal, atraumatic, warm and well perfused, no edema Pulses: 2+ throughout Skin: No rashes or lesions Neurologic: Alert and oriented x 3. CNII-XII  intact. Normal strength and sensation  ED Treatments / Results  Labs (all labs ordered are listed, but only abnormal results are displayed) Labs Reviewed  BASIC METABOLIC PANEL  CBC  I-STAT [place], ED    EKG  EKG Interpretation  Date/Time:  Wednesday June 29 2016 11:49:31 EDT Ventricular Rate:  84 PR Interval:  140 QRS Duration: 68 QT Interval:  378 QTC Calculation: 446 R Axis:   95 Text Interpretation:  Normal sinus rhythm Rightward axis Septal infarct , age undetermined No significant change since last tracing Confirmed by Maryan Rued  MD, [place] ([id number]) on 06/29/2016 12:16:04 PM       Radiology Dg Chest 2 View  Result Date: 06/29/2016 CLINICAL DATA:  Chest pain.  Shortness of breath. EXAM: CHEST  2 VIEW COMPARISON:  05/22/2014. FINDINGS: Lungs are clear. Heart size normal. No pleural effusion or pneumothorax. No acute bony abnormality. IMPRESSION: No acute cardiopulmonary disease. Electronically Signed   By: Marcello Moores  Register   On: 06/29/2016 12:18   Procedures   Medications Ordered in ED Medications - No data to display   Initial Impression / Assessment and Plan / ED Course  I have reviewed the triage vital signs and the nursing notes.  Pertinent labs & imaging results that were available during my care of the patient were reviewed by me and considered in my medical decision making (see chart for details).  Clinical Course  13:00 evaluated pt. Give additional amlodipine 5mg  and check BP in both arms. 13:32 BP different of 10 mmhg systolic between arms 0000000 BP now down to the 123456 systolic. Asymptomatic.  Final Clinical Impressions(s) / ED Diagnoses   Final diagnoses:  None  Intermittent chest pain for the past two weeks. Worsens with stress/agitation. BMP and CBC unremarkable. EKG without acute ischemic changes. CXR with no acute cardiopulmonary disease. Not reproducible on palpation making MSK unlikely. Low risk by Wells score for PE. Not presently having  chest pain.   Hypertension with BP to 184/124 here. She has taken her amlodipine 5mg  this morning. She was given an additional tablet of 5mg  today. Will ask her to take two tablets (10mg ) of amlodipine daily.  Will ask her to follow up with her PCP. She may need to have an echo to reevaluate her mitral valve prolapse. I do not see one in Epic. Further adjustments in her BP meds   New Prescriptions New Prescriptions   No medications on file     Milagros Loll, MD 06/29/16 [place], MD 06/30/16 2204

## 2016-06-29 NOTE — ED Triage Notes (Signed)
Cp x 2 weeks n/v/sob, has HTN has been under stress has a bad h/a also

## 2016-06-29 NOTE — ED Notes (Signed)
Resident at the bedside

## 2016-07-01 ENCOUNTER — Inpatient Hospital Stay: Payer: Self-pay | Admitting: Family Medicine

## 2016-07-01 NOTE — Progress Notes (Deleted)
   Subjective:   Patient ID: Cheryl Carlson    DOB: [birth year], 45 y.o. female   MRN: [id number]  CC: High Blood Pressure  HPI: Cheryl Carlson is a 45 y.o. female who presents to clinic today for high blood pressure. Problems discussed today are as follows:  1. High Blood Pressure: patient was seen at ED 06/29/16 for ACS r/o and found to be hypertensive at 184/124. Patient takes amlodipine 5 mg QD at home. Was given additional 5 mg in ED. CXR was neg and EKG w/o ischemic changes. Systolic was decreased to 160s while in ED. Patient told to take 10 mg amlodipine QD upon d/c. Patient does have h/o MVP but has no ECHO on file.  2. ***: ***  ROS: See HPI for pertinent ROS.  [place]: Pertinent past medical, surgical, family, and social history were reviewed and updated as appropriate. Smoking status reviewed.  Medications reviewed. Current Outpatient Prescriptions  Medication Sig Dispense Refill  . amLODipine (NORVASC) 5 MG tablet Take 2 tablets (10 mg total) by mouth daily. 30 tablet 0  . aspirin EC 81 MG tablet Take 81 mg by mouth daily.    . Multiple Vitamins-Minerals (HAIR/SKIN/NAILS) TABS Take 3 tablets by mouth daily.      No current facility-administered medications for this visit.     Objective:   LMP 04/22/2016  Vitals and nursing note reviewed.  General: well nourished, well developed, in no acute distress with non-toxic appearance HEENT: normocephalic, atraumatic, moist mucous membranes Neck: supple, non-tender without lymphadenopathy CV: regular rate and rhythm without murmurs, rubs, or gallops Lungs: clear to auscultation bilaterally with normal work of breathing Abdomen: soft, non-tender, non-distended, no masses or organomegaly palpable, normoactive bowel sounds Skin: warm, dry, no rashes or lesions, cap refill < 2 seconds Extremities: warm and well perfused, normal tone  Assessment & Plan:   No problem-specific Assessment & Plan notes found for this  encounter.  No orders of the defined types were placed in this encounter.  No orders of the defined types were placed in this encounter.   Harriet Butte, [place], PGY-1 07/01/2016 7:18 AM

## 2016-09-27 ENCOUNTER — Other Ambulatory Visit: Payer: Self-pay

## 2016-09-27 ENCOUNTER — Emergency Department (HOSPITAL_COMMUNITY)
Admission: EM | Admit: 2016-09-27 | Discharge: 2016-09-27 | Disposition: A | Discharge status: Home / Self Care | Payer: Self-pay | Attending: Emergency Medicine | Admitting: Emergency Medicine

## 2016-09-27 ENCOUNTER — Encounter (HOSPITAL_COMMUNITY): Payer: Self-pay

## 2016-09-27 ENCOUNTER — Emergency Department (HOSPITAL_COMMUNITY): Payer: Self-pay

## 2016-09-27 DIAGNOSIS — N3000 Acute cystitis without hematuria: Secondary | ICD-10-CM | POA: Insufficient documentation

## 2016-09-27 DIAGNOSIS — Z7982 Long term (current) use of aspirin: Secondary | ICD-10-CM | POA: Insufficient documentation

## 2016-09-27 DIAGNOSIS — R0789 Other chest pain: Secondary | ICD-10-CM | POA: Insufficient documentation

## 2016-09-27 DIAGNOSIS — I10 Essential (primary) hypertension: Secondary | ICD-10-CM | POA: Insufficient documentation

## 2016-09-27 DIAGNOSIS — F17210 Nicotine dependence, cigarettes, uncomplicated: Secondary | ICD-10-CM | POA: Insufficient documentation

## 2016-09-27 DIAGNOSIS — Z79899 Other long term (current) drug therapy: Secondary | ICD-10-CM | POA: Insufficient documentation

## 2016-09-27 DIAGNOSIS — R6889 Other general symptoms and signs: Secondary | ICD-10-CM

## 2016-09-27 LAB — URINALYSIS, ROUTINE W REFLEX MICROSCOPIC
Bilirubin Urine: NEGATIVE
Glucose, UA: NEGATIVE mg/dL
Ketones, ur: NEGATIVE mg/dL
Nitrite: NEGATIVE
Protein, ur: NEGATIVE mg/dL
SPECIFIC GRAVITY, URINE: 1.006 (ref 1.005–1.030)
pH: 5 (ref 5.0–8.0)

## 2016-09-27 LAB — BASIC METABOLIC PANEL
ANION GAP: 15 (ref 5–15)
BUN: 8 mg/dL (ref 6–20)
CALCIUM: 9.6 mg/dL (ref 8.9–10.3)
CO2: 23 mmol/L (ref 22–32)
Chloride: 94 mmol/L — ABNORMAL LOW (ref 101–111)
Creatinine, Ser: 0.82 mg/dL (ref 0.44–1.00)
GFR calc Af Amer: >60 mL/min (ref >60)
GLUCOSE: 128 mg/dL — AB (ref 65–99)
Potassium: 3.4 mmol/L — ABNORMAL LOW (ref 3.5–5.1)
Sodium: 132 mmol/L — ABNORMAL LOW (ref 135–145)

## 2016-09-27 LAB — CBC
HCT: 42.7 % (ref 36.0–46.0)
Hemoglobin: 14.4 g/dL (ref 12.0–15.0)
MCH: 30 pg (ref 26.0–34.0)
MCHC: 33.7 g/dL (ref 30.0–36.0)
MCV: 89 fL (ref 78.0–100.0)
Platelets: 318 10*3/uL (ref 150–400)
RBC: 4.8 MIL/uL (ref 3.87–5.11)
RDW: 14.2 % (ref 11.5–15.5)
WBC: 19.2 10*3/uL — ABNORMAL HIGH (ref 4.0–10.5)

## 2016-09-27 LAB — I-STAT TROPONIN, ED
TROPONIN I, POC: 0 ng/mL (ref 0.00–0.08)
TROPONIN I, POC: 0.01 ng/mL (ref 0.00–0.08)

## 2016-09-27 LAB — I-STAT CG4 LACTIC ACID, ED
LACTIC ACID, VENOUS: 0.95 mmol/L (ref 0.5–1.9)
Lactic Acid, Venous: 1.11 mmol/L (ref 0.5–1.9)

## 2016-09-27 MED ORDER — CEPHALEXIN 500 MG PO CAPS: 500.0000 mg | ORAL_CAPSULE | Freq: Three times a day (TID) | ORAL | 0 refills | Status: AC

## 2016-09-27 MED ORDER — ACETAMINOPHEN 325 MG PO TABS
650.0000 mg | ORAL_TABLET | Freq: Once | ORAL | Status: AC
  Administered 2016-09-27: 650 mg via ORAL
  Filled 2016-09-27: qty 2

## 2016-09-27 MED ORDER — IBUPROFEN 400 MG PO TABS: 600.0000 mg | ORAL_TABLET | Freq: Once | ORAL | Status: DC

## 2016-09-27 NOTE — ED Triage Notes (Signed)
Per Pt, Pt had vomiting and nausea about a week ago. Pt reports the last two days having chills, fever, chest pain on the left side, HTN, and constipation. Pt reports having some abdominal pain with the constipation, but pt was able to have a bowel movement today. Pain continued.

## 2016-09-27 NOTE — ED Provider Notes (Signed)
[name] Provider Note   CSN: [id number] Arrival date & time: 09/27/16  1618     History   Chief Complaint Chief Complaint  Patient presents with  . Hypertension  . Chest Pain    HPI Cheryl Carlson is a 46 y.o. female presenting with fever, chills, nausea, vomiting, urinary frequency without dysuria or hematuria, nonbloody diarrhea today, myalgias, and headache. She states that it started about a week ago. She has tried ibuprofen with some relief, Alka-Seltzer and vitamin C. Denies ill contacts. She has also been experiencing some left-sided chest discomfort on the left side of her breast. Pain is made worse with lifting her left arm that is tender to palpation which has been going on for 4 days. The pain is nonradiating and she denies any shortness of breath or syncope.She denies cough or congestion. She thought that she was going through menopausal changes with chills until she took her temperature and realized she had a temp of 102. LMP was in July. HPI  Past Medical History:  Diagnosis Date  . Hypertension   . Mitral valve prolapse     Patient Active Problem List   Diagnosis Date Noted  . Amenorrhea 06/22/2016  . Abnormal uterine bleeding (AUB) 01/07/2016  . 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 Intermenstrual bleeding 12/03/2015  . Night sweats 12/03/2015  . Essential hypertension, benign 09/22/2014  . GERD (gastroesophageal reflux disease) 09/22/2014  . Tobacco abuse 09/22/2014    Past Surgical History:  Procedure Laterality Date  . APPENDECTOMY    . uterine tumor removal  2011    OB History    No data available       Home Medications    Prior to Admission medications   Medication Sig Start Date End Date Taking? Authorizing Provider  amLODipine (NORVASC) 10 MG tablet Take 10 mg by mouth daily.   Yes Historical Provider, MD  aspirin EC 81 MG tablet Take 81 mg by mouth daily.   Yes Historical Provider, MD  Multiple Vitamins-Minerals (HAIR/SKIN/NAILS) TABS  Take 3 tablets by mouth daily.    Yes Historical Provider, MD  Phenyleph-CPM-DM-APAP (ALKA-SELTZER PLUS COLD & COUGH) 01-11-09-325 MG CAPS Take 1 capsule by mouth daily as needed (cold).   Yes Historical Provider, MD  cephALEXin (KEFLEX) 500 MG capsule Take 1 capsule (500 mg total) by mouth 3 (three) times daily. 09/27/16 10/07/16  Emeline General, PA-C    Family History No family history on file.  Social History Social History  Substance Use Topics  . Smoking status: Current Every Day Smoker    Packs/day: 0.50    Types: Cigarettes  . Smokeless tobacco: Never Used  . Alcohol use Yes     Comment: socially     Allergies   Patient has no known allergies.   Review of Systems Review of Systems  Constitutional: Negative for chills and fever.  HENT: Negative for ear pain and sore throat.   Eyes: Negative for pain and visual disturbance.  Respiratory: Negative for cough and shortness of breath.   Cardiovascular: Negative for chest pain and palpitations.  Gastrointestinal: Positive for diarrhea, nausea and vomiting. Negative for abdominal distention, abdominal pain and blood in stool.       She has experienced nausea and vomiting but not at this time.  Endocrine: Positive for polydipsia.  Genitourinary: Positive for frequency. Negative for dysuria, flank pain, hematuria, pelvic pain, vaginal bleeding and vaginal discharge.  Musculoskeletal: Positive for myalgias. Negative for arthralgias, back pain, neck pain and neck stiffness.  Skin: Negative for color change, pallor and rash.  Neurological: Positive for headaches. Negative for seizures and syncope.  All other systems reviewed and are negative.    Physical Exam Updated Vital Signs BP 139/87   Pulse 91   Temp 98.2 F (36.8 C) (Oral)   Resp 15   Ht 5\' 5"  (1.651 m)   Wt 54.4 kg   SpO2 96%   BMI 19.97 kg/m   Physical Exam  Constitutional: She appears well-developed and well-nourished. No distress.  Afebrile with last  antipyretics greater than 6 hours ago. Nontoxic appearing, sitting comfortably in bed in no acute distress.  HENT:  Head: Normocephalic and atraumatic.  Eyes: Conjunctivae and EOM are normal. Right eye exhibits no discharge. Left eye exhibits no discharge.  Neck: Normal range of motion. Neck supple. No JVD present.  Cardiovascular: Normal rate, regular rhythm and normal heart sounds.   No murmur heard. Pulmonary/Chest: Effort normal and breath sounds normal. No stridor. No respiratory distress. She has no wheezes. She exhibits tenderness.  Patient has focal tenderness at the fifth intercostal mid axillary line on the left.  Abdominal: Soft. Bowel sounds are normal. She exhibits no distension. There is no tenderness. There is no rebound and no guarding.  Musculoskeletal: Normal range of motion. She exhibits no edema, tenderness or deformity.  Full range of motion of the left shoulder with no bony tenderness.  Neurological: She is alert.  Skin: Skin is warm. No rash noted. She is not diaphoretic. No erythema. No pallor.  Psychiatric: She has a normal mood and affect.  Nursing note and vitals reviewed.    ED Treatments / Results  Labs (all labs ordered are listed, but only abnormal results are displayed) Labs Reviewed  BASIC METABOLIC PANEL - Abnormal; Notable for the following:       Result Value   Sodium 132 (*)    Potassium 3.4 (*)    Chloride 94 (*)    Glucose, Bld 128 (*)    All other components within normal limits  CBC - Abnormal; Notable for the following:    WBC 19.2 (*)    All other components within normal limits  URINALYSIS, ROUTINE W REFLEX MICROSCOPIC - Abnormal; Notable for the following:    APPearance HAZY (*)    Hgb urine dipstick MODERATE (*)    Leukocytes, UA LARGE (*)    Bacteria, UA FEW (*)    Squamous Epithelial / LPF 0-5 (*)    All other components within normal limits  I-STAT TROPOININ, ED  I-STAT CG4 LACTIC ACID, ED  I-STAT CG4 LACTIC ACID, ED     EKG  EKG Interpretation  Date/Time:  Tuesday September 27 2016 16:23:52 EST Ventricular Rate:  111 PR Interval:  130 QRS Duration: 72 QT Interval:  336 QTC Calculation: 456 R Axis:   102 Text Interpretation:  Sinus tachycardia Right atrial enlargement Rightward axis No significant change since last tracing Confirmed by KNOTT MD, DANIEL [id number]) on 09/27/2016 5:48:42 PM       Radiology Dg Chest 2 View  Result Date: 09/27/2016 CLINICAL DATA:  Fever, headache, left-sided chest pain EXAM: CHEST  2 VIEW COMPARISON:  Chest x-ray of 06/29/2016 FINDINGS: No active infiltrate or effusion is seen. The lungs are slightly hyperaerated. Mediastinal and hilar contours are unremarkable. The heart is within normal limits in size. No bony abnormality is seen. IMPRESSION: No active cardiopulmonary disease. Electronically Signed   By: Ivar Drape M.D.   On: 09/27/2016 16:46    Procedures  Procedures (including critical care time)  Medications Ordered in ED Medications  acetaminophen (TYLENOL) tablet 650 mg (650 mg Oral Given 09/27/16 1943)     Initial Impression / Assessment and Plan / ED Course  I have reviewed the triage vital signs and the nursing notes.  Pertinent labs & imaging results that were available during my care of the patient were reviewed by me and considered in my medical decision making (see chart for details).  Clinical Course    46 year old presenting with 1 week of fever, chills, nausea, vomiting, myalgias and mid axillary chest wall tenderness. She reported a sweet odor to her urine and urinary frequency. Chest xray negative, EKG without acute changes. Negative troponin U/A showed evidence of UTI  On exam she was tender to palpation of 5th intercostal at the midaxillary line. No rash noted. Exam otherwise unremarkable.  Discharge home with keflex and symptomatic relief with follow up with primary care.  Discussed strict return precautions. Patient was advised to return  to the emergency department if experiencing any worsening of symptoms. She understood instructions and agreed with discharge plan. Patient was discussed with Dr. Laneta Simmers who agrees with assessment and plan.  Final Clinical Impressions(s) / ED Diagnoses   Final diagnoses:  Chest wall pain  Acute cystitis without hematuria  Flu-like symptoms    New Prescriptions New Prescriptions   CEPHALEXIN (KEFLEX) 500 MG CAPSULE    Take 1 capsule (500 mg total) by mouth 3 (three) times daily.     Emeline General, PA-C 09/27/16 2148    Leo Grosser, MD 09/28/16 [phone]

## 2016-10-27 ENCOUNTER — Encounter (HOSPITAL_COMMUNITY): Payer: Self-pay | Admitting: Emergency Medicine

## 2016-10-27 ENCOUNTER — Emergency Department (HOSPITAL_COMMUNITY)
Admission: EM | Admit: 2016-10-27 | Discharge: 2016-10-27 | Disposition: A | Discharge status: Home / Self Care | Payer: Self-pay | Attending: Emergency Medicine | Admitting: Emergency Medicine

## 2016-10-27 DIAGNOSIS — M436 Torticollis: Secondary | ICD-10-CM | POA: Insufficient documentation

## 2016-10-27 DIAGNOSIS — Z7982 Long term (current) use of aspirin: Secondary | ICD-10-CM | POA: Insufficient documentation

## 2016-10-27 DIAGNOSIS — F17210 Nicotine dependence, cigarettes, uncomplicated: Secondary | ICD-10-CM | POA: Insufficient documentation

## 2016-10-27 DIAGNOSIS — Z79899 Other long term (current) drug therapy: Secondary | ICD-10-CM | POA: Insufficient documentation

## 2016-10-27 DIAGNOSIS — I10 Essential (primary) hypertension: Secondary | ICD-10-CM | POA: Insufficient documentation

## 2016-10-27 DIAGNOSIS — J039 Acute tonsillitis, unspecified: Secondary | ICD-10-CM | POA: Insufficient documentation

## 2016-10-27 LAB — RAPID STREP SCREEN (MED CTR MEBANE ONLY): STREPTOCOCCUS, GROUP A SCREEN (DIRECT): NEGATIVE

## 2016-10-27 MED ORDER — HYDROCODONE-ACETAMINOPHEN 5-325 MG PO TABS: 1.0000 | ORAL_TABLET | Freq: Once | ORAL | Status: DC

## 2016-10-27 MED ORDER — PENICILLIN V POTASSIUM 500 MG PO TABS: 500.0000 mg | ORAL_TABLET | Freq: Four times a day (QID) | ORAL | 0 refills | Status: AC

## 2016-10-27 MED ORDER — DIAZEPAM 5 MG PO TABS
5.0000 mg | ORAL_TABLET | Freq: Once | ORAL | Status: AC
  Administered 2016-10-27: 5 mg via ORAL
  Filled 2016-10-27: qty 1

## 2016-10-27 MED ORDER — NAPROXEN 500 MG PO TABS: 500.0000 mg | ORAL_TABLET | Freq: Two times a day (BID) | ORAL | 0 refills | Status: DC

## 2016-10-27 MED ORDER — MAGIC MOUTHWASH
5.0000 mL | Freq: Once | ORAL | Status: AC
  Administered 2016-10-27: 5 mL via ORAL
  Filled 2016-10-27: qty 5

## 2016-10-27 MED ORDER — KETOROLAC TROMETHAMINE 60 MG/2ML IM SOLN
30.0000 mg | Freq: Once | INTRAMUSCULAR | Status: AC
  Administered 2016-10-27: 30 mg via INTRAMUSCULAR
  Filled 2016-10-27: qty 2

## 2016-10-27 MED ORDER — DIAZEPAM 2 MG PO TABS: 2.0000 mg | ORAL_TABLET | Freq: Three times a day (TID) | ORAL | 0 refills | Status: DC | PRN

## 2016-10-27 NOTE — ED Provider Notes (Signed)
[facility] DEPT Provider Note   CSN: [id number] Arrival date & time: 10/27/16  1124     History   Chief Complaint Chief Complaint  Patient presents with  . Neck Pain  . Dysphagia    HPI Cheryl Carlson is a 46 y.o. female.  HPI Cheryl Carlson is a 46 y.o. female in emergency department with complaints of neck pain and sore throat. Patient states she developed left-sided neck pain, over trapezius muscle today's ago. She initially thought she had a muscle strain and was treating it with a muscle relaxant at home. States yesterday he developed left-sided sore throat. She states it is painful to swallow. Denies difficulty swallowing. Denies any fever or chills. She reports neck stiffness, mild headache. She denies any photophobia. No cough, congestion, no nausea, vomiting, diarrhea. She states she rolled her symptoms and was concerned she may have meningitis which brought her to emergency department.  Past Medical History:  Diagnosis Date  . Hypertension   . Mitral valve prolapse     Patient Active Problem List   Diagnosis Date Noted  . Amenorrhea 06/22/2016  . Abnormal uterine bleeding (AUB) 01/07/2016  . 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 Intermenstrual bleeding 12/03/2015  . Night sweats 12/03/2015  . Essential hypertension, benign 09/22/2014  . GERD (gastroesophageal reflux disease) 09/22/2014  . Tobacco abuse 09/22/2014    Past Surgical History:  Procedure Laterality Date  . APPENDECTOMY    . uterine tumor removal  2011    OB History    No data available       Home Medications    Prior to Admission medications   Medication Sig Start Date End Date Taking? Authorizing Provider  amLODipine (NORVASC) 10 MG tablet Take 10 mg by mouth daily.    Historical Provider, MD  aspirin EC 81 MG tablet Take 81 mg by mouth daily.    Historical Provider, MD  Multiple Vitamins-Minerals (HAIR/SKIN/NAILS) TABS Take 3 tablets by mouth daily.     Historical Provider, MD    Phenyleph-CPM-DM-APAP (ALKA-SELTZER PLUS COLD & COUGH) 01-11-09-325 MG CAPS Take 1 capsule by mouth daily as needed (cold).    Historical Provider, MD    Family History No family history on file.  Social History Social History  Substance Use Topics  . Smoking status: Current Every Day Smoker    Packs/day: 0.50    Types: Cigarettes  . Smokeless tobacco: Never Used  . Alcohol use Yes     Comment: socially     Allergies   Patient has no known allergies.   Review of Systems Review of Systems  Constitutional: Negative for chills and fever.  HENT: Positive for sore throat.   Eyes: Negative for photophobia.  Respiratory: Negative for cough, chest tightness and shortness of breath.   Cardiovascular: Negative for chest pain, palpitations and leg swelling.  Gastrointestinal: Negative for abdominal pain, diarrhea, nausea and vomiting.  Genitourinary: Negative for dysuria, flank pain, pelvic pain, vaginal bleeding, vaginal discharge and vaginal pain.  Musculoskeletal: Positive for neck pain and neck stiffness. Negative for myalgias.  Skin: Negative for rash.  Neurological: Positive for headaches. Negative for dizziness and weakness.  All other systems reviewed and are negative.    Physical Exam Updated Vital Signs BP (!) 140/107   Pulse 90   Temp 98.6 F (37 C) (Oral)   Resp 16   Ht 5' 4.5" (1.638 m)   Wt 59 kg   SpO2 100%   BMI 21.97 kg/m   Physical Exam  Constitutional:  She is oriented to person, place, and time. She appears well-developed and well-nourished. No distress.  HENT:  Head: Normocephalic.  Oropharynx erythematous. Left tonsil is enlarged, with exudate. Uvula is midline.  Eyes: Conjunctivae and EOM are normal. Pupils are equal, round, and reactive to light.  Neck: Neck supple.  Tenderness to palpation of left trapezius extending from the base of the skull to the left shoulder. Pain with range of motion of the neck. Pain also with range of motion of the left  shoulder. Negative Kernig sign  Cardiovascular: Normal rate, regular rhythm and normal heart sounds.   Pulmonary/Chest: Effort normal and breath sounds normal. No respiratory distress. She has no wheezes. She has no rales.  Abdominal: Soft. Bowel sounds are normal. She exhibits no distension. There is no tenderness. There is no rebound.  Musculoskeletal: Normal range of motion. She exhibits no edema.  Neurological: She is alert and oriented to person, place, and time.  Skin: Skin is warm and dry.  Psychiatric: She has a normal mood and affect. Her behavior is normal.  Nursing note and vitals reviewed.    ED Treatments / Results  Labs (all labs ordered are listed, but only abnormal results are displayed) Labs Reviewed  RAPID STREP SCREEN (NOT AT [facility])  CULTURE, GROUP A STREP [facility])    EKG  EKG Interpretation None       Radiology No results found.  Procedures Procedures (including critical care time)  Medications Ordered in ED Medications  magic mouthwash (5 mLs Oral Given 10/27/16 1419)  diazepam (VALIUM) tablet 5 mg (5 mg Oral Given 10/27/16 1419)  ketorolac (TORADOL) injection 30 mg (30 mg Intramuscular Given 10/27/16 1420)     Initial Impression / Assessment and Plan / ED Course  I have reviewed the triage vital signs and the nursing notes.  Pertinent labs & imaging results that were available during my care of the patient were reviewed by me and considered in my medical decision making (see chart for details).     Patient in emergency department with left trapezius pain, neck stiffness. She is concerned she may have meningitis. She does not have any photophobia, no fever, negative Kernig sign, highly doubt meningitis. Most definitely patient does not have bacterial meningitis. Patient's oropharynx is erythematous, left tonsil is enlarged with exudate, however uvula is midline, there is no trismus, doubt peritonsillar abscess. Rapid strep obtained and is negative.  Patient was treated with Toradol and Valium 5 mg  3:10 PM Patient feels better after medications. She is able to move her neck, with some pain. Suspect torticollis and tonsillitis. Plan to start on penicillin. Valium for muscle spasms. Naprosyn. Follow up with pcp. Return precautions discussed.   Vitals:   10/27/16 1132 10/27/16 1503  BP: (!) 164/125 (!) 140/107  Pulse: 99 90  Resp: 14 16  Temp: 98.6 F (37 C)   TempSrc: Oral   SpO2: 100% 100%  Weight: 59 kg   Height: 5' 4.5" (1.638 m)      Final Clinical Impressions(s) / ED Diagnoses   Final diagnoses:  Torticollis  Tonsillitis    New Prescriptions New Prescriptions   DIAZEPAM (VALIUM) 2 MG TABLET    Take 1-2 tablets (2-4 mg total) by mouth every 8 (eight) hours as needed for anxiety.   NAPROXEN (NAPROSYN) 500 MG TABLET    Take 1 tablet (500 mg total) by mouth 2 (two) times daily.   PENICILLIN V POTASSIUM (VEETID) 500 MG TABLET    Take 1 tablet (500  mg total) by mouth 4 (four) times daily.     Jeannett Senior, PA-C 10/27/16 1515    Isla Pence, MD 11/03/16 1331

## 2016-10-27 NOTE — ED Triage Notes (Signed)
Pt reports 3 days ago laying down under fan and reports left sided headache and neck stiffness. Pt states now she cannot move her neck and is having difficulty swallowing. C/o headache 9/10 on left side. Denies SOB, chest pain, changes in gait. Pt able to maintain saliva just reports pain when swallowing.

## 2016-10-27 NOTE — Discharge Instructions (Signed)
Take penicillin as prescribed until all gone for tonsil infection. Take naproxen for pain. Valium for muscle spasms. Try heating pads, stretches, gentle massage. Follow-up with family doctor. Return if worsening.

## 2016-10-30 LAB — CULTURE, GROUP A STREP (THRC)

## 2016-11-02 ENCOUNTER — Ambulatory Visit: Payer: Self-pay | Admitting: Family Medicine

## 2016-11-03 ENCOUNTER — Ambulatory Visit: Payer: Self-pay | Admitting: Student

## 2016-11-07 ENCOUNTER — Other Ambulatory Visit: Payer: Self-pay | Admitting: *Deleted

## 2016-11-07 MED ORDER — AMLODIPINE BESYLATE 10 MG PO TABS: 10.0000 mg | ORAL_TABLET | Freq: Every day | ORAL | 3 refills | Status: DC

## 2016-11-09 ENCOUNTER — Encounter: Payer: Self-pay | Admitting: Student

## 2016-11-09 ENCOUNTER — Ambulatory Visit (INDEPENDENT_AMBULATORY_CARE_PROVIDER_SITE_OTHER): Payer: Self-pay | Admitting: Student

## 2016-11-09 VITALS — BP 142/90 | Temp 98.4°F | Ht 64.5 in | Wt 126.8 lb

## 2016-11-09 DIAGNOSIS — J029 Acute pharyngitis, unspecified: Secondary | ICD-10-CM

## 2016-11-09 NOTE — Progress Notes (Signed)
   Subjective:    Patient ID: Cheryl Carlson, female    DOB: [birth year], 46 y.o.   MRN: [id number]   CC:ED follow up for sore throat and torticollis   HPI: 46 y/o F presents for ED follow up for sore throat and torticollis   Sore throat/Torticollis - in the ED she was started on penicilin for sore throat and valium for torticollis - rapid strep was negative - Her torticollis and ore throat have now resolved - no fevers, no difficulty swallowing or breathing - she has been taking the penicillin once daily instead of TID as prescribed   Smoking status reviewed  Review of Systems  Per HPI, else denies , chest pain, shortness of breath, abdominal pain, N/V/D    Objective:  BP (!) 142/90   Temp 98.4 F (36.9 C) (Oral)   Ht 5' 4.5" (1.638 m)   Wt 126 lb 12.8 oz (57.5 kg)   BMI 21.43 kg/m  Vitals and nursing note reviewed  General: NAD Cardiac: RRR, normal heart sounds, no murmurs. 2+ radial and PT pulses bilaterally Respiratory: CTAB, normal effort Abdomen: soft, nontender, nondistended, no hepatic or splenomegaly. Bowel sounds present Extremities: no edema or cyanosis. WWP. Skin: warm and dry, no rashes noted Neuro: alert and oriented, no focal deficits   Assessment & Plan:   Pharyngitis - now resolved - she has been counseled to take her antibiotics as prescribed if she takes them  Torticollis - now resolved - will continie prescribed valium as needed, she was given #15 tabs  Alyssa A. Lincoln Brigham MD, [facility] Resident PGY-3 Pager [phone]

## 2016-11-09 NOTE — Patient Instructions (Signed)
Follow up as needed If you have any questions or concerns, call the office at [phone]

## 2016-12-03 ENCOUNTER — Other Ambulatory Visit: Payer: Self-pay | Admitting: Family Medicine

## 2017-02-02 ENCOUNTER — Encounter: Payer: Self-pay | Admitting: Family Medicine

## 2017-02-02 ENCOUNTER — Ambulatory Visit (INDEPENDENT_AMBULATORY_CARE_PROVIDER_SITE_OTHER): Payer: Self-pay | Admitting: Family Medicine

## 2017-02-02 VITALS — BP 142/82 | HR 77 | Temp 98.5°F | Ht 64.5 in | Wt 125.6 lb

## 2017-02-02 DIAGNOSIS — R1310 Dysphagia, unspecified: Secondary | ICD-10-CM

## 2017-02-02 DIAGNOSIS — R232 Flushing: Secondary | ICD-10-CM

## 2017-02-02 DIAGNOSIS — I10 Essential (primary) hypertension: Secondary | ICD-10-CM

## 2017-02-02 MED ORDER — OMEPRAZOLE 20 MG PO CPDR: 20.0000 mg | DELAYED_RELEASE_CAPSULE | Freq: Every day | ORAL | 0 refills | Status: DC

## 2017-02-02 NOTE — Patient Instructions (Addendum)
It was good to see you again. If I do not talk to you prior to me leaving the clinic at the end of June, it was an honor to care for you. Start taking Prilosec or Protonix daily. If you do not note a resolution of your symptoms and continue to have difficulty with swallowing, let me know and I'll refer you to a GI doctor for further evaluation.  Follow up in 3 months for a blood pressure check or sooner as needed.  Black Cohosh, Cimicifuga racemosa oral dosage forms What is this medicine? BLACK COHOSH (blak KOH hosh) or Cimicifuga racemosa is a dietary supplement. It is promoted to relieve symptoms of menopause, such as hot flashes. The FDA has not approved this supplement for any medical use. This supplement may be used for other purposes; ask your health care provider or pharmacist if you have questions. This medicine may be used for other purposes; ask your health care provider or pharmacist if you have questions. What should I tell my health care provider before I take this medicine? They need to know if you have any of these conditions: -breast cancer -cervical, ovarian or uterine cancer -high blood pressure -infertility -liver disease -menstrual changes or irregular periods -unusual vaginal or uterine bleeding -an unusual or allergic reaction to black cohosh, soybeans, tartrazine dye (yellow dye number 5), other medicines, foods, dyes, or preservatives -pregnant or trying to get pregnant -breast-feeding How should I use this medicine? Take this herb by mouth with a glass of water. Follow the directions on the package labeling, or talk to your health care professional. Do not use for longer than 6 months without the advice of a health care professional. Do not use if you are pregnant or breast-feeding. Talk to your obstetrician-gynecologist or certified nurse-midwife. This herb is not for use in children under the age of 75 years. Overdosage: If you think you have taken too much of  this medicine contact a poison control center or emergency room at once. NOTE: This medicine is only for you. Do not share this medicine with others. What if I miss a dose? If you miss a dose, take it as soon as you can. If it is almost time for your next dose, take only that dose. Do not take double or extra doses. What may interact with this medicine? -atorvastatin -cisplatin -fertility treatments This list may not describe all possible interactions. Give your health care provider a list of all the medicines, herbs, non-prescription drugs, or dietary supplements you use. Also tell them if you smoke, drink alcohol, or use illegal drugs. Some items may interact with your medicine. What should I watch for while using this medicine? Since this herb is derived from a plant, allergic reactions are possible. Stop using this herb if you develop a rash. You may need to see your health care professional, or inform them that this occurred. Report any unusual side effects promptly. If you are taking this herb for menstrual or menopausal symptoms, visit your doctor or health care professional for regular checks on your progress. You should have a complete check-up every 6 months. You will need a regular breast and pelvic exam while on this therapy. Follow the advice of your doctor or health care professional. Women should inform their doctor if they wish to become pregnant or think they might be pregnant. If you have any reason to think you are pregnant, stop taking this herb at once and contact your doctor or health care professional.  Herbal or dietary supplements are not regulated like medicines. Rigid quality control standards are not required for dietary supplements. The purity and strength of these products can vary. The safety and effect of this dietary supplement for a certain disease or illness is not well known. This product is not intended to diagnose, treat, cure or prevent any disease. The Food and  Drug Administration suggests the following to help consumers protect themselves: -Always read product labels and follow directions. -Natural does not mean a product is safe for humans to take. -Look for products that include USP after the ingredient name. This means that the manufacturer followed the standards of the U.S. Pharmacopoeia. -Supplements made or sold by a nationally known food or drug company are more likely to be made under tight controls. You can write to the company for more information about how the product was made. What side effects may I notice from receiving this medicine? Side effects that you should report to your doctor or health care professional as soon as possible: -allergic reactions like skin rash, itching or hives, swelling of the face, lips, or tongue -breathing problems -dizziness -palpitations -signs and symptoms of liver injury like dark yellow or brown urine; general ill feeling or flu-like symptoms; light-colored stools; loss of appetite; nausea; right upper belly pain; unusually weak or tired; yellowing of the eyes or skin -unusual vaginal bleeding Side effects that usually do not require medical attention (report to your doctor or health care professional if they continue or are bothersome): -breast tenderness -headache -nausea -upset stomach This list may not describe all possible side effects. Call your doctor for medical advice about side effects. You may report side effects to FDA at 1-800-FDA-1088. Where should I keep my medicine? Keep out of the reach of children. Store at room temperature between 15 and 30 degrees C (59 and 86 degrees C). Throw away any unused herb after the expiration date. NOTE: This sheet is a summary. It may not cover all possible information. If you have questions about this medicine, talk to your doctor, pharmacist, or health care provider.  2018 Elsevier/Gold Standard (2016-03-09 14:35:09)

## 2017-02-02 NOTE — Assessment & Plan Note (Signed)
Blood pressure slightly elevated above goal today. Discussed the result patient. She will likely continue with her current regimen. -Continue amlodipine

## 2017-02-02 NOTE — Progress Notes (Signed)
Subjective: CC: f/u BP HPI: Patient is a 46 y.o. female with a past medical history of HTN, tobacco use, perimenopausal presenting to clinic today for f/u on HTN.   Difficulty swallowing-  Patient notes 2-4 weeks she's had difficulty swallowing. She notes this is with both solids and liquids. She feels the food gets stuck, however if she hits on her anterior chest wall, it goes down. She also noted dry cough.  She is concerned, as a friend of his had similar symptoms and ended up having cancer. Her father had some sort of throat cancer. She denies any weight loss. She has fevers and chills, however she attributes this to going through menopause She is a current smoker.  Hypertension Meds: Compliant with amlodipine Side effects: none  ROS: Denies headache, dizziness, visual changes, nausea, vomiting, chest pain, abdominal pain or shortness of breath.  Hot flashes: Notes it is more bothersome now. Curious if she can try anything for her symptoms. She'll would prefer something herbal  Social History: Current smoker   ROS: All other systems reviewed and are negative.  Past Medical History Patient Active Problem List   Diagnosis Date Noted  . Dysphagia 02/02/2017  . Hot flashes 02/02/2017  . Abnormal uterine bleeding (AUB) 01/07/2016  . 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 Intermenstrual bleeding 12/03/2015  . Night sweats 12/03/2015  . Essential hypertension, benign 09/22/2014  . GERD (gastroesophageal reflux disease) 09/22/2014  . Tobacco abuse 09/22/2014    Medications- reviewed and updated Current Outpatient Prescriptions  Medication Sig Dispense Refill  . amLODipine (NORVASC) 10 MG tablet Take 1 tablet (10 mg total) by mouth daily. 90 tablet 3  . amLODipine (NORVASC) 5 MG tablet TAKE 1 TABLET(5 MG) BY MOUTH DAILY 30 tablet 0  . aspirin EC 81 MG tablet Take 81 mg by mouth daily.    . diazepam (VALIUM) 2 MG tablet Take 1-2 tablets (2-4 mg total) by mouth every 8 (eight)  hours as needed for anxiety. 15 tablet 0  . Multiple Vitamins-Minerals (HAIR/SKIN/NAILS) TABS Take 3 tablets by mouth daily.     . naproxen (NAPROSYN) 500 MG tablet Take 1 tablet (500 mg total) by mouth 2 (two) times daily. 30 tablet 0  . omeprazole (PRILOSEC) 20 MG capsule Take 1 capsule (20 mg total) by mouth daily. 30 capsule 0  . Phenyleph-CPM-DM-APAP (ALKA-SELTZER PLUS COLD & COUGH) 01-11-09-325 MG CAPS Take 1 capsule by mouth daily as needed (cold).     No current facility-administered medications for this visit.     Objective: Office vital signs reviewed. BP (!) 142/82   Pulse 77   Temp 98.5 F (36.9 C) (Oral)   Ht 5' 4.5" (1.638 m)   Wt 125 lb 9.6 oz (57 kg)   SpO2 99%   BMI 21.23 kg/m    Physical Examination:  General: Awake, alert, well-nourished nourished, NAD ENMT:  MMM, Oropharynx clear without erythema or tonsillar exudate/hypertrophy. No thyromegaly or masses noted. Eyes: Conjunctiva non-injected.  Cardio: RRR, no m/r/g noted.  Pulm: No increased WOB.  CTAB, without wheezes, rhonchi or crackles noted. No stridor   Assessment/Plan: Dysphagia Patient has a history of acid reflux in the past. She is taking Prilosec previously with good results. She has not had any weight loss.  Risk factors to something like esophageal cancer include current smoker and a history of reflux. -We'll treat with Prilosec for 2 week time span, if she does not have complete resolution of symptoms, will refer to GI for possible EGD  Essential hypertension, benign Blood pressure slightly elevated above goal today. Discussed the result patient. She will likely continue with her current regimen. -Continue amlodipine  Hot flashes Most likely menopausal based on patient's previous lab work. Discussed black cohosh with the patient   No orders of the defined types were placed in this encounter.   Meds ordered this encounter  Medications  . omeprazole (PRILOSEC) 20 MG capsule    Sig: Take 1  capsule (20 mg total) by mouth daily.    Dispense:  30 capsule    Refill:  [place] PGY-3, [place]

## 2017-02-02 NOTE — Assessment & Plan Note (Signed)
Most likely menopausal based on patient's previous lab work. Discussed black cohosh with the patient

## 2017-02-02 NOTE — Assessment & Plan Note (Signed)
Patient has a history of acid reflux in the past. She is taking Prilosec previously with good results. She has not had any weight loss.  Risk factors to something like esophageal cancer include current smoker and a history of reflux. -We'll treat with Prilosec for 2 week time span, if she does not have complete resolution of symptoms, will refer to GI for possible EGD

## 2017-02-08 ENCOUNTER — Telehealth: Payer: Self-pay | Admitting: Family Medicine

## 2017-02-08 NOTE — Telephone Encounter (Signed)
Would like to talk to dr Lorenso Courier. Thinks she has tonsillitis again and needs antibotics. Please advise

## 2017-02-08 NOTE — Telephone Encounter (Signed)
Returned patient's call. She states that she was having hot flashes yesterday and had multiple fans on. She then notes yesterday evening she had significant sore throat. S  he was seen in urgent care a few weeks ago with tonsillitis. At that time, apparently the provider told her she may need to have her tonsils removed, so she is worried that she needs antibiotics and a referral for tonsillectomy.  Discussed that sometimes just having the cold dry air can cause a sore throat. It looks like rapid strep was negative at her last visit. Advised the patient to come into clinic for evaluation, as I'm unsure where she really requires antibiotics.  Patient scheduled with me same-day slot for tomorrow at 3:15.  [name], MD [facility] Resident  02/08/2017, 5:27 PM

## 2017-02-09 ENCOUNTER — Ambulatory Visit (INDEPENDENT_AMBULATORY_CARE_PROVIDER_SITE_OTHER): Payer: Self-pay | Admitting: Family Medicine

## 2017-02-09 ENCOUNTER — Encounter: Payer: Self-pay | Admitting: Family Medicine

## 2017-02-09 VITALS — BP 128/100 | HR 89 | Temp 99.4°F | Wt 122.2 lb

## 2017-02-09 DIAGNOSIS — J029 Acute pharyngitis, unspecified: Secondary | ICD-10-CM

## 2017-02-09 DIAGNOSIS — J0300 Acute streptococcal tonsillitis, unspecified: Secondary | ICD-10-CM

## 2017-02-09 LAB — POCT RAPID STREP A (OFFICE): RAPID STREP A SCREEN: POSITIVE — AB

## 2017-02-09 MED ORDER — AMOXICILLIN 500 MG PO CAPS: 500.0000 mg | ORAL_CAPSULE | Freq: Two times a day (BID) | ORAL | 0 refills | Status: DC

## 2017-02-09 NOTE — Progress Notes (Signed)
Subjective: CC: sore throat  HPI: Patient is a 46 y.o. female presenting to clinic today for a same day appointment for sore throat.  The patient states that 2 days ago she was having hot flashes therefore she had multiple fanson her. She then began having significant sore throat. She states it sometimes is difficult to swallow, but denies issues with drooling or swallowing saliva. She has been drinking warm tea which helps her symptoms. She also notes some tenderness to palpation over her neck.   She's worried, as she was seen in the emergency room in February due to torticollis and tonsillitis and was told that she may eventually need a tonsillectomy.  She denies cough, congestion, rhinorrhea, otalgias, rash, fevers, chills.   Social History: Current smoker   ROS: All other systems reviewed and are negative.  Past Medical History Patient Active Problem List   Diagnosis Date Noted  . Streptococcal tonsillitis 02/09/2017  . Dysphagia 02/02/2017  . Hot flashes 02/02/2017  . Abnormal uterine bleeding (AUB) 01/07/2016  . 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 Intermenstrual bleeding 12/03/2015  . Night sweats 12/03/2015  . Essential hypertension, benign 09/22/2014  . GERD (gastroesophageal reflux disease) 09/22/2014  . Tobacco abuse 09/22/2014    Medications- reviewed and updated Current Outpatient Prescriptions  Medication Sig Dispense Refill  . amLODipine (NORVASC) 10 MG tablet Take 1 tablet (10 mg total) by mouth daily. 90 tablet 3  . amLODipine (NORVASC) 5 MG tablet TAKE 1 TABLET(5 MG) BY MOUTH DAILY 30 tablet 0  . amoxicillin (AMOXIL) 500 MG capsule Take 1 capsule (500 mg total) by mouth 2 (two) times daily. 20 capsule 0  . aspirin EC 81 MG tablet Take 81 mg by mouth daily.    . diazepam (VALIUM) 2 MG tablet Take 1-2 tablets (2-4 mg total) by mouth every 8 (eight) hours as needed for anxiety. 15 tablet 0  . Multiple Vitamins-Minerals (HAIR/SKIN/NAILS) TABS Take 3 tablets by mouth  daily.     . naproxen (NAPROSYN) 500 MG tablet Take 1 tablet (500 mg total) by mouth 2 (two) times daily. 30 tablet 0  . omeprazole (PRILOSEC) 20 MG capsule Take 1 capsule (20 mg total) by mouth daily. 30 capsule 0  . Phenyleph-CPM-DM-APAP (ALKA-SELTZER PLUS COLD & COUGH) 01-11-09-325 MG CAPS Take 1 capsule by mouth daily as needed (cold).     No current facility-administered medications for this visit.     Objective: Office vital signs reviewed. BP (!) 128/100   Pulse 89   Temp 99.4 F (37.4 C) (Oral)   Wt 122 lb 3.2 oz (55.4 kg)   BMI 20.65 kg/m    Physical Examination:  General: Awake, alert, Well- nourished, appears mildly uncomfortable ENMT:  TMs intact, normal light reflex, no erythema, no bulging. Nasal turbinates moist. MMM, tonsils erythematous with overlying exudate, grade 3/4 tonsillar hypertrophy (not yet touching). Uvula midline. Neck: [place] anterior cervical lymphadenopathy, patient is tender to palpation Eyes: Conjunctiva non-injected.  Cardio: RRR, no m/r/g noted.  Pulm: No increased WOB.  CTAB, without wheezes, rhonchi or crackles noted. No stridor  Rapid strep a positive  Assessment/Plan: Streptococcal tonsillitis Exam and lab work consistent with streptococcal tonsillitis. No evidence of peritonsillar abscess on exam or history. Discussed that we can provide IM penicillin today versus an outpatient regimen with amoxicillin. The patient feels she can tolerate taking the amoxicillin.  Given that this is the second bout of tonsillitis in the last 3 months and the size of her tonsils (almost touching, no respiratory  symptoms or dysphagia currently) she may benefit from further evaluation for possible tonsillectomy?  I doubt she is a chronic colonizer of strep as the strep test from the emergency room in February was negative (most likely a viral etiology) -Amoxicillin 500 mg twice a day 10 days -We'll refer to ENT for further evaluation, would like to avoid surgery if  they deem it unnecessary. -Strict return precautions discussed with the patient    Orders Placed This Encounter  Procedures  . Ambulatory referral to ENT    Referral Priority:   Urgent    Referral Type:   Consultation    Referral Reason:   Specialty Services Required    Requested Specialty:   Otolaryngology    Number of Visits Requested:   1  . POCT rapid strep A    Meds ordered this encounter  Medications  . amoxicillin (AMOXIL) 500 MG capsule    Sig: Take 1 capsule (500 mg total) by mouth 2 (two) times daily.    Dispense:  20 capsule    Refill:  Cheryl Carlson, Cheryl Carlson

## 2017-02-09 NOTE — Patient Instructions (Signed)
I have referred you to ENT. I have prescribed amoxicillin 500mg  twice daily x 10 days.   Strep Throat Strep throat is a bacterial infection of the throat. Your health care provider may call the infection tonsillitis or pharyngitis, depending on whether there is swelling in the tonsils or at the back of the throat. Strep throat is most common during the cold months of the year in children who are 23-48 years of age, but it can happen during any season in people of any age. This infection is spread from person to person (contagious) through coughing, sneezing, or close contact. What are the causes? Strep throat is caused by the bacteria called Streptococcus pyogenes. What increases the risk? This condition is more likely to develop in:  People who spend time in crowded places where the infection can spread easily.  People who have close contact with someone who has strep throat.  What are the signs or symptoms? Symptoms of this condition include:  Fever or chills.  Redness, swelling, or pain in the tonsils or throat.  Pain or difficulty when swallowing.  White or yellow spots on the tonsils or throat.  Swollen, tender glands in the neck or under the jaw.  Red rash all over the body (rare).  How is this diagnosed? This condition is diagnosed by performing a rapid strep test or by taking a swab of your throat (throat culture test). Results from a rapid strep test are usually ready in a few minutes, but throat culture test results are available after one or two days. How is this treated? This condition is treated with antibiotic medicine. Follow these instructions at home: Medicines  Take over-the-counter and prescription medicines only as told by your health care provider.  Take your antibiotic as told by your health care provider. Do not stop taking the antibiotic even if you start to feel better.  Have family members who also have a sore throat or fever tested for strep throat.  They may need antibiotics if they have the strep infection. Eating and drinking  Do not share food, drinking cups, or personal items that could cause the infection to spread to other people.  If swallowing is difficult, try eating soft foods until your sore throat feels better.  Drink enough fluid to keep your urine clear or pale yellow. General instructions  Gargle with a salt-water mixture 3-4 times per day or as needed. To make a salt-water mixture, completely dissolve -1 tsp of salt in 1 cup of warm water.  Make sure that all household members wash their hands well.  Get plenty of rest.  Stay home from school or work until you have been taking antibiotics for 24 hours.  Keep all follow-up visits as told by your health care provider. This is important. Contact a health care provider if:  The glands in your neck continue to get bigger.  You develop a rash, cough, or earache.  You cough up a thick liquid that is green, yellow-brown, or bloody.  You have pain or discomfort that does not get better with medicine.  Your problems seem to be getting worse rather than better.  You have a fever. Get help right away if:  You have new symptoms, such as vomiting, severe headache, stiff or painful neck, chest pain, or shortness of breath.  You have severe throat pain, drooling, or changes in your voice.  You have swelling of the neck, or the skin on the neck becomes red and tender.  You  have signs of dehydration, such as fatigue, dry mouth, and decreased urination.  You become increasingly sleepy, or you cannot wake up completely.  Your joints become red or painful. This information is not intended to replace advice given to you by your health care provider. Make sure you discuss any questions you have with your health care provider. Document Released: 08/26/2000 Document Revised: 04/27/2016 Document Reviewed: 12/22/2014 Elsevier Interactive Patient Education  2017 [employer].

## 2017-02-09 NOTE — Assessment & Plan Note (Addendum)
Exam and lab work consistent with streptococcal tonsillitis. No evidence of peritonsillar abscess on exam or history. Discussed that we can provide IM penicillin today versus an outpatient regimen with amoxicillin. The patient feels she can tolerate taking the amoxicillin.  Given that this is the second bout of tonsillitis in the last 3 months and the size of her tonsils (almost touching, no respiratory symptoms or dysphagia currently) she may benefit from further evaluation for possible tonsillectomy?  I doubt she is a chronic colonizer of strep as the strep test from the emergency room in February was negative (most likely a viral etiology) -Amoxicillin 500 mg twice a day 10 days -We'll refer to ENT for further evaluation, would like to avoid surgery if they deem it unnecessary. -Strict return precautions discussed with the patient

## 2017-04-17 ENCOUNTER — Other Ambulatory Visit: Payer: Self-pay | Admitting: *Deleted

## 2017-04-17 MED ORDER — AMLODIPINE BESYLATE 5 MG PO TABS: ORAL_TABLET | ORAL | 0 refills | Status: DC

## 2017-05-22 NOTE — Progress Notes (Signed)
   Subjective:   Patient ID: Cheryl Carlson    DOB: [birth year], 46 y.o. female   MRN: [id number]  CC: "Sore throat"  HPI: Cheryl Carlson is a 46 y.o. female who presents to [facility] for sore throat. Problems discussed today are as follows:  Sore throat: Patient states she is having a "flare up" of her tonsillitis. She was diagnosed back in 01/2017 with positive step throat and adequately treated. She was also set up with ENT for a follow up appointment given multiple instances of tonsillitis over past year (this is her 3rd episode) but missed appointment due to inability to pay. Pain significantly improves with ibuprofen. Also endorsing cough with throat pain. Drinking coffee to help with pain. ROS: Fever or chill, nausea or vomiting, dysphagia, rhinorrhea, eye discharge or pruritis, headache.   Complete ROS performed, see HPI for pertinent.  [place]: Tobacco use disorder, GERD, HTN, AUB. Smoking status reviewed. Medications reviewed.  Objective:   BP 118/82   Pulse (!) 115   Temp 98.8 F (37.1 C) (Oral)   Ht 5\' 5"  (1.651 m)   Wt 128 lb 9.6 oz (58.3 kg)   SpO2 98%   BMI 21.40 kg/m  Vitals and nursing note reviewed.  General: well nourished, well developed, in no acute distress with non-toxic appearance HEENT: normocephalic, atraumatic, moist mucous membranes, pharyngeal tonsil enlarged bilaterally without kissing and minimal purulence Neck: supple, non-tender with no lymphadenopathy CV: regular rate and rhythm without murmurs, rubs, or gallops, no lower extremity edema Lungs: clear to auscultation bilaterally with normal work of breathing Skin: warm, dry, no rashes or lesions, cap refill < 2 seconds  Assessment & Plan:   Tonsillitis Chronic. No systemic symptoms. Meets 1 of Centor criteria for strep. Will differ abx at this time. Would likely benefit from tonsillectomy though lack of insurance is an obvious barrier. --Advised to call ENT and explain situation and need for  rescheduling --Continue ibuprofen PRN pain, salt water gargles, honey for conservative management and avoidance of acidic foods/beverages --RTC in 2 weeks if symptoms do not improve  No orders of the defined types were placed in this encounter.  No orders of the defined types were placed in this encounter.   Harriet [place], [place], PGY-2 05/23/2017 5:59 PM

## 2017-05-23 ENCOUNTER — Encounter: Payer: Self-pay | Admitting: Family Medicine

## 2017-05-23 ENCOUNTER — Ambulatory Visit (INDEPENDENT_AMBULATORY_CARE_PROVIDER_SITE_OTHER): Payer: Self-pay | Admitting: Family Medicine

## 2017-05-23 DIAGNOSIS — J039 Acute tonsillitis, unspecified: Secondary | ICD-10-CM

## 2017-05-23 NOTE — Assessment & Plan Note (Signed)
Chronic. No systemic symptoms. Meets 1 of Centor criteria for strep. Will differ abx at this time. Would likely benefit from tonsillectomy though lack of insurance is an obvious barrier. --Advised to call ENT and explain situation and need for rescheduling --Continue ibuprofen PRN pain, salt water gargles, honey for conservative management and avoidance of acidic foods/beverages --RTC in 2 weeks if symptoms do not improve

## 2017-05-23 NOTE — Patient Instructions (Addendum)
Thank you for coming in to see [place] today. Please see below to review our plan for today's visit.  Your symptoms are concerning for recurrent tonsillitis. Advise you to continue taking ibuprofen for pain. In full honey and warm tea help soothe your throat. Please call [facility] to schedule an appointment.   Please call the clinic at [phone] if your symptoms worsen or you have any concerns. It was my pleasure to see you. -- Harriet Butte, [place], PGY-2

## 2017-07-19 ENCOUNTER — Telehealth: Payer: Self-pay | Admitting: Family Medicine

## 2017-07-19 NOTE — Telephone Encounter (Signed)
Pt came to office stated he needs a refill for her Blood pressure medicine.  [phone]

## 2017-07-24 MED ORDER — AMLODIPINE BESYLATE 10 MG PO TABS: 10.0000 mg | ORAL_TABLET | Freq: Every day | ORAL | 3 refills | Status: DC

## 2017-07-24 NOTE — Telephone Encounter (Signed)
Family Medicine Telephone note  Called patient to ask which medication she needed. Confirmed that she only takes amlodipine. Sent new script to [facility] on [address]. Patient instructed to call if unable to pick up for any reason. She is in agreement with the plan.  Guadalupe Dawn MD PGY-1 Family Medicine Resident

## 2017-08-10 ENCOUNTER — Ambulatory Visit: Payer: Self-pay | Admitting: Family Medicine

## 2017-08-29 ENCOUNTER — Ambulatory Visit: Payer: Self-pay | Admitting: Family Medicine

## 2017-12-15 ENCOUNTER — Ambulatory Visit (INDEPENDENT_AMBULATORY_CARE_PROVIDER_SITE_OTHER): Payer: Self-pay

## 2017-12-15 ENCOUNTER — Ambulatory Visit (HOSPITAL_COMMUNITY)
Admission: EM | Admit: 2017-12-15 | Discharge: 2017-12-15 | Disposition: A | Discharge status: Home / Self Care | Payer: Self-pay | Attending: Family Medicine | Admitting: Family Medicine

## 2017-12-15 ENCOUNTER — Encounter (HOSPITAL_COMMUNITY): Payer: Self-pay | Admitting: Emergency Medicine

## 2017-12-15 DIAGNOSIS — J111 Influenza due to unidentified influenza virus with other respiratory manifestations: Secondary | ICD-10-CM

## 2017-12-15 MED ORDER — OSELTAMIVIR PHOSPHATE 75 MG PO CAPS: 75.0000 mg | ORAL_CAPSULE | Freq: Two times a day (BID) | ORAL | 0 refills | Status: DC

## 2017-12-15 NOTE — ED Triage Notes (Signed)
Pt sts cough with pain and body aches

## 2017-12-15 NOTE — ED Provider Notes (Signed)
Cheryl Carlson    CSN: [id number] Arrival date & time: 12/15/17  1635     History   Chief Complaint Chief Complaint  Patient presents with  . Cough    HPI Cheryl Carlson is a 47 y.o. female.   47 yo female here for body aches and cough x 2 days. She also has associated headache and says she feels like she's been run over by a truck. She has been taking mucinex cold and flu with some relief of symptoms. Nothing makes better or worse. No known sick contacts.     Past Medical History:  Diagnosis Date  . Hypertension   . Mitral valve prolapse     Patient Active Problem List   Diagnosis Date Noted  . Tonsillitis 05/23/2017  . Streptococcal tonsillitis 02/09/2017  . Dysphagia 02/02/2017  . Hot flashes 02/02/2017  . Abnormal uterine bleeding (AUB) 01/07/2016  . 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 Intermenstrual bleeding 12/03/2015  . Night sweats 12/03/2015  . Essential hypertension, benign 09/22/2014  . GERD (gastroesophageal reflux disease) 09/22/2014  . Tobacco abuse 09/22/2014    Past Surgical History:  Procedure Laterality Date  . APPENDECTOMY    . uterine tumor removal  2011    OB History   None      Home Medications    Prior to Admission medications   Medication Sig Start Date End Date Taking? Authorizing Provider  amLODipine (NORVASC) 10 MG tablet Take 1 tablet (10 mg total) daily by mouth. 07/24/17   Guadalupe Dawn, MD  amoxicillin (AMOXIL) 500 MG capsule Take 1 capsule (500 mg total) by mouth 2 (two) times daily. 02/09/17   [name], MD  aspirin EC 81 MG tablet Take 81 mg by mouth daily.    [provider]  diazepam (VALIUM) 2 MG tablet Take 1-2 tablets (2-4 mg total) by mouth every 8 (eight) hours as needed for anxiety. 10/27/16   Kirichenko, Lahoma Rocker, PA-C  Multiple Vitamins-Minerals (HAIR/SKIN/NAILS) TABS Take 3 tablets by mouth daily.     [provider]  naproxen (NAPROSYN) 500 MG tablet Take 1 tablet (500 mg total) by  mouth 2 (two) times daily. 10/27/16   Kirichenko, Lahoma Rocker, PA-C  omeprazole (PRILOSEC) 20 MG capsule Take 1 capsule (20 mg total) by mouth daily. 02/02/17   [name], MD  Phenyleph-CPM-DM-APAP (ALKA-SELTZER PLUS COLD & COUGH) 01-11-09-325 MG CAPS Take 1 capsule by mouth daily as needed (cold).    [provider]    Family History History reviewed. No pertinent family history.  Social History Social History   Tobacco Use  . Smoking status: Current Every Day Smoker    Packs/day: 0.50    Types: Cigarettes  . Smokeless tobacco: Never Used  Substance Use Topics  . Alcohol use: Yes    Comment: socially  . Drug use: No     Allergies   Patient has no known allergies.   Review of Systems Review of Systems  Constitutional: Negative for activity change and appetite change.  HENT: Negative for congestion and ear pain.   Eyes: Negative for discharge and itching.  Respiratory: Positive for cough. Negative for apnea.   Cardiovascular: Negative for chest pain and leg swelling.  Gastrointestinal: Negative for abdominal distention and abdominal pain.  Endocrine: Negative for cold intolerance and heat intolerance.  Genitourinary: Negative for difficulty urinating and dysuria.  Musculoskeletal: Positive for arthralgias. Negative for back pain.  Neurological: Positive for headaches. Negative for dizziness.  Hematological: Negative for adenopathy. Does not  bruise/bleed easily.     Physical Exam Triage Vital Signs ED Triage Vitals [12/15/17 1717]  Enc Vitals Group     BP (!) 135/93     Pulse Rate 89     Resp 18     Temp 98.9 F (37.2 C)     Temp Source Oral     SpO2 100 %     Weight      Height      Head Circumference      Peak Flow      Pain Score      Pain Loc      Pain Edu?      Excl. in [place]?    No data found.  Updated Vital Signs BP (!) 135/93 (BP Location: Right Arm)   Pulse 89   Temp 98.9 F (37.2 C) (Oral)   Resp 18   SpO2 100%   Visual  Acuity Right Eye Distance:   Left Eye Distance:   Bilateral Distance:    Right Eye Near:   Left Eye Near:    Bilateral Near:     Physical Exam  Constitutional: She is oriented to person, place, and time. She appears well-developed and well-nourished.  HENT:  Head: Normocephalic and atraumatic.  Eyes: Pupils are equal, round, and reactive to light. EOM are normal.  Neck: Normal range of motion. Neck supple.  Cardiovascular: Normal rate and intact distal pulses.  Pulmonary/Chest: Effort normal. No respiratory distress.  Musculoskeletal: Normal range of motion. She exhibits no edema.  Neurological: She is alert and oriented to person, place, and time.  Skin: Skin is warm and dry.  Psychiatric: She has a normal mood and affect. Her behavior is normal.     UC Treatments / Results  Labs (all labs ordered are listed, but only abnormal results are displayed) Labs Reviewed - No data to display  EKG None Radiology Dg Chest 2 View  Result Date: 12/15/2017 CLINICAL DATA:  Productive cough w/clear and reddish sputum with pain, dizziness, SoB, and body aches. Pt denies N/V, temp. Pt sts that left side feels worse. Numbness and tingling radiating into left arm Mitral valve prolapse. No known lung conditions. Smoker half a pack a day. Nondiabetic. EXAM: CHEST - 2 VIEW COMPARISON:  09/27/2016 FINDINGS: The heart size and mediastinal contours are within normal limits. Both lungs are clear. No pleural effusion or pneumothorax. The visualized skeletal structures are unremarkable. IMPRESSION: No active cardiopulmonary disease. Electronically Signed   By: Lajean Manes M.D.   On: 12/15/2017 17:44    Procedures Procedures (including critical care time)  Medications Ordered in UC Medications - No data to display   Initial Impression / Assessment and Plan / UC Course  I have reviewed the triage vital signs and the nursing notes.  Pertinent labs & imaging results that were available during my  care of the patient were reviewed by me and considered in my medical decision making (see chart for details).     1. Influenza- treat with tamiflu. Tessalon perls for cough. If worsening follow up with PCP.   Final Clinical Impressions(s) / UC Diagnoses   Final diagnoses:  None    ED Discharge Orders    None       Controlled Substance Prescriptions [employer] Controlled Substance Registry consulted? Not Applicable   Dannielle Huh, DO 12/15/17 [phone]

## 2018-01-05 ENCOUNTER — Ambulatory Visit: Payer: Self-pay | Admitting: Family Medicine

## 2018-04-04 ENCOUNTER — Emergency Department (HOSPITAL_COMMUNITY): Payer: Self-pay

## 2018-04-04 ENCOUNTER — Emergency Department (HOSPITAL_COMMUNITY)
Admission: EM | Admit: 2018-04-04 | Discharge: 2018-04-04 | Disposition: A | Discharge status: Home / Self Care | Payer: Self-pay | Attending: Emergency Medicine | Admitting: Emergency Medicine

## 2018-04-04 ENCOUNTER — Encounter (HOSPITAL_COMMUNITY): Payer: Self-pay | Admitting: Emergency Medicine

## 2018-04-04 DIAGNOSIS — F17210 Nicotine dependence, cigarettes, uncomplicated: Secondary | ICD-10-CM | POA: Insufficient documentation

## 2018-04-04 DIAGNOSIS — I10 Essential (primary) hypertension: Secondary | ICD-10-CM | POA: Insufficient documentation

## 2018-04-04 DIAGNOSIS — Z79899 Other long term (current) drug therapy: Secondary | ICD-10-CM | POA: Insufficient documentation

## 2018-04-04 DIAGNOSIS — Z7982 Long term (current) use of aspirin: Secondary | ICD-10-CM | POA: Insufficient documentation

## 2018-04-04 DIAGNOSIS — R0789 Other chest pain: Secondary | ICD-10-CM

## 2018-04-04 LAB — URINALYSIS, ROUTINE W REFLEX MICROSCOPIC
BILIRUBIN URINE: NEGATIVE
Glucose, UA: NEGATIVE mg/dL
Ketones, ur: NEGATIVE mg/dL
LEUKOCYTES UA: NEGATIVE
NITRITE: NEGATIVE
Protein, ur: NEGATIVE mg/dL
Specific Gravity, Urine: 1.008 (ref 1.005–1.030)
pH: 7 (ref 5.0–8.0)

## 2018-04-04 LAB — BASIC METABOLIC PANEL
Anion gap: 9 (ref 5–15)
BUN: 12 mg/dL (ref 6–20)
CHLORIDE: 105 mmol/L (ref 98–111)
CO2: 25 mmol/L (ref 22–32)
Calcium: 9.4 mg/dL (ref 8.9–10.3)
Creatinine, Ser: 0.61 mg/dL (ref 0.44–1.00)
GFR calc Af Amer: >60 mL/min (ref >60)
GFR calc non Af Amer: >60 mL/min (ref >60)
GLUCOSE: 104 mg/dL — AB (ref 70–99)
POTASSIUM: 3.9 mmol/L (ref 3.5–5.1)
Sodium: 139 mmol/L (ref 135–145)

## 2018-04-04 LAB — I-STAT TROPONIN, ED
Troponin i, poc: 0.01 ng/mL (ref 0.00–0.08)
Troponin i, poc: 0 ng/mL (ref 0.00–0.08)

## 2018-04-04 LAB — I-STAT BETA HCG BLOOD, ED (MC, WL, AP ONLY): I-stat hCG, quantitative: <5 m[IU]/mL (ref <5)

## 2018-04-04 LAB — CBC
HEMATOCRIT: 44.1 % (ref 36.0–46.0)
Hemoglobin: 13.9 g/dL (ref 12.0–15.0)
MCH: 28.2 pg (ref 26.0–34.0)
MCHC: 31.5 g/dL (ref 30.0–36.0)
MCV: 89.5 fL (ref 78.0–100.0)
Platelets: 399 10*3/uL (ref 150–400)
RBC: 4.93 MIL/uL (ref 3.87–5.11)
RDW: 15.6 % — ABNORMAL HIGH (ref 11.5–15.5)
WBC: 11.6 10*3/uL — ABNORMAL HIGH (ref 4.0–10.5)

## 2018-04-04 MED ORDER — MORPHINE SULFATE (PF) 4 MG/ML IV SOLN
4.0000 mg | Freq: Once | INTRAVENOUS | Status: AC
  Administered 2018-04-04: 4 mg via INTRAVENOUS
  Filled 2018-04-04: qty 1

## 2018-04-04 MED ORDER — AMLODIPINE BESYLATE 5 MG PO TABS
5.0000 mg | ORAL_TABLET | Freq: Once | ORAL | Status: AC
Start: 2018-04-04 — End: 2018-04-04
  Administered 2018-04-04: 5 mg via ORAL
  Filled 2018-04-04: qty 1

## 2018-04-04 NOTE — ED Provider Notes (Signed)
[facility] EMERGENCY DEPARTMENT Provider Note   CSN: [id number] Arrival date & time: 04/04/18  9528     History   Chief Complaint Chief Complaint  Patient presents with  . Chest Pain  . Headache    HPI Cheryl Carlson is a 47 y.o. female with past medical history of hypertension, MVP, GERD who presents for evaluation of chest discomfort that is been ongoing for last 2 weeks.  Patient reports that over the last 2 weeks, she has had an intermittent fluttering sensation.  She states that she is not having any pain and denies any pressure, sharp pain.  She states that this fluttering sensation will happen randomly and she has not noticed any particular event that triggers it.  She is to the last for a few minutes and then it will resolve on its own.  She states that this fluttering sensation occurs both at rest and while she was up doing activity.  She states that it is not worse with deep inspiration or worse with exertion.  She does not have any associated nausea, vomiting, diaphoresis.  She states she became concerned because her cousin recently died from heart attack last week and so she felt like she needed to get checked out.  She does not follow a cardiologist for her mitral valve prolapse.  Patient also reports that she has had a headache that began this morning.  She describes the headache as gradual in nature.  She states she has not taken any medication for the pain.  Patient denies any vision changes, difficulty breathing, abdominal pain, nausea/vomiting, numbness/weakness of her arms or legs.  She is a current smoker.  She denies any cocaine or IV drug use.  Patient states that she has a history of hypertension but denies any diabetes.  She reports that her cousin recently passed away from a heart attack at age 52 but denies any other personal cardiac history. She denies any OCP use, recent immobilization, prior history of DVT/PE, recent surgery, leg swelling, or long  travel.   The history is provided by the patient.    Past Medical History:  Diagnosis Date  . Hypertension   . Mitral valve prolapse     Patient Active Problem List   Diagnosis Date Noted  . Tonsillitis 05/23/2017  . Streptococcal tonsillitis 02/09/2017  . Dysphagia 02/02/2017  . Hot flashes 02/02/2017  . Abnormal uterine bleeding (AUB) 01/07/2016  . 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 Intermenstrual bleeding 12/03/2015  . Night sweats 12/03/2015  . Essential hypertension, benign 09/22/2014  . GERD (gastroesophageal reflux disease) 09/22/2014  . Tobacco abuse 09/22/2014    Past Surgical History:  Procedure Laterality Date  . APPENDECTOMY    . uterine tumor removal  2011     OB History   None      Home Medications    Prior to Admission medications   Medication Sig Start Date End Date Taking? Authorizing Provider  amLODipine (NORVASC) 10 MG tablet Take 1 tablet (10 mg total) daily by mouth. 07/24/17  Yes Guadalupe Dawn, MD  aspirin EC 81 MG tablet Take 81 mg by mouth daily.   Yes [provider]  cholecalciferol (VITAMIN D) 1000 units tablet Take 1,000 Units by mouth daily.   Yes [provider]  diazepam (VALIUM) 2 MG tablet Take 1-2 tablets (2-4 mg total) by mouth every 8 (eight) hours as needed for anxiety. 10/27/16  Yes Kirichenko, Tatyana, PA-C  Multiple Vitamins-Minerals (HAIR/SKIN/NAILS) TABS Take 1 tablet by  mouth daily.    Yes [provider]  vitamin C (ASCORBIC ACID) 500 MG tablet Take 1,000 mg by mouth daily.   Yes [provider]  amoxicillin (AMOXIL) 500 MG capsule Take 1 capsule (500 mg total) by mouth 2 (two) times daily. Patient not taking: Reported on 04/04/2018 02/09/17   [name], MD  naproxen (NAPROSYN) 500 MG tablet Take 1 tablet (500 mg total) by mouth 2 (two) times daily. Patient not taking: Reported on 04/04/2018 10/27/16   Jeannett Senior, PA-C  omeprazole (PRILOSEC) 20 MG capsule Take 1 capsule (20 mg  total) by mouth daily. Patient not taking: Reported on 04/04/2018 02/02/17   [name], MD  oseltamivir (TAMIFLU) 75 MG capsule Take 1 capsule (75 mg total) by mouth every 12 (twelve) hours. Patient not taking: Reported on 04/04/2018 12/15/17   Dannielle Huh, DO    Family History No family history on file.  Social History Social History   Tobacco Use  . Smoking status: Current Every Day Smoker    Packs/day: 0.50    Types: Cigarettes  . Smokeless tobacco: Never Used  Substance Use Topics  . Alcohol use: Yes    Comment: socially  . Drug use: No     Allergies   Patient has no known allergies.   Review of Systems Review of Systems  Constitutional: Negative for fever.  Eyes: Negative for visual disturbance.  Respiratory: Negative for cough and shortness of breath.   Cardiovascular: Positive for chest pain.  Gastrointestinal: Negative for abdominal pain, nausea and vomiting.  Genitourinary: Negative for dysuria and hematuria.  Neurological: Positive for headaches. Negative for weakness and numbness.  All other systems reviewed and are negative.    Physical Exam Updated Vital Signs BP (!) 131/93 (BP Location: Right Arm)   Pulse 74   Temp 98 F (36.7 C) (Oral)   Resp 16   SpO2 99%   Physical Exam  Constitutional: She is oriented to person, place, and time. She appears well-developed and well-nourished.  Sitting comfortably on examination table  HENT:  Head: Normocephalic and atraumatic.  Mouth/Throat: Oropharynx is clear and moist and mucous membranes are normal.  Eyes: Pupils are equal, round, and reactive to light. Conjunctivae, EOM and lids are normal.  Neck: Full passive range of motion without pain.  Cardiovascular: Normal rate, regular rhythm, normal heart sounds and normal pulses. Exam reveals no gallop and no friction rub.  No murmur heard. Pulmonary/Chest: Effort normal and breath sounds normal.  Abdominal: Soft. Normal appearance. There is no  tenderness. There is no rigidity and no guarding.  Musculoskeletal: Normal range of motion.  Neurological: She is alert and oriented to person, place, and time.  Cranial nerves III-XII intact Follows commands, Moves all extremities  5/5 strength to BUE and BLE  Sensation intact throughout all major nerve distributions Normal finger to nose. No dysdiadochokinesia. No pronator drift. No slurred speech. No facial droop.   Skin: Skin is warm and dry. Capillary refill takes less than 2 seconds.  Psychiatric: She has a normal mood and affect. Her speech is normal.  Nursing note and vitals reviewed.    ED Treatments / Results  Labs (all labs ordered are listed, but only abnormal results are displayed) Labs Reviewed  BASIC METABOLIC PANEL - Abnormal; Notable for the following components:      Result Value   Glucose, Bld 104 (*)    All other components within normal limits  CBC - Abnormal; Notable for the following components:  WBC 11.6 (*)    RDW 15.6 (*)    All other components within normal limits  URINALYSIS, ROUTINE W REFLEX MICROSCOPIC - Abnormal; Notable for the following components:   Color, Urine STRAW (*)    Hgb urine dipstick SMALL (*)    Bacteria, UA RARE (*)    All other components within normal limits  I-STAT TROPONIN, ED  I-STAT BETA HCG BLOOD, ED ([facility], [facility], [facility] ONLY)  I-STAT TROPONIN, ED    EKG EKG Interpretation  Date/Time:  Wednesday April 04 2018 05:20:30 EDT Ventricular Rate:  79 PR Interval:  158 QRS Duration: 74 QT Interval:  394 QTC Calculation: 451 R Axis:   102 Text Interpretation:  Normal sinus rhythm Rightward axis Septal infarct , age undetermined Abnormal ECG Confirmed by [name] [phone]) on 04/04/2018 5:54:27 AM Also confirmed by [name] [phone]), editor Lynder Parents [phone])  on 04/04/2018 7:10:47 AM   Radiology Dg Chest 2 View  Result Date: 04/04/2018 CLINICAL DATA:  Chest pain EXAM: CHEST - 2 VIEW COMPARISON:  12/15/2017  FINDINGS: The heart size and mediastinal contours are within normal limits. Both lungs are clear. The visualized skeletal structures are unremarkable. IMPRESSION: No active cardiopulmonary disease. Electronically Signed   By: Lucienne Capers M.D.   On: 04/04/2018 05:48    Procedures Procedures (including critical care time)  Medications Ordered in ED Medications  morphine 4 MG/ML injection 4 mg (4 mg Intravenous Given 04/04/18 0734)  amLODipine (NORVASC) tablet 5 mg (5 mg Oral Given 04/04/18 7517)     Initial Impression / Assessment and Plan / ED Course  I have reviewed the triage vital signs and the nursing notes.  Pertinent labs & imaging results that were available during my care of the patient were reviewed by me and considered in my medical decision making (see chart for details).     47 year old female with possible history of MVP, hypertension who presents for evaluation of chest discomfort that is been ongoing for last few weeks and headache that began this morning.  States it is more of a fluttering sensation rather than pain.  No associated diaphoresis, nausea, or eating. Patient is afebrile, non-toxic appearing, sitting comfortably on examination table. Vital signs reviewed and stable.  No neuro deficits noted on exam.  Consider ACS etiology versus infectious etiology versus muscular skeletal pain versus anxiety.  Plan to check labs, chest x-ray, EKG.  History/physical exam is not concerning for PE.  History/physical exam are not concerning  for CVA, ICH, dissection.  Do not suspect hypertensive emergency given history/physical exam.  Initial troponin negative.  I-STAT beta negative.  BMP unremarkable.  UA negative for any acute infectious etiology.  CBC shows slight leukocytosis.  Otherwise unremarkable.  X-ray negative for any acute infectious etiology.  Given patient's risk factors and presentation, she has a heart score of 3.  We plan to check delta troponin.  Reevaluation.   Patient reports improvement in pain after analgesics here in the ED.  Her vital signs are stable.  Patient was given a dose of amlodipine which improved her blood pressure.  Repeat troponin is negative.  At this time, do not suspect ACS etiology as the source of patient's pain.  Her presentation is very typical.  She states she is not having pain but more of a fluttering sensation.  At this time, patient is hemodynamically stable.  Do not suspect ACS etiology or hypertensive emergency.  We will plan to give her outpatient cardiology referral to follow-up with on  an outpatient basis.  At this time, patient exhibits no acute emergent life threatening condition that would require further intervention here in the ED. Patient had ample opportunity for questions and discussion. All patient's questions were answered with full understanding. Strict return precautions discussed. Patient expresses understanding and agreement to plan.   Final Clinical Impressions(s) / ED Diagnoses   Final diagnoses:  Essential hypertension  Chest discomfort    ED Discharge Orders    None       Desma Mcgregor 04/04/18 1726    Mesner, Corene Cornea, MD 04/06/18 2338

## 2018-04-04 NOTE — Discharge Instructions (Addendum)
Take your blood pressure medication as directed.   Follow-up with your primary care doctor or the referred [facility] for further evaluation.  As we discussed, please follow-up with referred [facility] cardiology group for further evaluation of your fluttering sensation.  Return the emergency department for any chest pain, difficulty breathing, numbness/weakness of your arms or legs, vision changes, headache, difficulty walking or any other worsening or concerning symptoms.

## 2018-04-04 NOTE — ED Notes (Signed)
Pt stats she understands instructions. Home stable with steady gait.

## 2018-04-04 NOTE — ED Triage Notes (Signed)
Pt reports she is a "heart patient" states chest pain started earlier this week, pt states she woke up with a headache early this am and a feeling of "fluttering" in her chest, states she has a mitral valve prolapse so the fluttering is not new for her. Reports pain goes to left arm and some sob. nad

## 2018-04-13 ENCOUNTER — Encounter: Payer: Self-pay | Admitting: Family Medicine

## 2018-04-13 ENCOUNTER — Other Ambulatory Visit: Payer: Self-pay

## 2018-04-13 ENCOUNTER — Ambulatory Visit: Payer: Self-pay | Admitting: Family Medicine

## 2018-04-13 VITALS — BP 144/92 | HR 65 | Temp 98.2°F | Ht 65.0 in | Wt 157.6 lb

## 2018-04-13 DIAGNOSIS — I10 Essential (primary) hypertension: Secondary | ICD-10-CM

## 2018-04-13 DIAGNOSIS — R079 Chest pain, unspecified: Secondary | ICD-10-CM

## 2018-04-13 DIAGNOSIS — I739 Peripheral vascular disease, unspecified: Secondary | ICD-10-CM

## 2018-04-13 MED ORDER — AMLODIPINE BESYLATE 10 MG PO TABS: 10.0000 mg | ORAL_TABLET | Freq: Every day | ORAL | 3 refills | Status: DC

## 2018-04-13 NOTE — Patient Instructions (Addendum)
It was great meeting you today! I am sorry that you have been having so much trouble with your chest pain. Definitely keep your cardiology referral as I believe they will want to do a rhythm monitor and a stress test to better evaluate your chest pain. Your blood pressure is a little elevated. We came to the conclusion that you will try and get this down with lifestyle changes. For your feet and leg pain, this sounds consistent with a possible narrowing of the blood vessels in your feet or legs. I would like to do an ultrasound of your arteries as well as a test known as an ankle brachial index. I will call you with these results. Please come back and see me in around 1 month for a blood pressure recheck as well as discuss the chest pain after cardiology.

## 2018-04-16 ENCOUNTER — Ambulatory Visit (HOSPITAL_COMMUNITY)
Admission: RE | Admit: 2018-04-16 | Discharge: 2018-04-16 | Disposition: A | Discharge status: Home / Self Care | Payer: Self-pay | Source: Ambulatory Visit | Attending: Family Medicine | Admitting: Family Medicine

## 2018-04-16 ENCOUNTER — Encounter (HOSPITAL_COMMUNITY): Payer: Self-pay

## 2018-04-16 DIAGNOSIS — I739 Peripheral vascular disease, unspecified: Secondary | ICD-10-CM

## 2018-04-16 DIAGNOSIS — I10 Essential (primary) hypertension: Secondary | ICD-10-CM | POA: Insufficient documentation

## 2018-04-16 DIAGNOSIS — F17200 Nicotine dependence, unspecified, uncomplicated: Secondary | ICD-10-CM | POA: Insufficient documentation

## 2018-04-16 DIAGNOSIS — R200 Anesthesia of skin: Secondary | ICD-10-CM | POA: Insufficient documentation

## 2018-04-19 ENCOUNTER — Encounter: Payer: Self-pay | Admitting: Family Medicine

## 2018-04-19 DIAGNOSIS — R079 Chest pain, unspecified: Secondary | ICD-10-CM | POA: Insufficient documentation

## 2018-04-19 DIAGNOSIS — I739 Peripheral vascular disease, unspecified: Secondary | ICD-10-CM | POA: Insufficient documentation

## 2018-04-19 NOTE — Progress Notes (Signed)
   HPI 47 year old who presents for chest pain. Recently seen in the ED on 7/24. Workup performed without any obvious cause. Patient has cardiology referral and appt on 8/15. She has been having the chest pain which is a sharp sensation that starts in her medial right chest and goes away with time.  Patient also having pain in her feet and legs with activity. She states that this occurs exclusively when she is using her legs for a prolonged period of time.  She also presents for elevated blood pressures.  CC: chest pain follow up   ROS:   Review of Systems See HPI for ROS.   CC, SH/smoking status, and VS noted  Objective: BP (!) 144/92   Pulse 65   Temp 98.2 F (36.8 C) (Oral)   Ht 5\' 5"  (1.651 m)   Wt 157 lb 9.6 oz (71.5 kg)   SpO2 99%   BMI 26.23 kg/m  Gen: NAD, alert, cooperative, and pleasant. Pleasant AA female resting comfortably HEENT: NCAT, EOMI, PERRL CV: RRR, no murmur Resp: CTAB, no wheezes, non-labored Abd: SNTND, BS present, no guarding or organomegaly Ext: No edema, warm Neuro: Alert and oriented, Speech clear, No gross deficits Pulses: Palpable dp/pt bilaterally  Assessment and plan:  Essential hypertension, benign BP 144/92. Patient would like to continue lifestyle modifications to get blood pressure down. Continue amlodipine. Recheck blood pressure in 1 month.  Claudication [facility]) Patient with symptoms consistent with claudication especially in context of smoking history. Will get BLE ABI and BLE arterial duplex.  Chest pain Follow up with cardiology in mid august. Will follow up with me after that appointment.   No orders of the defined types were placed in this encounter.   Meds ordered this encounter  Medications  . amLODipine (NORVASC) 10 MG tablet    Sig: Take 1 tablet (10 mg total) by mouth daily.    Dispense:  90 tablet    Refill:  3    Guadalupe Dawn MD PGY-2 Family Medicine Resident  04/19/2018 6:40 PM

## 2018-04-19 NOTE — Assessment & Plan Note (Signed)
Patient with symptoms consistent with claudication especially in context of smoking history. Will get BLE ABI and BLE arterial duplex.

## 2018-04-19 NOTE — Assessment & Plan Note (Signed)
Follow up with cardiology in mid august. Will follow up with me after that appointment.

## 2018-04-19 NOTE — Assessment & Plan Note (Signed)
BP 144/92. Patient would like to continue lifestyle modifications to get blood pressure down. Continue amlodipine. Recheck blood pressure in 1 month.

## 2018-04-26 ENCOUNTER — Encounter: Payer: Self-pay | Admitting: Physician Assistant

## 2018-04-26 ENCOUNTER — Ambulatory Visit (INDEPENDENT_AMBULATORY_CARE_PROVIDER_SITE_OTHER): Payer: Self-pay | Admitting: Physician Assistant

## 2018-04-26 VITALS — BP 136/86 | HR 76 | Ht 65.0 in | Wt 158.0 lb

## 2018-04-26 DIAGNOSIS — I341 Nonrheumatic mitral (valve) prolapse: Secondary | ICD-10-CM

## 2018-04-26 DIAGNOSIS — R0789 Other chest pain: Secondary | ICD-10-CM

## 2018-04-26 DIAGNOSIS — R002 Palpitations: Secondary | ICD-10-CM

## 2018-04-26 DIAGNOSIS — I10 Essential (primary) hypertension: Secondary | ICD-10-CM

## 2018-04-26 NOTE — Patient Instructions (Addendum)
Medication Instructions: Almyra Deforest, PA recommends that you continue on your current medications as directed. Please refer to the Current Medication list given to you today.  Labwork: Your physician recommends that you return for lab work TODAY.  Testing/Procedures: 1. Exercise Tolerance Test - Your physician has requested that you have an exercise tolerance test. For further information please visit HugeFiesta.[email or web address]. Please also follow instruction sheet, as given.  2. Echocardiogram - Your physician has requested that you have an echocardiogram. Echocardiography is a painless test that uses sound waves to create images of your heart. It provides your doctor with information about the size and shape of your heart and how well your heart's chambers and valves are working. This procedure takes approximately one hour. There are no restrictions for this procedure.  3. 14 Day Cardiac Event Monitor - Your physician has recommended that you wear an event monitor. Event monitors are medical devices that record the heart's electrical activity. Doctors most often [place] these monitors to diagnose arrhythmias. Arrhythmias are problems with the speed or rhythm of the heartbeat. The monitor is a small, portable device. You can wear one while you do your normal daily activities. This is usually used to diagnose what is causing palpitations/syncope (passing out).  >>These tests will be performed at our [facility] location [place], [place] [address] [phone]  Follow-up: Isaac Laud recommends that you schedule a follow-up appointment in 3-4 months with Dr Harrell Gave.  If you need a refill on your cardiac medications before your next appointment, please call your pharmacy.

## 2018-04-26 NOTE — Progress Notes (Signed)
Cardiology Office Note    Date:  04/28/2018   ID:  Cheryl Carlson [birth year], MRN [id number]  PCP:  Cheryl Dawn, MD  Cardiologist:  New - Dr. Harrell Carlson  Chief Complaint  Patient presents with  . New Patient (Initial Visit)    case discussed with Dr. Harrell Carlson  . Headache  . Atrial Flutter    At night.  . Edema    Feet and ankles.    History of Present Illness:  Cheryl Carlson is a 47 y.o. female with PMH of HTN, MVP, GERD and chronic tobacco abuse with 27-year history of smoking presented today for evaluation of chest pain and palpitation.  Patient does have significant family history of early CAD with her brother having MI and underwent ICD implantation at age 46.  She had a remote history of mitral valve prolapse diagnosed by Dr. Sherald Carlson around 1996.  She says that she is very active and does both boxing and soft defensive class.  She denies any chest discomfort during exertion.  Her chest pain usually occur with emotional stress.  I recommended a plain old treadmill test.  Given her history of mitral valve prolapse, I will obtain an echocardiogram as well.  As far as her palpitation, she says this only occurred after she started her menopause.  She recently presented to the ED for evaluation of chest pain and palpitation.  Troponin was negative.  Pregnancy test negative.  EKG does not show significant changes.  She was referred to cardiology service for further work-up.  There is no prior history of atrial fibrillation or atrial flutter based on the previous EKG.  I recommended a TSH and 2-week event monitor.  The case has been discussed with Dr. Harrell Carlson who also agrees.   Past Medical History:  Diagnosis Date  . Hypertension   . Mitral valve prolapse   . Mitral valve prolapse     Past Surgical History:  Procedure Laterality Date  . APPENDECTOMY    . uterine tumor removal  2011    Current Medications: Outpatient Medications Prior to Visit  Medication Sig  Dispense Refill  . amLODipine (NORVASC) 10 MG tablet Take 1 tablet (10 mg total) by mouth daily. 90 tablet 3  . amoxicillin (AMOXIL) 500 MG capsule Take 1 capsule (500 mg total) by mouth 2 (two) times daily. 20 capsule 0  . aspirin EC 81 MG tablet Take 81 mg by mouth daily.    . cholecalciferol (VITAMIN D) 1000 units tablet Take 1,000 Units by mouth daily.    . diazepam (VALIUM) 2 MG tablet Take 1-2 tablets (2-4 mg total) by mouth every 8 (eight) hours as needed for anxiety. 15 tablet 0  . Multiple Vitamins-Minerals (HAIR/SKIN/NAILS) TABS Take 1 tablet by mouth daily.     . naproxen (NAPROSYN) 500 MG tablet Take 1 tablet (500 mg total) by mouth 2 (two) times daily. 30 tablet 0  . omeprazole (PRILOSEC) 20 MG capsule Take 1 capsule (20 mg total) by mouth daily. 30 capsule 0  . oseltamivir (TAMIFLU) 75 MG capsule Take 1 capsule (75 mg total) by mouth every 12 (twelve) hours. 10 capsule 0  . vitamin C (ASCORBIC ACID) 500 MG tablet Take 1,000 mg by mouth daily.     No facility-administered medications prior to visit.      Allergies:   Patient has no known allergies.   Social History   Socioeconomic History  . Marital status: Legally Separated    Spouse name: Not on  file  . Number of children: Not on file  . Years of education: Not on file  . Highest education level: Not on file  Occupational History  . Not on file  Social Needs  . Financial resource strain: Not on file  . Food insecurity:    Worry: Not on file    Inability: Not on file  . Transportation needs:    Medical: Not on file    Non-medical: Not on file  Tobacco Use  . Smoking status: Current Every Day Smoker    Packs/day: 0.50    Types: Cigarettes  . Smokeless tobacco: Never Used  Substance and Sexual Activity  . Alcohol use: Yes    Comment: socially  . Drug use: No  . Sexual activity: Not on file  Lifestyle  . Physical activity:    Days per week: Not on file    Minutes per session: Not on file  . Stress: Not on  file  Relationships  . Social connections:    Talks on phone: Not on file    Gets together: Not on file    Attends religious service: Not on file    Active member of club or organization: Not on file    Attends meetings of clubs or organizations: Not on file    Relationship status: Not on file  Other Topics Concern  . Not on file  Social History Narrative  . Not on file     Family History:  The patient's family history includes Heart attack (age of onset: 24) in her brother; Heart disease in her mother; Hypertension in her father and mother.   ROS:   Please see the history of present illness.    ROS All other systems reviewed and are negative.   PHYSICAL EXAM:   VS:  BP 136/86 (BP Location: Left Arm, Patient Position: Sitting, Cuff Size: Normal)   Pulse 76   Ht 5\' 5"  (1.651 m)   Wt 158 lb (71.7 kg)   BMI 26.29 kg/m    GEN: Well nourished, well developed, in no acute distress  HEENT: normal  Neck: no JVD, carotid bruits, or masses Cardiac: RRR; no murmurs, rubs, or gallops,no edema  Respiratory:  clear to auscultation bilaterally, normal work of breathing GI: soft, nontender, nondistended, + BS MS: no deformity or atrophy  Skin: warm and dry, no rash Neuro:  Alert and Oriented x 3, Strength and sensation are intact Psych: euthymic mood, full affect  Wt Readings from Last 3 Encounters:  04/26/18 158 lb (71.7 kg)  04/13/18 157 lb 9.6 oz (71.5 kg)  05/23/17 128 lb 9.6 oz (58.3 kg)      Studies/Labs Reviewed:   EKG:  EKG is ordered today.  The ekg ordered today demonstrates normal sinus rhythm, no significant ST-T wave changes  Recent Labs: 04/04/2018: BUN 12; Creatinine, Ser 0.61; Hemoglobin 13.9; Platelets 399; Potassium 3.9; Sodium 139 04/26/2018: TSH 0.650   Lipid Panel    Component Value Date/Time   CHOL 170 11/30/2015 0904   TRIG 107 11/30/2015 0904   HDL 64 11/30/2015 0904   CHOLHDL 2.7 11/30/2015 0904   VLDL 21 11/30/2015 0904   LDLCALC 85 11/30/2015  0904    Additional studies/ records that were reviewed today include:   LE ABI 04/16/2018 Final Interpretation: Right: Resting right ankle-brachial index is within normal range. No evidence of significant right lower extremity arterial disease. The right toe-brachial index is normal.  Left: Resting left ankle-brachial index is within normal range. No  evidence of significant   ASSESSMENT:    1. Heart palpitations   2. Atypical chest pain   3. Mitral valve prolapse   4. Essential hypertension      PLAN:  In order of problems listed above:  1. Palpitation: Obtain 2 weeks event monitor.  Check TSH as well.  Her palpitation symptoms to have increased after menopause, which may represent atrial tachycardia instead of irregular rhythm.  Will confirm on the event monitor.  2. Atypical chest pain: Does not occur with exertion.  Patient is fairly active does boxing and a self-defense of class without noticing exertional chest discomfort.  I recommend plain old treadmill in this case  3. Mitral valve prolapse: I do not appreciate significant heart murmur on physical exam.  However patient carries a prior diagnosis of mitral valve prolapse diagnosed in 1996 by her previous cardiologist.  I recommended echocardiogram  4. Hypertension: Blood pressure well controlled.    Medication Adjustments/Labs and Tests Ordered: Current medicines are reviewed at length with the patient today.  Concerns regarding medicines are outlined above.  Medication changes, Labs and Tests ordered today are listed in the Patient Instructions below. Patient Instructions  Medication Instructions: Cheryl Deforest, PA recommends that you continue on your current medications as directed. Please refer to the Current Medication list given to you today.  Labwork: Your physician recommends that you return for lab work TODAY.  Testing/Procedures: 1. Exercise Tolerance Test - Your physician has requested that you have an exercise  tolerance test. For further information please visit HugeFiesta.[email or web address]. Please also follow instruction sheet, as given.  2. Echocardiogram - Your physician has requested that you have an echocardiogram. Echocardiography is a painless test that uses sound waves to create images of your heart. It provides your doctor with information about the size and shape of your heart and how well your heart's chambers and valves are working. This procedure takes approximately one hour. There are no restrictions for this procedure.  3. 14 Day Cardiac Event Monitor - Your physician has recommended that you wear an event monitor. Event monitors are medical devices that record the heart's electrical activity. Doctors most often [place] these monitors to diagnose arrhythmias. Arrhythmias are problems with the speed or rhythm of the heartbeat. The monitor is a small, portable device. You can wear one while you do your normal daily activities. This is usually used to diagnose what is causing palpitations/syncope (passing out).  >>These tests will be performed at our [facility] location [place], [place] [address] [phone]  Follow-up: Cheryl Carlson recommends that you schedule a follow-up appointment in 3-4 months with Dr Cheryl Carlson.  If you need a refill on your cardiac medications before your next appointment, please call your pharmacy.    Cheryl Carlson, [place]  04/28/2018 12:37 PM    [address], [place], [place]  [address] Phone: [phone]; Fax: [phone]

## 2018-04-27 LAB — TSH: TSH: 0.65 u[IU]/mL (ref 0.450–4.500)

## 2018-04-28 ENCOUNTER — Encounter: Payer: Self-pay | Admitting: Physician Assistant

## 2018-04-28 NOTE — Addendum Note (Signed)
Addended byEulas Post, Dalayah Deahl on: 04/28/2018 12:41 PM   Modules accepted: Level of Service

## 2018-05-03 ENCOUNTER — Ambulatory Visit (INDEPENDENT_AMBULATORY_CARE_PROVIDER_SITE_OTHER): Payer: Self-pay

## 2018-05-03 ENCOUNTER — Ambulatory Visit (HOSPITAL_COMMUNITY): Payer: Self-pay | Attending: Cardiovascular Disease

## 2018-05-03 ENCOUNTER — Other Ambulatory Visit: Payer: Self-pay

## 2018-05-03 DIAGNOSIS — I10 Essential (primary) hypertension: Secondary | ICD-10-CM | POA: Insufficient documentation

## 2018-05-03 DIAGNOSIS — R002 Palpitations: Secondary | ICD-10-CM

## 2018-05-03 DIAGNOSIS — I341 Nonrheumatic mitral (valve) prolapse: Secondary | ICD-10-CM | POA: Insufficient documentation

## 2018-05-03 DIAGNOSIS — Z720 Tobacco use: Secondary | ICD-10-CM | POA: Insufficient documentation

## 2018-05-07 NOTE — Progress Notes (Signed)
Mailing patient a copy of her normal labs.

## 2018-05-08 ENCOUNTER — Telehealth (HOSPITAL_COMMUNITY): Payer: Self-pay

## 2018-05-08 NOTE — Telephone Encounter (Signed)
Encounter complete. 

## 2018-05-09 ENCOUNTER — Telehealth (HOSPITAL_COMMUNITY): Payer: Self-pay

## 2018-05-09 NOTE — Telephone Encounter (Signed)
Encounter complete. 

## 2018-05-10 ENCOUNTER — Ambulatory Visit (HOSPITAL_COMMUNITY)
Admission: RE | Admit: 2018-05-10 | Discharge: 2018-05-10 | Disposition: A | Discharge status: Home / Self Care | Payer: Self-pay | Source: Ambulatory Visit | Attending: Cardiology | Admitting: Cardiology

## 2018-05-10 DIAGNOSIS — R0789 Other chest pain: Secondary | ICD-10-CM | POA: Insufficient documentation

## 2018-05-10 LAB — EXERCISE TOLERANCE TEST
CSEPHR: 94 %
Estimated workload: 10.3 METS
Exercise duration (min): 9 min
Exercise duration (sec): 9 s
MPHR: 171 {beats}/min
Peak HR: 162 {beats}/min
RPE: 19
Rest HR: 80 {beats}/min

## 2018-05-16 ENCOUNTER — Ambulatory Visit (INDEPENDENT_AMBULATORY_CARE_PROVIDER_SITE_OTHER): Payer: Self-pay

## 2018-05-16 ENCOUNTER — Telehealth: Payer: Self-pay | Admitting: Family Medicine

## 2018-05-16 DIAGNOSIS — Z111 Encounter for screening for respiratory tuberculosis: Secondary | ICD-10-CM

## 2018-05-16 NOTE — Telephone Encounter (Signed)
Pt called checking to see if we got this form. We have received the form and it is in [employer]. Pt has requested that is be signed and faxed back by 12:00 today. I told pt it may not get done today, but we would try our best. Please advise

## 2018-05-16 NOTE — Telephone Encounter (Signed)
Form completed and placed in nurse's box in mail room. Please let the patient know this is ready.  Guadalupe Dawn MD PGY-2 Family Medicine Resident

## 2018-05-16 NOTE — Progress Notes (Signed)
Tuberculin skin test applied to right ventral forearm.  Patient informed to schedule appt for nurse visit in 48-72 hours to have site read.  Wallace Cullens, RN

## 2018-05-16 NOTE — Telephone Encounter (Signed)
Pt is going to be faxing over a medical statment form for Dr. Kris Mouton to sign and wanted to let him know.

## 2018-05-17 NOTE — Telephone Encounter (Signed)
Left voicemail for patient that form faxed to number on form and original at front desk for pick up. Copy made for batch scanning. Danley Danker, RN [facility] RN)

## 2018-05-18 ENCOUNTER — Ambulatory Visit (INDEPENDENT_AMBULATORY_CARE_PROVIDER_SITE_OTHER): Payer: Self-pay

## 2018-05-18 DIAGNOSIS — Z111 Encounter for screening for respiratory tuberculosis: Secondary | ICD-10-CM

## 2018-05-18 LAB — TB SKIN TEST
Induration: 0 mm
TB Skin Test: NEGATIVE

## 2018-05-18 NOTE — Progress Notes (Signed)
   Patient here today to have PPD site read.   PPD read and results entered in Epic. Result: 0 mm induration. Interpretation: Negative Letter given for employer.  Rene Gonsoulin, RN ([facility] RN) 

## 2018-05-20 ENCOUNTER — Encounter: Payer: Self-pay | Admitting: Family Medicine

## 2018-05-21 ENCOUNTER — Encounter: Payer: Self-pay | Admitting: Family Medicine

## 2018-05-21 ENCOUNTER — Ambulatory Visit (HOSPITAL_COMMUNITY)
Admission: RE | Admit: 2018-05-21 | Discharge: 2018-05-21 | Disposition: A | Discharge status: Home / Self Care | Payer: Self-pay | Source: Ambulatory Visit | Attending: Family Medicine | Admitting: Family Medicine

## 2018-05-21 ENCOUNTER — Ambulatory Visit (INDEPENDENT_AMBULATORY_CARE_PROVIDER_SITE_OTHER): Payer: Self-pay | Admitting: Family Medicine

## 2018-05-21 ENCOUNTER — Other Ambulatory Visit: Payer: Self-pay

## 2018-05-21 VITALS — BP 124/92 | HR 89 | Temp 98.4°F | Ht 65.0 in | Wt 157.4 lb

## 2018-05-21 DIAGNOSIS — M79671 Pain in right foot: Secondary | ICD-10-CM | POA: Insufficient documentation

## 2018-05-21 DIAGNOSIS — M79672 Pain in left foot: Secondary | ICD-10-CM | POA: Insufficient documentation

## 2018-05-21 NOTE — Patient Instructions (Signed)
Powerstep Insert-- [place] or [employer]--$20   Be sure to get ones with arch support   BILATERAL NIGHT SPLINTS   Each day, freeze 2-3 water bottles   When you wake up, tip toe to the freezer, remove one, put it under your feet, roll your feet on this for 20 minutes   Treat your self to a new pair of shoes!

## 2018-05-21 NOTE — Progress Notes (Signed)
Patient Name: Cheryl Carlson Date of Birth: May 16, 1971 Date of Visit: 05/21/18 PCP: Guadalupe Dawn, MD  Chief Complaint: foot pain   Subjective: Cheryl Carlson is a pleasant 47 y.o. year old woman with a history of hypertension presenting for bilateral foot pain.  Cheryl Carlson was recently seen by cardiology for palpitations.  She reports she could not tolerate the event monitor as this caused a significant dermatitis on her anterior chest wall.  Her skin is recovering.  In good news all of her results appear to be within normal limits she is pleased by this.  She has had resolution of her symptoms. Denies any palpitations today.   Cheryl Carlson reports bilateral foot pain.  This is a new problem.  Her right foot is actually painful along the plantar surface near the calcaneus.  This is worse in the morning.  This is worse when she takes the first few steps.  She works all day on her feet and has been slowly increasing her exercise.  She uses [place] or [place] to walk or run on the treadmill.  She denies swelling, injury, nocturnal pain.  She has tried icing and this has helped.  Her left foot is actually painful the dorsal aspect along the fourth and fifth metatarsal.  Again there is no swelling, injury, nocturnal pain.    Cheryl Carlson blood pressure was slightly elevated her last visit she has been taking her Norvasc.  Her blood pressure today is at goal.  She is pleased with this.    ROS:  ROS Negative except for as above I have reviewed the patient's medical, surgical, family, and social history as appropriate.   Vitals:   05/21/18 1057  BP: (!) 124/92  Pulse: 89  Temp: 98.4 F (36.9 C)  SpO2: 99%   Filed Weights   05/21/18 1057  Weight: 157 lb 6.4 oz (71.4 kg)    HEENT: Sclera anicteric. Dentition is moderate. Appears well hydrated. Neck: Supple Cardiac: Regular rate and rhythm. Normal S1/S2. No murmurs, rubs, or gallops appreciated. Lungs: Clear bilaterally to ascultation.     Gait antalgic favoring the left foot.  Without her shoes on her bilateral arches are slightly dropped and she does demonstrate some valgus deformity as well as pes planus.  Tenderness to palpation along the medial plantar aspect of the calcaneus of the right foot.  Also tenderness to palpation along the base of the fifth metatarsal along the left foot and along the fourth metatarsal.  There is no swelling.  There is no redness.  Negative hop test.    Cheryl Carlson was seen today for check up for cardiologist and foot pain.  Diagnoses and all orders for this visit:  Bilateral foot pain, plantar fascitis, possible metatarsalgia, peroneal tendinopathy less likely stress fracture.  I think this new problem represents 2 different problems.  I do think both sides probably have a component of plantar fasciitis.  The right foot certainly seems to have plantar fasciitis.  Her left foot pain is more consistent with metatarsalgia although given her increase in activity and her pain on exam possibly a stress fracture.  We will obtain an x-ray.  For her plantar fasciitis we discussed a number of therapies including the following: Icing regularly with water bottles, night splints, Tylenol inappropriately just ibuprofen given her hypertension if this persists we will consider referral to sports medicine and/or physical therapy. -     DG Foot Complete Left; Future  Hypertension, elevated at last visit, BP noted  today, discussed, at goal.   Palpitations, discussed recent evaluation. Symptoms improved.   Dorris Singh, MD  Family Medicine Teaching Service

## 2018-07-31 ENCOUNTER — Ambulatory Visit (INDEPENDENT_AMBULATORY_CARE_PROVIDER_SITE_OTHER): Payer: No Typology Code available for payment source | Admitting: Cardiology

## 2018-07-31 ENCOUNTER — Encounter: Payer: Self-pay | Admitting: Cardiology

## 2018-07-31 VITALS — BP 140/78 | HR 75 | Ht 64.0 in | Wt 162.6 lb

## 2018-07-31 DIAGNOSIS — Z7189 Other specified counseling: Secondary | ICD-10-CM

## 2018-07-31 DIAGNOSIS — R072 Precordial pain: Secondary | ICD-10-CM

## 2018-07-31 DIAGNOSIS — I10 Essential (primary) hypertension: Secondary | ICD-10-CM

## 2018-07-31 NOTE — Progress Notes (Signed)
Cardiology Office Note:    Date:  07/31/2018   ID:  Cheryl Carlson, DOB 16-Mar-1971, MRN [id number]  PCP:  Guadalupe Dawn, MD  Cardiologist:  Buford Dresser, MD PhD  Referring MD: Guadalupe Dawn, MD   CC: chest pain  History of Present Illness:    Cheryl Carlson is a 47 y.o. female with a hx of HTN, MVP, GERD, tobacco abuse who is seen in follow up at the request of Guadalupe Dawn, MD for the evaluation and management of chest pain. She was last seen by Almyra Deforest on 04/26/18. At that time, sje was having chest discomfort.   She has significant family history of CAD; brother had MI and had ICD implanted at age 26.   At her prior visit, she had a 2 week monitor placed for palpitations, treadmill exercise for chest pain, and echocardiogram for mitral valve prolapse. All testing was normal.  Today, she is still having chest pain and palpitations. She was actively having both today when ECG taken; ECG shows normal sinus rhythm without ST changes.   Her main pain is left arm weakness/tightness, she gets a sharp shooting sensation. The pain in her chest is all across her left side, not related to exertion, sharp in nature, nearly constant when it occurs. No radiation. No nausea, shortness of breath, or diaphoresis. Worse with moving her arm.  Past Medical History:  Diagnosis Date  . Hypertension   . Mitral valve prolapse     Past Surgical History:  Procedure Laterality Date  . APPENDECTOMY    . uterine tumor removal  2011    Current Medications: Current Outpatient Medications on File Prior to Visit  Medication Sig  . amLODipine (NORVASC) 10 MG tablet Take 1 tablet (10 mg total) by mouth daily.  Marland Kitchen aspirin EC 81 MG tablet Take 81 mg by mouth daily.  . cholecalciferol (VITAMIN D) 1000 units tablet Take 1,000 Units by mouth daily.  . Multiple Vitamins-Minerals (HAIR/SKIN/NAILS) TABS Take 1 tablet by mouth daily.   . vitamin C (ASCORBIC ACID) 500 MG tablet Take 1,000 mg by  mouth daily.   No current facility-administered medications on file prior to visit.      Allergies:   Patient has no known allergies.   Social History   Socioeconomic History  . Marital status: Legally Separated    Spouse name: Not on file  . Number of children: Not on file  . Years of education: Not on file  . Highest education level: Not on file  Occupational History  . Not on file  Social Needs  . Financial resource strain: Not on file  . Food insecurity:    Worry: Not on file    Inability: Not on file  . Transportation needs:    Medical: Not on file    Non-medical: Not on file  Tobacco Use  . Smoking status: Current Every Day Smoker    Packs/day: 0.50    Types: Cigarettes  . Smokeless tobacco: Never Used  Substance and Sexual Activity  . Alcohol use: Yes    Comment: socially  . Drug use: No  . Sexual activity: Not on file  Lifestyle  . Physical activity:    Days per week: Not on file    Minutes per session: Not on file  . Stress: Not on file  Relationships  . Social connections:    Talks on phone: Not on file    Gets together: Not on file    Attends religious service:  Not on file    Active member of club or organization: Not on file    Attends meetings of clubs or organizations: Not on file    Relationship status: Not on file  Other Topics Concern  . Not on file  Social History Narrative  . Not on file     Family History: The patient's family history includes Heart attack (age of onset: 4) in her brother; Heart disease in her mother; Hypertension in her father and mother.  ROS:   Please see the history of present illness.  Additional pertinent ROS:  Constitutional: Negative for chills, fever, night sweats, unintentional weight loss  HENT: Negative for ear pain and hearing loss.   Eyes: Negative for loss of vision and eye pain.  Respiratory: Negative for cough, sputum, shortness of breath, wheezing.   Cardiovascular: Positive for chest and arm pain,  palpitaitons. Negative for PND, orthopnea, lower extremity edema and claudication.  Gastrointestinal: Negative for abdominal pain, melena, and hematochezia.  Genitourinary: Negative for dysuria and hematuria.  Musculoskeletal: Negative for falls and myalgias.  Skin: Negative for itching and rash.  Neurological: Negative for loss of consciousness. Positive for pain and intermittent weakness of the left arm Endo/Heme/Allergies: Does not bruise/bleed easily.    EKGs/Labs/Other Studies Reviewed:    The following studies were reviewed today:  05/10/18 ETT  Blood pressure demonstrated a normal response to exercise.  There was no ST segment deviation noted during stress.   1. Good exercise tolerance.  2. No ischemic ST segment changes.   06/01/18 Monitor Monitor worn for 3 days 15 hours. No afib or significant arrhythmia detected. Less than 1% ectopic beats. Baseline is sinus rhythm at 66 bpm. 12 patient triggered events, all were sinus rhythm or sinus tachycardia. Of note, 20% of her recording was sinus tachycardia, remaining 80% was normal sinus rhythm.  05/03/18 Echo  Study Conclusions - Left ventricle: The cavity size was normal. Systolic function was   normal. The estimated ejection fraction was in the range of 60%   to 65%. Wall motion was normal; there were no regional wall   motion abnormalities. Left ventricular diastolic function   parameters were normal. - Aortic valve: Transvalvular velocity was within the normal range.   There was no stenosis. There was no regurgitation. - Mitral valve: Transvalvular velocity was within the normal range.   There was no evidence for stenosis. There was no regurgitation. - Right ventricle: The cavity size was normal. Wall thickness was   normal. Systolic function was normal. - Atrial septum: No defect or patent foramen ovale was identified   by color flow Doppler. - Tricuspid valve: There was trivial regurgitation. - Pulmonary arteries:  Systolic pressure was within the normal   range. PA peak pressure: 23 mm Hg (S).   EKG:  EKG is personally reviewed.  The ekg ordered today demonstrates normal sinus rhythm, lack of R waves in V1-V2  Recent Labs: 04/04/2018: BUN 12; Creatinine, Ser 0.61; Hemoglobin 13.9; Platelets 399; Potassium 3.9; Sodium 139 04/26/2018: TSH 0.650  Recent Lipid Panel    Component Value Date/Time   CHOL 170 11/30/2015 0904   TRIG 107 11/30/2015 0904   HDL 64 11/30/2015 0904   CHOLHDL 2.7 11/30/2015 0904   VLDL 21 11/30/2015 0904   LDLCALC 85 11/30/2015 0904    Physical Exam:    VS:  BP 140/78   Pulse 75   Ht 5\' 4"  (1.626 m)   Wt 162 lb 9.6 oz (73.8 kg)  BMI 27.91 kg/m     Wt Readings from Last 3 Encounters:  07/31/18 162 lb 9.6 oz (73.8 kg)  05/21/18 157 lb 6.4 oz (71.4 kg)  04/26/18 158 lb (71.7 kg)     GEN: Well nourished, well developed in no acute distress HEENT: Normal NECK: No JVD; No carotid bruits LYMPHATICS: No lymphadenopathy CARDIAC: regular rhythm, normal S1 and S2, no murmurs, rubs, gallops. Radial and DP pulses 2+ bilaterally. RESPIRATORY:  Clear to auscultation without rales, wheezing or rhonchi  ABDOMEN: Soft, non-tender, non-distended MUSCULOSKELETAL:  No edema; No deformity. Tenderness and palpable muscular knot over left deltoid/bicep area SKIN: Warm and dry NEUROLOGIC:  Alert and oriented x 3 PSYCHIATRIC:  Normal affect   ASSESSMENT:    1. Precordial pain   2. Counseling on health promotion and disease prevention   3. Essential hypertension, benign    PLAN:    1. Precordial pain: all cardiac workup has been unremarkable. ECG today captured during active chest pain/fluttering and shows NSR. Given her left arm tenderness/tingling and occasional weakness, would investigate nerve pain and/or MSK pain as etiology.  2. Prevention and health counseling -recommend heart healthy/Mediterranean diet, with whole grains, fruits, vegetable, fish, lean meats, nuts, and  olive oil. Limit salt. -recommend moderate walking, 3-5 times/week for 30-50 minutes each session. Aim for at least 150 minutes.week. Goal should be pace of 3 miles/hours, or walking 1.5 miles in 30 minutes -recommend avoidance of tobacco products. Avoid excess alcohol. -Additional risk factor control:  -Diabetes: A1c is not recently available, prior 5.3 in 2016  -Lipids: from 2017: HDL 64, LDL 85, TG 107, Tchol 170  -Blood pressure control: in pain today. Aim for <140/90 or ideally <130/80  -Weight: BMI 27.9 -ASCVD risk score: The 10-year ASCVD risk score Mikey Bussing DC Brooke Bonito., et al., 2013) is: 6.3%   Values used to calculate the score:     Age: 34 years     Sex: Female     Is Non-Hispanic African American: Yes     Diabetic: No     Tobacco smoker: Yes     Systolic Blood Pressure: 824 mmHg     Is BP treated: Yes     HDL Cholesterol: 64 mg/dL     Total Cholesterol: 170 mg/dL   Plan for follow up: one year  Medication Adjustments/Labs and Tests Ordered: Current medicines are reviewed at length with the patient today.  Concerns regarding medicines are outlined above.  Orders Placed This Encounter  Procedures  . EKG 12-Lead   No orders of the defined types were placed in this encounter.   Patient Instructions  Medication Instructions:  Your Physician recommend you continue on your current medication as directed.    If you need a refill on your cardiac medications before your next appointment, please call your pharmacy.   Lab work: None  Testing/Procedures: None  Follow-Up: At [facility], you and your health needs are our priority.  As part of our continuing mission to provide you with exceptional heart care, we have created designated Provider Care Teams.  These Care Teams include your primary Cardiologist (physician) and Advanced Practice Providers (APPs -  Physician Assistants and Nurse Practitioners) who all work together to provide you with the care you need, when you need  it. You will need a follow up appointment in 1 years.  Please call our office 2 months in advance to schedule this appointment.  You may see Dr. Harrell Gave or one of the following Advanced Practice Providers on  your designated Care Team:   Rosaria Ferries, PA-C . Jory Sims, DNP, ANP  Any Other Special Instructions Will Be Listed Below (If Applicable).       Signed, Buford Dresser, MD PhD 07/31/2018 10:07 AM    [place]

## 2018-07-31 NOTE — Patient Instructions (Signed)
Medication Instructions:  Your Physician recommend you continue on your current medication as directed.    If you need a refill on your cardiac medications before your next appointment, please call your pharmacy.   Lab work: None  Testing/Procedures: None  Follow-Up: At [facility], you and your health needs are our priority.  As part of our continuing mission to provide you with exceptional heart care, we have created designated Provider Care Teams.  These Care Teams include your primary Cardiologist (physician) and Advanced Practice Providers (APPs -  Physician Assistants and Nurse Practitioners) who all work together to provide you with the care you need, when you need it. You will need a follow up appointment in 1 years.  Please call our office 2 months in advance to schedule this appointment.  You may see Dr. Harrell Gave or one of the following Advanced Practice Providers on your designated Care Team:   Rosaria Ferries, PA-C . Jory Sims, DNP, ANP  Any Other Special Instructions Will Be Listed Below (If Applicable).

## 2018-10-12 ENCOUNTER — Encounter: Payer: Self-pay | Admitting: Family Medicine

## 2018-10-12 ENCOUNTER — Ambulatory Visit (INDEPENDENT_AMBULATORY_CARE_PROVIDER_SITE_OTHER): Payer: PRIVATE HEALTH INSURANCE | Admitting: Family Medicine

## 2018-10-12 ENCOUNTER — Other Ambulatory Visit: Payer: Self-pay

## 2018-10-12 VITALS — BP 128/82 | HR 90 | Temp 98.8°F | Ht 64.0 in | Wt 161.8 lb

## 2018-10-12 DIAGNOSIS — R1310 Dysphagia, unspecified: Secondary | ICD-10-CM

## 2018-10-12 DIAGNOSIS — R1319 Other dysphagia: Secondary | ICD-10-CM

## 2018-10-12 NOTE — Patient Instructions (Signed)
It was great seeing you again today!  Sorry to hear about your friend passing away, and this is very difficult for you.  Your lung exam sounds good and you have no risk factors that make me think you are susceptible to a bad pneumonia.  In regards to your trouble swallowing I will send a referral for GI to do a scope.  You are about due for colonoscopy anyways so hopefully they can take care of both at the same time.

## 2018-10-12 NOTE — Progress Notes (Signed)
   HPI 48 year old female who presents for checkup and trouble swallowing.  She stated that recently she had a friend that passed away from pneumonia.  She is concerned about this is just wanted to "get checked out".  Had no respiratory symptoms whatsoever.  She has had no wheezing, coughing, runny nose, congestion, or anything else.  She does state that she occasionally has some trouble swallowing.  This typically occurs with larger pills and she thinks that maybe is got little worse recently.  It never occurs with liquids.  She states occasionally it just feels like the "food had a hard time get down".  When asked where she feels this sensation at she points to an area deep in her chest.  CC: Difficulty swallowing   ROS:   Review of Systems See HPI for ROS.   CC, SH/smoking status, and VS noted  Objective: BP 128/82   Pulse 90   Temp 98.8 F (37.1 C) (Oral)   Ht 5\' 4"  (1.626 m)   Wt 161 lb 12.8 oz (73.4 kg)   SpO2 99%   BMI 27.77 kg/m  Gen: Well-appearing 48 year old, African-American female, resting comfortably, no acute distress HEENT: No exudates or erythema in posterior pharynx or tonsils. CV: RRR, no murmur Resp: CTAB, no wheezes, non-labored Abd: SNTND, BS present, no guarding or organomegaly Neuro: Alert and oriented, Speech clear, No gross deficits   Assessment and plan:  Dysphagia Patient with sign symptoms of esophageal dysphagia.  States that she is having trouble getting large pills and food down sometimes.  Patient request GI referral for evaluation.  Differential includes lower esophageal sphincter abnormality, esophageal web, or decreased peristalsis.  Discussed this could possibly be caused by GERD.  Offered PPI but patient did not want to stating that she would like to try an herbal remedy.  Patient is almost due for colonoscopy given that she is close to 48 years old, will place referral for colonoscopy as well. -Referral to GI -Discussed starting PPI at  next visit   Orders Placed This Encounter  Procedures  . Ambulatory referral to Gastroenterology    Referral Priority:   Routine    Referral Type:   Consultation    Referral Reason:   Specialty Services Required    Number of Visits Requested:   1    No orders of the defined types were placed in this encounter.    Guadalupe Dawn MD PGY-2 Family Medicine Resident  10/12/2018 12:12 PM

## 2018-10-12 NOTE — Assessment & Plan Note (Addendum)
Patient with sign symptoms of esophageal dysphagia.  States that she is having trouble getting large pills and food down sometimes.  Patient request GI referral for evaluation.  Differential includes lower esophageal sphincter abnormality, esophageal web, or decreased peristalsis.  Discussed this could possibly be caused by GERD.  Offered PPI but patient did not want to stating that she would like to try an herbal remedy.  Patient is almost due for colonoscopy given that she is close to 48 years old, will place referral for colonoscopy as well. -Referral to GI -Discussed starting PPI at next visit

## 2018-10-17 ENCOUNTER — Encounter: Payer: Self-pay | Admitting: Physician Assistant

## 2018-10-29 ENCOUNTER — Encounter: Payer: Self-pay | Admitting: Physician Assistant

## 2018-10-29 ENCOUNTER — Ambulatory Visit (INDEPENDENT_AMBULATORY_CARE_PROVIDER_SITE_OTHER): Payer: PRIVATE HEALTH INSURANCE | Admitting: Physician Assistant

## 2018-10-29 VITALS — BP 90/60 | HR 80 | Ht 65.0 in | Wt 162.4 lb

## 2018-10-29 DIAGNOSIS — K219 Gastro-esophageal reflux disease without esophagitis: Secondary | ICD-10-CM | POA: Diagnosis not present

## 2018-10-29 DIAGNOSIS — R1310 Dysphagia, unspecified: Secondary | ICD-10-CM

## 2018-10-29 MED ORDER — OMEPRAZOLE 40 MG PO CPDR: 40.0000 mg | DELAYED_RELEASE_CAPSULE | ORAL | 11 refills | Status: DC

## 2018-10-29 NOTE — Progress Notes (Addendum)
Subjective:    Patient ID: Cheryl Carlson, female    DOB: [birth year], 48 y.o.   MRN: [id number]  HPI Cheryl Carlson is a pleasant 48 year old African-American female, new to GI today [facility]/Dr. Kris Mouton for evaluation of dysphasia and also to consider colon screening. Says she has not had any prior GI evaluation.  She has had some heartburn and indigestion off and on for years as her symptoms have worsened over the past months.  She frequently has been waking up with coughing and choking.  During the daytime she has been having indigestion off and on throughout the day depending on her intake.  She  also noticed dysphagia primarily to solids over the past 2 to 3 months.  Symptoms are occurring with every meal.  She says food feels as if it is "sticking" in her mid chest, then gradually will go on down to her stomach.  She has not had any episodes requiring regurgitation.  She occasionally will have symptoms with liquids if she drinks too fast.  Appetite has been fine, weight has been stable.  She has noticed intermittent issues with pills as well. No complaints of abdominal pain, changes in bowel habits melena or hematochezia.  Family history is negative for colon cancer and polyps as far she is aware.  She does mention that she has had some blood-tinged sputum recently as well.  When specifically asked whether she is coughing up sputum streaked with blood or regurgitating or vomiting material with blood she says she is coughing has a lot of sputum and intermittently has been seeing some red blood streaked.  She is a smoker.  Review of Systems Pertinent positive and negative review of systems were noted in the above HPI section.  All other review of systems was otherwise negative.  Outpatient Encounter Medications as of 10/29/2018  Medication Sig  . amLODipine (NORVASC) 10 MG tablet Take 1 tablet (10 mg total) by mouth daily.  Marland Kitchen aspirin EC 81 MG tablet Take 81 mg by mouth daily.    . cholecalciferol (VITAMIN D) 1000 units tablet Take 1,000 Units by mouth daily.  . Multiple Vitamins-Minerals (HAIR/SKIN/NAILS) TABS Take 1 tablet by mouth daily.   . vitamin C (ASCORBIC ACID) 500 MG tablet Take 1,000 mg by mouth daily.  Marland Kitchen omeprazole (PRILOSEC) 40 MG capsule Take 1 capsule (40 mg total) by mouth every morning. Take 30 minutes before breakfast   No facility-administered encounter medications on file as of 10/29/2018.    No Known Allergies Patient Active Problem List   Diagnosis Date Noted  . Claudication ([place]) 04/19/2018  . Chest pain 04/19/2018  . Dysphagia 02/02/2017  . Hot flashes 02/02/2017  . Abnormal uterine bleeding (AUB) 01/07/2016  . 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 Intermenstrual bleeding 12/03/2015  . Night sweats 12/03/2015  . Essential hypertension, benign 09/22/2014  . GERD (gastroesophageal reflux disease) 09/22/2014  . Tobacco abuse 09/22/2014   Social History   Socioeconomic History  . Marital status: Legally Separated    Spouse name: Not on file  . Number of children: 1  . Years of education: Not on file  . Highest education level: Not on file  Occupational History  . Occupation: Cusodial  Social Needs  . Financial resource strain: Not on file  . Food insecurity:    Worry: Not on file    Inability: Not on file  . Transportation needs:    Medical: Not on file    Non-medical: Not on file  Tobacco  Use  . Smoking status: Current Every Day Smoker    Packs/day: 0.50    Types: Cigarettes  . Smokeless tobacco: Never Used  Substance and Sexual Activity  . Alcohol use: Yes    Comment: socially  . Drug use: No  . Sexual activity: Not on file  Lifestyle  . Physical activity:    Days per week: Not on file    Minutes per session: Not on file  . Stress: Not on file  Relationships  . Social connections:    Talks on phone: Not on file    Gets together: Not on file    Attends religious service: Not on file    Active member of club or  organization: Not on file    Attends meetings of clubs or organizations: Not on file    Relationship status: Not on file  . Intimate partner violence:    Fear of current or ex partner: Not on file    Emotionally abused: Not on file    Physically abused: Not on file    Forced sexual activity: Not on file  Other Topics Concern  . Not on file  Social History Narrative  . Not on file    Cheryl Carlson family history includes Diabetes in her maternal grandmother and mother; Heart attack in her brother; Heart attack (age of onset: 41) in her brother; Heart disease in her mother; Hypertension in her father and mother.      Objective:    Vitals:   10/29/18 1040  BP: 90/60  Pulse: 80    Physical Exam; well-developed African-American female in no acute distress, pleasant, height 5 foot 5, weight 162, BMI 27.0.  HEENT nontraumatic normocephalic EOMI PERRLA sclera anicteric oral mucosa moist, Cardiovascular regular rate and rhythm with S1-S2 no murmur rub or gallop, Pulmonary clear bilaterally, Abdomen soft, nontender nondistended bowel sounds are active no palpable mass or hepatosplenomegaly.  Rectal exam not done, Extremities no clubbing cyanosis or edema skin warm and dry, Neuropsych alert and oriented, grossly nonfocal mood and affect appropriate       Assessment & Plan:   #1 48 year old African-American female with frequent solid food dysphasia over the past 3 months, as well as frequent daytime heartburn and indigestion and intermittent nocturnal wakening with coughing and "choking".  I suspect she has underlying chronic GERD, rule out associated esophageal stricture, rule out Schatzki's ring, rule out motility disorder, rule out less likely malignancy  #2 Colon cancer screening-patient is asymptomatic and of average risk.  We will plan screening colonoscopy later this year after she turns 48 turns likely will not cover prior to 48.  #3 small-volume hemoptysis-intermittent by patient  report.  Work-up will be deferred to her PCP.  I asked her to make a follow-up appointment with Dr. Kris Mouton.  Plan; Start omeprazole 40 mg p.o. every morning, prescription sent Will be scheduled for upper endoscopy with possible esophageal dilation with Dr. Carlean Purl.  Procedure was discussed in detail with the patient including indications risks and benefits and she is agreeable to proceed. I have asked her to call back in July or August 2022 schedule for screening colonoscopy with Dr. Carlean Purl. Patient was advised to see her PCP in follow-up for small-volume hemoptysis.   S  PA-C 10/29/2018   Cc: Cheryl Reasons, MD  Agree with Cheryl Carlson assessment and plan. Gatha Mayer, MD, [facility]

## 2018-10-29 NOTE — Patient Instructions (Signed)
If you are age 50 or older, your body mass index should be between 23-30. Your Body mass index is 27.02 kg/m. If this is out of the aforementioned range listed, please consider follow up with your Primary Care Provider.  If you are age 32 or younger, your body mass index should be between 19-25. Your Body mass index is 27.02 kg/m. If this is out of the aformentioned range listed, please consider follow up with your Primary Care Provider.   You have been scheduled for an endoscopy. Please follow written instructions given to you at your visit today. If you use inhalers (even only as needed), please bring them with you on the day of your procedure. Your physician has requested that you go to www.startemmi.com and enter the access code given to you at your visit today. This web site gives a general overview about your procedure. However, you should still follow specific instructions given to you by our office regarding your preparation for the procedure.  We have sent the following medications to your pharmacy for you to pick up at your convenience: Omeprazole  Call back in July/August for a screening colonoscopy.  Thank you for choosing me and [facility].   Amy Esterwood, PA-C

## 2018-11-05 ENCOUNTER — Ambulatory Visit (AMBULATORY_SURGERY_CENTER): Payer: Self-pay | Admitting: Internal Medicine

## 2018-11-05 ENCOUNTER — Encounter: Payer: Self-pay | Admitting: Internal Medicine

## 2018-11-05 VITALS — BP 126/89 | HR 79 | Temp 97.7°F | Resp 15 | Ht 65.0 in | Wt 162.0 lb

## 2018-11-05 DIAGNOSIS — R1319 Other dysphagia: Secondary | ICD-10-CM

## 2018-11-05 DIAGNOSIS — K219 Gastro-esophageal reflux disease without esophagitis: Secondary | ICD-10-CM

## 2018-11-05 DIAGNOSIS — R1310 Dysphagia, unspecified: Secondary | ICD-10-CM

## 2018-11-05 MED ORDER — SODIUM CHLORIDE 0.9 % IV SOLN: 500.0000 mL | Freq: Once | INTRAVENOUS | Status: DC

## 2018-11-05 MED ORDER — OMEPRAZOLE 40 MG PO CPDR: 40.0000 mg | DELAYED_RELEASE_CAPSULE | ORAL | 11 refills | Status: DC

## 2018-11-05 NOTE — Patient Instructions (Addendum)
I dilated the esophagus - I hope that helps you swallow better.  I think you should take the omeprazole every day and follow a GERD diet. I did not see any damage from acid reflux but it sure souls like that is what is going on.  If you do not have satisfactory resolution of your problems after 1- 2 months then schedule a follow-up visit.   Follow an antireflux regimen.  This includes:      - Do not lie down for at least 3 to 4 hours after meals.       - Raise the head of the bed 4 to 6 inches.       - Decrease excess weight.       - Avoid citrus juices and other acidic foods, alcohol, chocolate, mints, coffee and other caffeinated beverages, carbonated beverages, fatty and fried foods.       - Avoid tight-fitting clothing.       - Avoid cigarettes and other tobacco products.       I appreciate the opportunity to care for you. Gatha Mayer, MD, FACG   YOU HAD AN ENDOSCOPIC PROCEDURE TODAY AT [facility]:   Refer to the procedure report that was given to you for any specific questions about what was found during the examination.  If the procedure report does not answer your questions, please call your gastroenterologist to clarify.  If you requested that your care partner not be given the details of your procedure findings, then the procedure report has been included in a sealed envelope for you to review at your convenience later.  YOU SHOULD EXPECT: Some feelings of bloating in the abdomen. Passage of more gas than usual.  Walking can help get rid of the air that was put into your GI tract during the procedure and reduce the bloating. If you had a lower endoscopy (such as a colonoscopy or flexible sigmoidoscopy) you may notice spotting of blood in your stool or on the toilet paper. If you underwent a bowel prep for your procedure, you may not have a normal bowel movement for a few days.  Please Note:  You might notice some irritation and congestion in your  nose or some drainage.  This is from the oxygen used during your procedure.  There is no need for concern and it should clear up in a day or so.  SYMPTOMS TO REPORT IMMEDIATELY:   Following lower endoscopy (colonoscopy or flexible sigmoidoscopy):  Excessive amounts of blood in the stool  Significant tenderness or worsening of abdominal pains  Swelling of the abdomen that is new, acute  Fever of 100F or higher   Following upper endoscopy (EGD)  Vomiting of blood or coffee ground material  New chest pain or pain under the shoulder blades  Painful or persistently difficult swallowing  New shortness of breath  Fever of 100F or higher  Black, tarry-looking stools  For urgent or emergent issues, a gastroenterologist can be reached at any hour by calling [phone].   DIET:  Please follow the dilatation diet the rest of today.  Handout was given to your care partner.  Drink plenty of fluids but you should avoid alcoholic beverages for 24 hours.  ACTIVITY:  You should plan to take it easy for the rest of today and you should NOT DRIVE or use heavy machinery until tomorrow (because of the sedation medicines used during the test).    FOLLOW UP: Our staff will  call the number listed on your records the next business day following your procedure to check on you and address any questions or concerns that you may have regarding the information given to you following your procedure. If we do not reach you, we will leave a message.  However, if you are feeling well and you are not experiencing any problems, there is no need to return our call.  We will assume that you have returned to your regular daily activities without incident.  If any biopsies were taken you will be contacted by phone or by letter within the next 1-3 weeks.  Please call us at [phone] if you have not heard about the biopsies in 3 weeks.    SIGNATURES/CONFIDENTIALITY: You and/or your care partner have signed  paperwork which will be entered into your electronic medical record.  These signatures attest to the fact that that the information above on your After Visit Summary has been reviewed and is understood.  Full responsibility of the confidentiality of this discharge information lies with you and/or your care-partner.    Handouts were given to your care partner on Acid reflux and the dilatation diet to follow the rest of today.      You may resume your current medications today. If symptoms are not better 1-2 months call to office for an appointment. Please call if any questions or concerns.

## 2018-11-05 NOTE — Progress Notes (Signed)
No problems noted in the recovery room. maw 

## 2018-11-05 NOTE — Progress Notes (Signed)
Pt's states no medical or surgical changes since previsit or office visit. 

## 2018-11-05 NOTE — Progress Notes (Signed)
Called to room to assist during endoscopic procedure.  Patient ID and intended procedure confirmed with present staff. Received instructions for my participation in the procedure from the performing physician.  

## 2018-11-05 NOTE — Op Note (Signed)
[facility] Patient Name: Cheryl Carlson Procedure Date: 11/05/2018 10:30 AM MRN: [id number] Endoscopist: Gatha Mayer , MD Age: 48 Referring MD:  Date of Birth: Dec 05, 1970 Gender: Female Account #: [id number] Procedure:                Upper GI endoscopy Indications:              Dysphagia, Suspected esophageal reflux Medicines:                Propofol per Anesthesia, Monitored Anesthesia Care Procedure:                Pre-Anesthesia Assessment:                           - Prior to the procedure, a History and Physical                            was performed, and patient medications and                            allergies were reviewed. The patient's tolerance of                            previous anesthesia was also reviewed. The risks                            and benefits of the procedure and the sedation                            options and risks were discussed with the patient.                            All questions were answered, and informed consent                            was obtained. Prior Anticoagulants: The patient has                            taken no previous anticoagulant or antiplatelet                            agents. ASA Grade Assessment: II - A patient with                            mild systemic disease. After reviewing the risks                            and benefits, the patient was deemed in                            satisfactory condition to undergo the procedure.                           After obtaining informed consent, the endoscope was  passed under direct vision. Throughout the                            procedure, the patient's blood pressure, pulse, and                            oxygen saturations were monitored continuously. The                            Endoscope was introduced through the mouth, and                            advanced to the second part of duodenum. The upper           GI endoscopy was accomplished without difficulty.                            The patient tolerated the procedure well. Scope In: Scope Out: Findings:                 The examined esophagus was mildly tortuous.                           The exam was otherwise without abnormality.                           The cardia and gastric fundus were normal on                            retroflexion.                           The scope was withdrawn. Dilation was performed in                            the entire esophagus with a Maloney dilator with                            mild resistance at 54 Fr. Estimated blood loss was                            minimal. I did see a slight mucosal disruption in                            the upper esophagus. Complications:            No immediate complications. Estimated Blood Loss:     Estimated blood loss was minimal. Impression:               - Tortuous esophagus.                           - The examination was otherwise normal.                           - Dilation performed in the entire esophagus.  Slight mucosal disruption in upper esophagus.                           - No specimens collected. Recommendation:           - Patient has a contact number available for                            emergencies. The signs and symptoms of potential                            delayed complications were discussed with the                            patient. Return to normal activities tomorrow.                            Written discharge instructions were provided to the                            patient.                           - Clear liquids x 1 hour then soft foods rest of                            day. Start prior diet tomorrow.                           - Continue present medications.                           - Follow an antireflux regimen.                           If not better 1-2 mos return to [place] Gatha Mayer,  MD 11/05/2018 11:01:50 AM This report has been signed electronically.

## 2018-11-05 NOTE — Progress Notes (Signed)
Report to PACU, RN, vss, BBS= Clear.  

## 2018-11-06 ENCOUNTER — Telehealth: Payer: Self-pay | Admitting: *Deleted

## 2018-11-06 NOTE — Telephone Encounter (Signed)
  Follow up Call-  Call back number 11/05/2018  Post procedure Call Back phone  # [phone]  Permission to leave phone message Yes  Some recent data might be hidden     Patient questions:  Do you have a fever, pain , or abdominal swelling? No. Pain Score  0 *  Have you tolerated food without any problems? Yes.    Have you been able to return to your normal activities? No.  Still very sleepy today, has decided to leave work and take one more day before returning to normal activities.  Do you have any questions about your discharge instructions: Diet   No. Medications  No. Follow up visit  No.  Do you have questions or concerns about your Care? No.  Actions: * If pain score is 4 or above: No action needed, pain <4.

## 2019-02-16 ENCOUNTER — Other Ambulatory Visit: Payer: Self-pay | Admitting: Family Medicine

## 2019-02-16 DIAGNOSIS — Z20822 Contact with and (suspected) exposure to covid-19: Secondary | ICD-10-CM

## 2019-02-18 LAB — NOVEL CORONAVIRUS, NAA: SARS-CoV-2, NAA: NOT DETECTED

## 2019-03-06 ENCOUNTER — Emergency Department (HOSPITAL_COMMUNITY)
Admission: EM | Admit: 2019-03-06 | Discharge: 2019-03-06 | Disposition: A | Discharge status: Home / Self Care | Payer: PRIVATE HEALTH INSURANCE | Attending: Emergency Medicine | Admitting: Emergency Medicine

## 2019-03-06 ENCOUNTER — Encounter (HOSPITAL_COMMUNITY): Payer: Self-pay

## 2019-03-06 ENCOUNTER — Other Ambulatory Visit: Payer: Self-pay

## 2019-03-06 DIAGNOSIS — Z7982 Long term (current) use of aspirin: Secondary | ICD-10-CM | POA: Insufficient documentation

## 2019-03-06 DIAGNOSIS — F17210 Nicotine dependence, cigarettes, uncomplicated: Secondary | ICD-10-CM | POA: Diagnosis not present

## 2019-03-06 DIAGNOSIS — K047 Periapical abscess without sinus: Secondary | ICD-10-CM

## 2019-03-06 DIAGNOSIS — Z79899 Other long term (current) drug therapy: Secondary | ICD-10-CM | POA: Insufficient documentation

## 2019-03-06 DIAGNOSIS — I10 Essential (primary) hypertension: Secondary | ICD-10-CM | POA: Diagnosis not present

## 2019-03-06 DIAGNOSIS — K0889 Other specified disorders of teeth and supporting structures: Secondary | ICD-10-CM | POA: Diagnosis present

## 2019-03-06 MED ORDER — HYDROCODONE-ACETAMINOPHEN 5-325 MG PO TABS: 1.0000 | ORAL_TABLET | ORAL | 0 refills | Status: DC | PRN

## 2019-03-06 MED ORDER — CLINDAMYCIN HCL 150 MG PO CAPS: 300.0000 mg | ORAL_CAPSULE | Freq: Four times a day (QID) | ORAL | 0 refills | Status: DC

## 2019-03-06 MED ORDER — IBUPROFEN 800 MG PO TABS
800.0000 mg | ORAL_TABLET | Freq: Once | ORAL | Status: AC
  Administered 2019-03-06: 800 mg via ORAL
  Filled 2019-03-06: qty 1

## 2019-03-06 MED ORDER — OXYCODONE-ACETAMINOPHEN 5-325 MG PO TABS
1.0000 | ORAL_TABLET | Freq: Once | ORAL | Status: AC
  Administered 2019-03-06: 1 via ORAL
  Filled 2019-03-06: qty 1

## 2019-03-06 MED ORDER — CLINDAMYCIN PHOSPHATE 600 MG/4ML IJ SOLN
600.0000 mg | Freq: Once | INTRAMUSCULAR | Status: AC
  Administered 2019-03-06: 600 mg via INTRAMUSCULAR
  Filled 2019-03-06: qty 4

## 2019-03-06 MED ORDER — IBUPROFEN 800 MG PO TABS: 800.0000 mg | ORAL_TABLET | Freq: Four times a day (QID) | ORAL | 0 refills | Status: DC | PRN

## 2019-03-06 NOTE — ED Provider Notes (Signed)
[facility] DEPT Provider Note   CSN: [id number] Arrival date & time: 03/06/19  0335    History   Chief Complaint Chief Complaint  Patient presents with  . Dental Pain    HPI Cheryl Carlson is a 48 y.o. female.     Patient presents to the emergency department for evaluation of dental pain.  Patient reports that she had a tooth break on the right upper side of her mouth 2 days ago and has been having pain since.  Pain has progressively worsened.  It is constant and severe.  Pain radiates to the right ear and throat area.  She has now noticed some swelling of the face.     Past Medical History:  Diagnosis Date  . Hypertension   . Mitral valve prolapse     Patient Active Problem List   Diagnosis Date Noted  . Claudication ([place]) 04/19/2018  . Chest pain 04/19/2018  . Dysphagia 02/02/2017  . Hot flashes 02/02/2017  . Abnormal uterine bleeding (AUB) 01/07/2016  . 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 Intermenstrual bleeding 12/03/2015  . Night sweats 12/03/2015  . Essential hypertension, benign 09/22/2014  . GERD (gastroesophageal reflux disease) 09/22/2014  . Tobacco abuse 09/22/2014    Past Surgical History:  Procedure Laterality Date  . APPENDECTOMY    . uterine tumor removal  2011     OB History   No obstetric history on file.      Home Medications    Prior to Admission medications   Medication Sig Start Date End Date Taking? Authorizing Provider  amLODipine (NORVASC) 10 MG tablet Take 1 tablet (10 mg total) by mouth daily. 04/13/18   Guadalupe Dawn, MD  aspirin EC 81 MG tablet Take 81 mg by mouth daily.    [provider]  cholecalciferol (VITAMIN D) 1000 units tablet Take 1,000 Units by mouth daily.    [provider]  clindamycin (CLEOCIN) 150 MG capsule Take 2 capsules (300 mg total) by mouth 4 (four) times daily. 03/06/19   Orpah Greek, MD  HYDROcodone-acetaminophen (NORCO/VICODIN) 5-325 MG tablet Take  1 tablet by mouth every 4 (four) hours as needed for moderate pain. 03/06/19   Orpah Greek, MD  ibuprofen (ADVIL) 800 MG tablet Take 1 tablet (800 mg total) by mouth every 6 (six) hours as needed for moderate pain. 03/06/19   Orpah Greek, MD  Multiple Vitamins-Minerals (HAIR/SKIN/NAILS) TABS Take 1 tablet by mouth daily.     [provider]  omeprazole (PRILOSEC) 40 MG capsule Take 1 capsule (40 mg total) by mouth every morning. Take 30 minutes before breakfast 11/05/18   Gatha Mayer, MD  vitamin C (ASCORBIC ACID) 500 MG tablet Take 1,000 mg by mouth daily.    [provider]    Family History Family History  Problem Relation Age of Onset  . Heart disease Mother   . Hypertension Mother   . Diabetes Mother   . Hypertension Father   . Heart attack Brother 105  . Diabetes Maternal Grandmother   . Heart attack Brother   . Stomach cancer Neg Hx   . Esophageal cancer Neg Hx     Social History Social History   Tobacco Use  . Smoking status: Current Every Day Smoker    Packs/day: 0.50    Types: Cigarettes  . Smokeless tobacco: Never Used  Substance Use Topics  . Alcohol use: Yes    Comment: socially  . Drug use: No  Allergies   Patient has no known allergies.   Review of Systems Review of Systems  HENT: Positive for dental problem.   All other systems reviewed and are negative.    Physical Exam Updated Vital Signs BP (!) 148/116 (BP Location: Right Arm)   Pulse (!) 102   Temp 98.7 F (37.1 C) (Oral)   Resp 16   SpO2 99%   Physical Exam Vitals signs and nursing note reviewed.  Constitutional:      General: She is not in acute distress.    Appearance: Normal appearance. She is well-developed.  HENT:     Head: Normocephalic and atraumatic.     Comments: Right facial swelling    Right Ear: Hearing normal.     Left Ear: Hearing normal.     Nose: Nose normal.     Mouth/Throat:     Dentition: Dental tenderness and dental  caries present.   Eyes:     Conjunctiva/sclera: Conjunctivae normal.     Pupils: Pupils are equal, round, and reactive to light.  Neck:     Musculoskeletal: Normal range of motion and neck supple.  Cardiovascular:     Rate and Rhythm: Regular rhythm.     Heart sounds: S1 normal and S2 normal. No murmur. No friction rub. No gallop.   Pulmonary:     Effort: Pulmonary effort is normal. No respiratory distress.     Breath sounds: Normal breath sounds.  Chest:     Chest wall: No tenderness.  Abdominal:     General: Bowel sounds are normal.     Palpations: Abdomen is soft.     Tenderness: There is no abdominal tenderness. There is no guarding or rebound. Negative signs include Murphy's sign and McBurney's sign.     Hernia: No hernia is present.  Musculoskeletal: Normal range of motion.  Skin:    General: Skin is warm and dry.     Findings: No rash.  Neurological:     Mental Status: She is alert and oriented to person, place, and time.     GCS: GCS eye subscore is 4. GCS verbal subscore is 5. GCS motor subscore is 6.     Cranial Nerves: No cranial nerve deficit.     Sensory: No sensory deficit.     Coordination: Coordination normal.  Psychiatric:        Speech: Speech normal.        Behavior: Behavior normal.        Thought Content: Thought content normal.      ED Treatments / Results  Labs (all labs ordered are listed, but only abnormal results are displayed) Labs Reviewed - No data to display  EKG None  Radiology No results found.  Procedures Procedures (including critical care time)  Medications Ordered in ED Medications  clindamycin (CLEOCIN) injection 600 mg (has no administration in time range)  oxyCODONE-acetaminophen (PERCOCET/ROXICET) 5-325 MG per tablet 1 tablet (has no administration in time range)  ibuprofen (ADVIL) tablet 800 mg (has no administration in time range)     Initial Impression / Assessment and Plan / ED Course  I have reviewed the triage  vital signs and the nursing notes.  Pertinent labs & imaging results that were available during my care of the patient were reviewed by me and considered in my medical decision making (see chart for details).        Patient presents with right facial swelling and pain secondary to dental abscess.  There is no fluid collections  that can be drained currently.  Patient does not appear ill, afebrile.  No overlying cellulitis.  Final Clinical Impressions(s) / ED Diagnoses   Final diagnoses:  Dental abscess    ED Discharge Orders         Ordered    HYDROcodone-acetaminophen (NORCO/VICODIN) 5-325 MG tablet  Every 4 hours PRN     03/06/19 0432    ibuprofen (ADVIL) 800 MG tablet  Every 6 hours PRN     03/06/19 0432    clindamycin (CLEOCIN) 150 MG capsule  4 times daily     03/06/19 0432           Orpah Greek, MD 03/06/19 [phone]

## 2019-03-06 NOTE — ED Triage Notes (Signed)
Pt arrived with complaints of dental pain that started two days ago. Pt has pain now in tooth, jaw, ear, and throat. Right sided facial swelling noted.

## 2019-04-16 ENCOUNTER — Other Ambulatory Visit: Payer: Self-pay

## 2019-04-16 ENCOUNTER — Ambulatory Visit (INDEPENDENT_AMBULATORY_CARE_PROVIDER_SITE_OTHER): Payer: No Typology Code available for payment source | Admitting: Family Medicine

## 2019-04-16 ENCOUNTER — Encounter: Payer: Self-pay | Admitting: Family Medicine

## 2019-04-16 ENCOUNTER — Other Ambulatory Visit: Payer: Self-pay | Admitting: *Deleted

## 2019-04-16 VITALS — BP 124/76 | HR 108

## 2019-04-16 DIAGNOSIS — M545 Low back pain, unspecified: Secondary | ICD-10-CM

## 2019-04-16 MED ORDER — AMLODIPINE BESYLATE 10 MG PO TABS: 10.0000 mg | ORAL_TABLET | Freq: Every day | ORAL | 3 refills | Status: DC

## 2019-04-16 NOTE — Progress Notes (Signed)
   Subjective:    Cheryl Carlson - 48 y.o. female MRN [id number]  Date of birth: [birth year]  CC:  Cheryl Carlson is here for left gluteal and lower back pain.  HPI: Left gluteal and lower back pain Cheryl Carlson reports that she was in the emergency department and received a clindamycin shot for a dental abscess in her left gluteal region, and since then, she has had numbness locally where the injection was placed as well as pain in her left buttock radiating to her lower back.  She is concerned because this pain has not resolved or improved since the injection on 03/06/2019.  She has no radiation of numbness or pain down either thigh.  This pain has made standing up from a seated position and bending down difficult and painful.  She has tried Tylenol and ibuprofen to relieve her pain but has only experienced minimal relief.  She denies saddle anesthesia and bladder or bowel incontinence.  She denies fever, chills, or weight loss.  She has not noticed any redness or swelling around the injection site.  Health Maintenance:  Health Maintenance Due  Topic Date Due  . PAP SMEAR-Modifier  11/30/2018  . MAMMOGRAM  03/29/2019  . COLONOSCOPY  03/29/2019  . INFLUENZA VACCINE  04/13/2019    -  reports that she has been smoking cigarettes. She has been smoking about 0.50 packs per day. She has never used smokeless tobacco. - Review of Systems: Per HPI. - Past Medical History: Patient Active Problem List   Diagnosis Date Noted  . Acute left-sided low back pain without sciatica 04/17/2019  . Claudication ([place]) 04/19/2018  . Chest pain 04/19/2018  . Dysphagia 02/02/2017  . Hot flashes 02/02/2017  . Abnormal uterine bleeding (AUB) 01/07/2016  . 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 Intermenstrual bleeding 12/03/2015  . Night sweats 12/03/2015  . Essential hypertension, benign 09/22/2014  . GERD (gastroesophageal reflux disease) 09/22/2014  . Tobacco abuse 09/22/2014   - Medications: reviewed and updated    Objective:   Physical Exam BP 124/76   Pulse (!) 108   SpO2 99%  Gen: NAD, alert, cooperative with exam, well-appearing CV: RRR, good S1/S2, no murmur, no edema Resp: CTABL, no wheezes, non-labored Musculoskeletal: No visual abnormality of the buttocks or lower back, including erythema, ecchymosis, or swelling.  Nontender to palpation along the lumbar spine.  Tender to palpation along the central area of the left buttock.  Small area of induration palpated in the central left buttock.  Standing from a seated position is slow and painful, and lumbar flexion is also slow and painful.  Neurovascularly intact.  Normal gait. Psych: good insight, alert and oriented    Assessment & Plan:   Acute left-sided low back pain without sciatica There are no red flag symptoms requiring emergent imaging, such as saddle anesthesia, bowel or bladder incontinence, or numbness radiating to either thigh.  However, it is possible that her previous injection may have caused some inflammation in the area that is contributing to patient's symptoms.  Will obtain soft tissue ultrasound of the affected area for better visualization.  If this ultrasound is reassuring, will advise physical therapy to help patient regain range of motion and to improve patient's pain.  Patient was agreeable to this plan.  Ultrasound was ordered today.    Maia Breslow, M.D. 04/17/2019, 10:11 AM PGY-3, [place]

## 2019-04-16 NOTE — Patient Instructions (Signed)
It was nice meeting you today Cheryl Carlson!  We are ordering an ultrasound of the area that was injected to investigate whether you have any swelling or hematoma in that area.  If the ultrasound does not show much, we will try physical therapy to improve your pain.  Physical therapy is one of the best ways to improve low back pain and will likely be very effective for you.  Please let me know if you develop numbness down either of your legs or bowel or bladder incontinence.  I hope you feel better soon.  If you have any questions or concerns, please feel free to call the clinic.   Be well,  Dr. Shan Levans

## 2019-04-17 DIAGNOSIS — M545 Low back pain, unspecified: Secondary | ICD-10-CM | POA: Insufficient documentation

## 2019-04-17 NOTE — Assessment & Plan Note (Signed)
There are no red flag symptoms requiring emergent imaging, such as saddle anesthesia, bowel or bladder incontinence, or numbness radiating to either thigh.  However, it is possible that her previous injection may have caused some inflammation in the area that is contributing to patient's symptoms.  Will obtain soft tissue ultrasound of the affected area for better visualization.  If this ultrasound is reassuring, will advise physical therapy to help patient regain range of motion and to improve patient's pain.  Patient was agreeable to this plan.  Ultrasound was ordered today.

## 2019-06-04 ENCOUNTER — Telehealth: Payer: Self-pay | Admitting: *Deleted

## 2019-06-04 ENCOUNTER — Other Ambulatory Visit: Payer: Self-pay

## 2019-06-04 ENCOUNTER — Emergency Department (HOSPITAL_COMMUNITY)
Admission: EM | Admit: 2019-06-04 | Discharge: 2019-06-04 | Disposition: A | Discharge status: Home / Self Care | Payer: PRIVATE HEALTH INSURANCE | Attending: Emergency Medicine | Admitting: Emergency Medicine

## 2019-06-04 ENCOUNTER — Encounter (HOSPITAL_COMMUNITY): Payer: Self-pay

## 2019-06-04 ENCOUNTER — Emergency Department (HOSPITAL_COMMUNITY): Payer: PRIVATE HEALTH INSURANCE

## 2019-06-04 DIAGNOSIS — R079 Chest pain, unspecified: Secondary | ICD-10-CM

## 2019-06-04 DIAGNOSIS — Z20828 Contact with and (suspected) exposure to other viral communicable diseases: Secondary | ICD-10-CM | POA: Insufficient documentation

## 2019-06-04 DIAGNOSIS — R0602 Shortness of breath: Secondary | ICD-10-CM | POA: Insufficient documentation

## 2019-06-04 DIAGNOSIS — F17210 Nicotine dependence, cigarettes, uncomplicated: Secondary | ICD-10-CM | POA: Insufficient documentation

## 2019-06-04 DIAGNOSIS — Z79899 Other long term (current) drug therapy: Secondary | ICD-10-CM | POA: Insufficient documentation

## 2019-06-04 DIAGNOSIS — I10 Essential (primary) hypertension: Secondary | ICD-10-CM | POA: Insufficient documentation

## 2019-06-04 DIAGNOSIS — R0789 Other chest pain: Secondary | ICD-10-CM | POA: Insufficient documentation

## 2019-06-04 DIAGNOSIS — Z7982 Long term (current) use of aspirin: Secondary | ICD-10-CM | POA: Insufficient documentation

## 2019-06-04 DIAGNOSIS — R110 Nausea: Secondary | ICD-10-CM | POA: Insufficient documentation

## 2019-06-04 LAB — CBC
HCT: 41.3 % (ref 36.0–46.0)
Hemoglobin: 13.3 g/dL (ref 12.0–15.0)
MCH: 28.4 pg (ref 26.0–34.0)
MCHC: 32.2 g/dL (ref 30.0–36.0)
MCV: 88.2 fL (ref 80.0–100.0)
Platelets: 409 10*3/uL — ABNORMAL HIGH (ref 150–400)
RBC: 4.68 MIL/uL (ref 3.87–5.11)
RDW: 15.9 % — ABNORMAL HIGH (ref 11.5–15.5)
WBC: 9.2 10*3/uL (ref 4.0–10.5)
nRBC: 0 % (ref 0.0–0.2)

## 2019-06-04 LAB — BASIC METABOLIC PANEL
Anion gap: 10 (ref 5–15)
BUN: 13 mg/dL (ref 6–20)
CO2: 26 mmol/L (ref 22–32)
Calcium: 9.4 mg/dL (ref 8.9–10.3)
Chloride: 104 mmol/L (ref 98–111)
Creatinine, Ser: 0.77 mg/dL (ref 0.44–1.00)
GFR calc Af Amer: >60 mL/min (ref >60)
GFR calc non Af Amer: >60 mL/min (ref >60)
Glucose, Bld: 106 mg/dL — ABNORMAL HIGH (ref 70–99)
Potassium: 3.7 mmol/L (ref 3.5–5.1)
Sodium: 140 mmol/L (ref 135–145)

## 2019-06-04 LAB — TROPONIN I (HIGH SENSITIVITY)
Troponin I (High Sensitivity): <2 ng/L (ref <18)
Troponin I (High Sensitivity): 2 ng/L (ref <18)

## 2019-06-04 LAB — I-STAT BETA HCG BLOOD, ED (MC, WL, AP ONLY): I-stat hCG, quantitative: <5 m[IU]/mL (ref <5)

## 2019-06-04 LAB — D-DIMER, QUANTITATIVE: D-Dimer, Quant: 0.33 ug/mL-FEU (ref 0.00–0.50)

## 2019-06-04 MED ORDER — SODIUM CHLORIDE 0.9% FLUSH: 3.0000 mL | Freq: Once | INTRAVENOUS | Status: DC

## 2019-06-04 MED ORDER — ASPIRIN 81 MG PO CHEW
324.0000 mg | CHEWABLE_TABLET | Freq: Once | ORAL | Status: AC
  Administered 2019-06-04: 12:00:00 324 mg via ORAL
  Filled 2019-06-04: qty 4

## 2019-06-04 MED ORDER — NITROGLYCERIN 0.4 MG SL SUBL
0.4000 mg | SUBLINGUAL_TABLET | SUBLINGUAL | Status: DC | PRN
  Administered 2019-06-04: 12:00:00 0.4 mg via SUBLINGUAL
  Filled 2019-06-04: qty 1

## 2019-06-04 NOTE — ED Notes (Signed)
Pt ambulatory from waiting area to room.

## 2019-06-04 NOTE — ED Triage Notes (Signed)
Pt c/o chest pain since yesterday . Pt has hx of mitral valve prolapse. Pt states that she is due to see her cardiologist next month. Pt states she feels like her oxygen is low as well.

## 2019-06-04 NOTE — ED Provider Notes (Signed)
[facility] DEPT Provider Note   CSN: [id number] Arrival date & time: 06/04/19  [id number]     History   Chief Complaint Chief Complaint  Patient presents with  . Chest Pain    HPI Cheryl Carlson is a 48 y.o. Carlson.     HPI   48yo Carlson presents with concern for chest pain.  Yesterday began to have chest pain, initially thought maybe acid reflux. Felt like foot or elbow digging into chest, like pressure.  Right sided pain radiating to the left and left arm.  Feeling associated dyspnea.  Slight nausea but not sure if menopause related or not, diaphoresis from menopause.  CP right now 5/10 while relaxed. If turn on side feels fluttering.  Not worse with exertion.  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  Coming and going since yesterday.  Dry cough, has been going on for a long time, not changed, sleeps with fan on due to menopause.    Friends have died from MI.  Brother with heart problems under investigation, 50yo, on nitro afib.  Diagnosed with MVP 10 years ago personally.  Mom also has heart disease, unclear if CAD, was diagnosed in her 89s.   Htn, smoking No hx of hlpd, dm Not on estrogen, no leg pain or swelling, no long trips or recent surgeries.   Past Medical History:  Diagnosis Date  . Hypertension   . Mitral valve prolapse     Patient Active Problem List   Diagnosis Date Noted  . Acute left-sided low back pain without sciatica 04/17/2019  . Claudication ([place]) 04/19/2018  . Chest pain 04/19/2018  . Dysphagia 02/02/2017  . Hot flashes 02/02/2017  . Abnormal uterine bleeding (AUB) 01/07/2016  . 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 Intermenstrual bleeding 12/03/2015  . Night sweats 12/03/2015  . Essential hypertension, benign 09/22/2014  . GERD (gastroesophageal reflux disease) 09/22/2014  . Tobacco abuse 09/22/2014    Past Surgical History:  Procedure Laterality Date  . APPENDECTOMY    . uterine tumor removal  2011     OB History   No  obstetric history on file.      Home Medications    Prior to Admission medications   Medication Sig Start Date End Date Taking? Authorizing Provider  amLODipine (NORVASC) 10 MG tablet Take 1 tablet (10 mg total) by mouth daily. 04/16/19  Yes Guadalupe Dawn, MD  aspirin EC 81 MG tablet Take 81 mg by mouth daily.   Yes [provider]  Multiple Vitamins-Minerals (HAIR/SKIN/NAILS) TABS Take 1 tablet by mouth daily.    Yes [provider]  omeprazole (PRILOSEC) 40 MG capsule Take 1 capsule (40 mg total) by mouth every morning. Take 30 minutes before breakfast 11/05/18  Yes Gatha Mayer, MD  clindamycin (CLEOCIN) 150 MG capsule Take 2 capsules (300 mg total) by mouth 4 (four) times daily. Patient not taking: Reported on 06/04/2019 03/06/19   Orpah Greek, MD  HYDROcodone-acetaminophen (NORCO/VICODIN) 5-325 MG tablet Take 1 tablet by mouth every 4 (four) hours as needed for moderate pain. Patient not taking: Reported on 06/04/2019 03/06/19   Orpah Greek, MD  ibuprofen (ADVIL) 800 MG tablet Take 1 tablet (800 mg total) by mouth every 6 (six) hours as needed for moderate pain. Patient not taking: Reported on 06/04/2019 03/06/19   Orpah Greek, MD    Family History Family History  Problem Relation Age of Onset  . Heart disease Mother   . Hypertension Mother   .  Diabetes Mother   . Hypertension Father   . Heart attack Brother 69  . Diabetes Maternal Grandmother   . Heart attack Brother   . Stomach cancer Neg Hx   . Esophageal cancer Neg Hx     Social History Social History   Tobacco Use  . Smoking status: Current Every Day Smoker    Packs/day: 0.50    Types: Cigarettes  . Smokeless tobacco: Never Used  Substance Use Topics  . Alcohol use: Yes    Comment: socially  . Drug use: No     Allergies   Patient has no known allergies.   Review of Systems Review of Systems  Constitutional: Negative for fever.  HENT: Negative for sore  throat.   Eyes: Negative for visual disturbance.  Respiratory: Positive for shortness of breath. Negative for cough.   Cardiovascular: Positive for chest pain.  Gastrointestinal: Positive for nausea. Negative for abdominal pain and vomiting.  Genitourinary: Negative for difficulty urinating and dysuria.  Musculoskeletal: Negative for back pain and neck pain.  Skin: Negative for rash.  Neurological: Negative for syncope and headaches.     Physical Exam Updated Vital Signs BP (!) 146/94   Pulse 81   Temp 98.1 F (36.7 C) (Oral)   Resp 20   LMP 04/22/2016   SpO2 100%   Physical Exam Vitals signs and nursing note reviewed.  Constitutional:      General: Cheryl Carlson is not in acute distress.    Appearance: Cheryl Carlson is well-developed. Cheryl Carlson is not diaphoretic.  HENT:     Head: Normocephalic and atraumatic.  Eyes:     Conjunctiva/sclera: Conjunctivae normal.  Neck:     Musculoskeletal: Normal range of motion.  Cardiovascular:     Rate and Rhythm: Normal rate and regular rhythm.     Heart sounds: Normal heart sounds. No murmur. No friction rub. No gallop.   Pulmonary:     Effort: Pulmonary effort is normal. No respiratory distress.     Breath sounds: Normal breath sounds. No wheezing or rales.  Abdominal:     General: There is no distension.     Palpations: Abdomen is soft.     Tenderness: There is no abdominal tenderness. There is no guarding.  Musculoskeletal:        General: No tenderness.  Skin:    General: Skin is warm and dry.     Findings: No erythema or rash.  Neurological:     Mental Status: Cheryl Carlson is alert and oriented to person, place, and time.      ED Treatments / Results  Labs (all labs ordered are listed, but only abnormal results are displayed) Labs Reviewed  BASIC METABOLIC PANEL - Abnormal; Notable for the following components:      Result Value   Glucose, Bld 106 (*)    All other components within normal limits  CBC - Abnormal; Notable for the following  components:   RDW 15.9 (*)    Platelets 409 (*)    All other components within normal limits  D-DIMER, QUANTITATIVE (NOT AT [facility])  I-STAT BETA HCG BLOOD, ED ([facility], [facility], [facility] ONLY)  TROPONIN I (HIGH SENSITIVITY)  TROPONIN I (HIGH SENSITIVITY)    EKG EKG Interpretation  Date/Time:  Tuesday June 04 2019 10:05:11 EDT Ventricular Rate:  87 PR Interval:  144 QRS Duration: 70 QT Interval:  382 QTC Calculation: 459 R Axis:   100 Text Interpretation:  Normal sinus rhythm Rightward axis Septal infarct , age undetermined Abnormal ECG No significant  change since last tracing Confirmed by Gareth Morgan [phone]) on 06/04/2019 10:46:11 AM   Radiology Dg Chest 2 View  Result Date: 06/04/2019 CLINICAL DATA:  Chest pain EXAM: CHEST - 2 VIEW COMPARISON:  04/04/2018 FINDINGS: The heart size and mediastinal contours are within normal limits. Both lungs are clear. The visualized skeletal structures are unremarkable. IMPRESSION: No active cardiopulmonary disease. Electronically Signed   By: Kathreen Devoid   On: 06/04/2019 11:52    Procedures Procedures (including critical care time)  Medications Ordered in ED Medications  aspirin chewable tablet 324 mg (has no administration in time range)  nitroGLYCERIN (NITROSTAT) SL tablet 0.4 mg (has no administration in time range)     Initial Impression / Assessment and Plan / ED Course  I have reviewed the triage vital signs and the nursing notes.  Pertinent labs & imaging results that were available during my care of the patient were reviewed by me and considered in my medical decision making (see chart for details).        Cheryl Carlson with a history of hypertension, smoking, mitral valve prolapse, presents with concern for chest pain.  EKG shows no acute findings.  Cheryl Carlson is low risk [place], with a negative d-dimer and have low suspicion for pulmonary embolus.  Chest x-ray shows no signs of pneumonia, pneumothorax, or other abnormalities.  Cheryl Carlson has  equal bilateral upper and lower extremity pulses, history and physical exam are not consistent with aortic dissection.  EKG is unchanged. Nonexertional chest pain, HEART score 3 (did not include early CAD as not clear diagnosis of CAD in mother or brother by history, while mom does have what sounds like VSD, brother with afib.)  Delta troponin negative.  Recommend outpatient follow up with PCP and Cardiology.    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   Cheryl Carlson was evaluated in Emergency Department on 06/04/2019 for the symptoms described in the history of present illness. Cheryl Carlson was evaluated in the context of the global COVID-19 pandemic, which necessitated consideration that the patient might be at risk for infection with the SARS-CoV-2 virus that causes COVID-19. Institutional protocols and algorithms that pertain to the evaluation of patients at risk for COVID-19 are in a state of rapid change based on information released by regulatory bodies including the CDC and federal and state organizations. These policies and algorithms were followed during the patient's care in the ED.   Final Clinical Impressions(s) / ED Diagnoses   Final diagnoses:  Nonspecific chest pain    ED Discharge Orders    None       Gareth Morgan, MD 06/04/19 2152

## 2019-06-04 NOTE — Telephone Encounter (Signed)
Pt calls because she is having a dull pain in her chest, arm pain and SOB.  Advised to go to ED.  Pt reluctant but agreeable.  Christen Bame, CMA

## 2019-06-04 NOTE — Discharge Instructions (Addendum)
We are testing you for COVID19 given high risk occupation and shortness of breath and recommend quarantine until results return.  You can check MyChart for results which should take about 24 hours.  Your second heart number (troponin) looks excellent. Please follow up with Cardiology (assuming you have negative COVID test) and return to ED for new or worsening symptoms.

## 2019-06-05 LAB — NOVEL CORONAVIRUS, NAA (HOSP ORDER, SEND-OUT TO REF LAB; TAT 18-24 HRS): SARS-CoV-2, NAA: NOT DETECTED

## 2019-06-11 ENCOUNTER — Ambulatory Visit (INDEPENDENT_AMBULATORY_CARE_PROVIDER_SITE_OTHER): Payer: PRIVATE HEALTH INSURANCE | Admitting: Cardiology

## 2019-06-11 ENCOUNTER — Other Ambulatory Visit: Payer: Self-pay

## 2019-06-11 ENCOUNTER — Encounter: Payer: Self-pay | Admitting: Cardiology

## 2019-06-11 VITALS — BP 123/86 | HR 79 | Ht 65.0 in | Wt 157.0 lb

## 2019-06-11 DIAGNOSIS — R071 Chest pain on breathing: Secondary | ICD-10-CM | POA: Diagnosis not present

## 2019-06-11 DIAGNOSIS — Z720 Tobacco use: Secondary | ICD-10-CM

## 2019-06-11 DIAGNOSIS — I10 Essential (primary) hypertension: Secondary | ICD-10-CM

## 2019-06-11 DIAGNOSIS — R0789 Other chest pain: Secondary | ICD-10-CM

## 2019-06-11 DIAGNOSIS — Z7189 Other specified counseling: Secondary | ICD-10-CM

## 2019-06-11 DIAGNOSIS — Z8249 Family history of ischemic heart disease and other diseases of the circulatory system: Secondary | ICD-10-CM | POA: Diagnosis not present

## 2019-06-11 NOTE — Progress Notes (Signed)
Cardiology Office Note:    Date:  06/11/2019   ID:  CAI FLOTT, DOB [birth year], MRN [id number]  PCP:  Guadalupe Dawn, MD  Cardiologist:  Buford Dresser, MD PhD  Referring MD: Guadalupe Dawn, MD   CC: chest pain  History of Present Illness:    Cheryl Carlson is a 48 y.o. female with a hx of HTN, MVP, GERD, tobacco abuse who is seen in follow up today. I initially met her 07/31/18.  Cardiac history/risk: She has significant family history of CAD; brother had MI and had ICD implanted at age 20.  Has had 2 week monitor placed for palpitations, treadmill exercise for chest pain, and echocardiogram for mitral valve prolapse. All testing was normal.  Today: Seen in the ER 06/04/19 with chest pain. Was at work at a college, was wearing a mask. Felt like she needed oxygen. Had a sharp pain in the left pectoral region, couldn't catch her breath, went to the ER. Also noted left arm pain. Er with negative D dimer, no acute changes in ECG. Delta troponin negative.   Pain continues, comes and goes but nearly constant. Now in the middle of her chest, intermittently tender to palpation.   Brother currently in the hospital with afib.   Still smoking, contemplative for change. Discussed this today.  ROS: Going through menopause, having hot flashes. Denies shortness of breath at rest or with normal exertion. No PND, orthopnea, LE edema or unexpected weight gain. No syncope or palpitations.   Past Medical History:  Diagnosis Date  . Hypertension   . Mitral valve prolapse     Past Surgical History:  Procedure Laterality Date  . APPENDECTOMY    . uterine tumor removal  2011    Current Medications: Current Outpatient Medications on File Prior to Visit  Medication Sig  . amLODipine (NORVASC) 10 MG tablet Take 1 tablet (10 mg total) by mouth daily.  Marland Kitchen aspirin EC 81 MG tablet Take 81 mg by mouth daily.  . clindamycin (CLEOCIN) 150 MG capsule Take 2 capsules (300 mg total) by  mouth 4 (four) times daily. (Patient not taking: Reported on 06/04/2019)  . HYDROcodone-acetaminophen (NORCO/VICODIN) 5-325 MG tablet Take 1 tablet by mouth every 4 (four) hours as needed for moderate pain. (Patient not taking: Reported on 06/04/2019)  . ibuprofen (ADVIL) 800 MG tablet Take 1 tablet (800 mg total) by mouth every 6 (six) hours as needed for moderate pain. (Patient not taking: Reported on 06/04/2019)  . Multiple Vitamins-Minerals (HAIR/SKIN/NAILS) TABS Take 1 tablet by mouth daily.   Marland Kitchen omeprazole (PRILOSEC) 40 MG capsule Take 1 capsule (40 mg total) by mouth every morning. Take 30 minutes before breakfast   No current facility-administered medications on file prior to visit.      Allergies:   Patient has no known allergies.   Social History   Socioeconomic History  . Marital status: Legally Separated    Spouse name: Not on file  . Number of children: 1  . Years of education: Not on file  . Highest education level: Not on file  Occupational History  . Occupation: Cusodial  Social Needs  . Financial resource strain: Not on file  . Food insecurity    Worry: Not on file    Inability: Not on file  . Transportation needs    Medical: Not on file    Non-medical: Not on file  Tobacco Use  . Smoking status: Current Every Day Smoker    Packs/day: 0.50  Types: Cigarettes  . Smokeless tobacco: Never Used  Substance and Sexual Activity  . Alcohol use: Yes    Comment: socially  . Drug use: No  . Sexual activity: Not on file  Lifestyle  . Physical activity    Days per week: Not on file    Minutes per session: Not on file  . Stress: Not on file  Relationships  . Social Herbalist on phone: Not on file    Gets together: Not on file    Attends religious service: Not on file    Active member of club or organization: Not on file    Attends meetings of clubs or organizations: Not on file    Relationship status: Not on file  Other Topics Concern  . Not on file   Social History Narrative  . Not on file     Family History: The patient's family history includes Diabetes in her maternal grandmother and mother; Heart attack in her brother; Heart attack (age of onset: 29) in her brother; Heart disease in her mother; Hypertension in her father and mother. There is no history of Stomach cancer or Esophageal cancer.  ROS:   Please see the history of present illness.  Additional pertinent ROS: Constitutional: Negative for chills, fever, night sweats, unintentional weight loss  HENT: Negative for ear pain and hearing loss.   Eyes: Negative for loss of vision and eye pain.  Respiratory: Negative for cough, sputum, wheezing.   Cardiovascular: See HPI. Gastrointestinal: Negative for abdominal pain, melena, and hematochezia.  Genitourinary: Negative for dysuria and hematuria.  Musculoskeletal: Negative for falls and myalgias.  Skin: Negative for itching and rash.  Neurological: Negative for focal weakness, focal sensory changes and loss of consciousness.  Endo/Heme/Allergies: Does not bruise/bleed easily.    EKGs/Labs/Other Studies Reviewed:    The following studies were reviewed today:  05/10/18 ETT  Blood pressure demonstrated a normal response to exercise.  There was no ST segment deviation noted during stress.   1. Good exercise tolerance.  2. No ischemic ST segment changes.   06/01/18 Monitor Monitor worn for 3 days 15 hours. No afib or significant arrhythmia detected. Less than 1% ectopic beats. Baseline is sinus rhythm at 66 bpm. 12 patient triggered events, all were sinus rhythm or sinus tachycardia. Of note, 20% of her recording was sinus tachycardia, remaining 80% was normal sinus rhythm.  05/03/18 Echo  Study Conclusions - Left ventricle: The cavity size was normal. Systolic function was   normal. The estimated ejection fraction was in the range of 60%   to 65%. Wall motion was normal; there were no regional wall   motion abnormalities.  Left ventricular diastolic function   parameters were normal. - Aortic valve: Transvalvular velocity was within the normal range.   There was no stenosis. There was no regurgitation. - Mitral valve: Transvalvular velocity was within the normal range.   There was no evidence for stenosis. There was no regurgitation. - Right ventricle: The cavity size was normal. Wall thickness was   normal. Systolic function was normal. - Atrial septum: No defect or patent foramen ovale was identified   by color flow Doppler. - Tricuspid valve: There was trivial regurgitation. - Pulmonary arteries: Systolic pressure was within the normal   range. PA peak pressure: 23 mm Hg (S).   EKG:  EKG is personally reviewed.  The ekg ordered 06/05/19 demonstrates normal sinus rhythm, lack of R waves in V1-V2  Recent Labs: 06/04/2019: BUN 13;  Creatinine, Ser 0.77; Hemoglobin 13.3; Platelets 409; Potassium 3.7; Sodium 140  Recent Lipid Panel    Component Value Date/Time   CHOL 170 11/30/2015 0904   TRIG 107 11/30/2015 0904   HDL 64 11/30/2015 0904   CHOLHDL 2.7 11/30/2015 0904   VLDL 21 11/30/2015 0904   LDLCALC 85 11/30/2015 0904    Physical Exam:    VS:  BP 123/86   Pulse 79   Ht '5\' 5"'  (1.651 m)   Wt 157 lb (71.2 kg)   LMP 04/22/2016   SpO2 98%   BMI 26.13 kg/m     Wt Readings from Last 3 Encounters:  06/11/19 157 lb (71.2 kg)  11/05/18 162 lb (73.5 kg)  10/29/18 162 lb 6 oz (73.7 kg)    GEN: Well nourished, well developed in no acute distress HEENT: Normal, moist mucous membranes NECK: No JVD CARDIAC: regular rhythm, normal S1 and S2, no murmurs, rubs, gallops. She is tender to palpation along the left costochondral joint space VASCULAR: Radial and DP pulses 2+ bilaterally. No carotid bruits RESPIRATORY:  Clear to auscultation without rales, wheezing or rhonchi  ABDOMEN: Soft, non-tender, non-distended MUSCULOSKELETAL:  Ambulates independently SKIN: Warm and dry, no edema NEUROLOGIC:  Alert  and oriented x 3. No focal neuro deficits noted. PSYCHIATRIC:  Normal affect   ASSESSMENT:    1. Costochondral chest pain   2. Tobacco abuse   3. Family history of heart disease   4. Cardiac risk counseling   5. Counseling on health promotion and disease prevention   6. Essential hypertension    PLAN:    Costochondritis: tender to palpation on exam, reports this is her pain -counseled on anti-inflammatories, hot pack -if not better by next visit, will plan for coronary CT. Discussed at length today.  Tobacco abuse: The patient was counseled on tobacco cessation today for 4 minutes.  Counseling included reviewing the risks of smoking tobacco products, how it impacts the patient's current medical diagnoses and different strategies for quitting.  Pharmacotherapy to aid in tobacco cessation was not prescribed today.  Prevention and health counseling: has significant family history of heart disease -recommend heart healthy/Mediterranean diet, with whole grains, fruits, vegetable, fish, lean meats, nuts, and olive oil. Limit salt. -recommend moderate walking, 3-5 times/week for 30-50 minutes each session. Aim for at least 150 minutes.week. Goal should be pace of 3 miles/hours, or walking 1.5 miles in 30 minutes -recommend avoidance of tobacco products. Avoid excess alcohol. -Additional risk factor control:  -Diabetes: A1c not available  -Lipids: from 2017: HDL 64, LDL 85, TG 107, Tchol 170. Discussed redrawing, re-evaluate at follow up.  -Blood pressure control: near goal of <130/80, uncomfortable today  -Weight: BMI 26 -ASCVD risk score: 2.6%  Plan for follow up: 2 mos to monitor symptoms  TIME SPENT WITH PATIENT: 25 minutes of direct patient care. More than 50% of that time was spent on coordination of care and counseling regarding chest pain, options for evaluation, red flag warning signs.  Buford Dresser, MD, PhD [place]  [facility]   Medication Adjustments/Labs  and Tests Ordered: Current medicines are reviewed at length with the patient today.  Concerns regarding medicines are outlined above.  No orders of the defined types were placed in this encounter.  No orders of the defined types were placed in this encounter.   Patient Instructions  Medication Instructions:  Your Physician recommend you continue on your current medication as directed.    If you need a refill on your  cardiac medications before your next appointment, please call your pharmacy.   Lab work: None  Testing/Procedures: None  Follow-Up: At [facility], you and your health needs are our priority.  As part of our continuing mission to provide you with exceptional heart care, we have created designated Provider Care Teams.  These Care Teams include your primary Cardiologist (physician) and Advanced Practice Providers (APPs -  Physician Assistants and Nurse Practitioners) who all work together to provide you with the care you need, when you need it. You will need a follow up appointment in 2 months.  Please call our office 2 months in advance to schedule this appointment.  You may see Buford Dresser, MD or one of the following Advanced Practice Providers on your designated Care Team:   Rosaria Ferries, PA-C . Jory Sims, DNP, ANP       Signed, Buford Dresser, MD PhD 06/11/2019 8:04 PM    [place]

## 2019-06-11 NOTE — Patient Instructions (Signed)

## 2019-06-13 ENCOUNTER — Encounter: Payer: Self-pay | Admitting: Cardiology

## 2019-07-29 ENCOUNTER — Other Ambulatory Visit: Payer: Self-pay

## 2019-07-29 ENCOUNTER — Ambulatory Visit (INDEPENDENT_AMBULATORY_CARE_PROVIDER_SITE_OTHER): Payer: PRIVATE HEALTH INSURANCE | Admitting: Family Medicine

## 2019-07-29 VITALS — BP 118/64 | HR 92 | Ht 65.0 in | Wt 156.0 lb

## 2019-07-29 DIAGNOSIS — G8929 Other chronic pain: Secondary | ICD-10-CM | POA: Insufficient documentation

## 2019-07-29 DIAGNOSIS — M25512 Pain in left shoulder: Secondary | ICD-10-CM | POA: Diagnosis not present

## 2019-07-29 MED ORDER — BACLOFEN 10 MG PO TABS: 10.0000 mg | ORAL_TABLET | Freq: Three times a day (TID) | ORAL | 0 refills | Status: DC

## 2019-07-29 MED ORDER — NAPROXEN 500 MG PO TABS: 500.0000 mg | ORAL_TABLET | Freq: Two times a day (BID) | ORAL | 0 refills | Status: DC

## 2019-07-29 NOTE — Assessment & Plan Note (Signed)
Patient following up for acute on chronic left shoulder pain.  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  No acute injury and history.  Possibly rotator cuff.  No history of diabetes, impingement unlikely. Given that it has only been severe over the past days to weeks, would recommend trialing NSAIDs, muscle relaxants, continue heat and cold as needed, continue at home exercises.  Patient follow-up in 2 weeks if no improvement, would recommend possibly getting x-ray versus referral to sports medicine for [facility] ultrasound.

## 2019-07-29 NOTE — Patient Instructions (Signed)
Shoulder Pain Many things can cause shoulder pain, including:  An injury.  Moving the shoulder in the same way again and again (overuse).  Joint pain (arthritis). Pain can come from:  Swelling and irritation (inflammation) of any part of the shoulder.  An injury to the shoulder joint.  An injury to: ? Tissues that connect muscle to bone (tendons). ? Tissues that connect bones to each other (ligaments). ? Bones. Follow these instructions at home: Watch for changes in your symptoms. Let your doctor know about them. Follow these instructions to help with your pain. If you have a sling:  Wear the sling as told by your doctor. Remove it only as told by your doctor.  Loosen the sling if your fingers: ? Tingle. ? Become numb. ? Turn cold and blue.  Keep the sling clean.  If the sling is not waterproof: ? Do not let it get wet. ? Take the sling off when you shower or bathe. Managing pain, stiffness, and swelling   If told, put ice on the painful area: ? Put ice in a plastic bag. ? Place a towel between your skin and the bag. ? Leave the ice on for 20 minutes, 2-3 times a day. Stop putting ice on if it does not help with the pain.  Squeeze a soft ball or a foam pad as much as possible. This prevents swelling in the shoulder. It also helps to strengthen the arm. General instructions  Take over-the-counter and prescription medicines only as told by your doctor.  Keep all follow-up visits as told by your doctor. This is important. Contact a doctor if:  Your pain gets worse.  Medicine does not help your pain.  You have new pain in your arm, hand, or fingers. Get help right away if:  Your arm, hand, or fingers: ? Tingle. ? Are numb. ? Are swollen. ? Are painful. ? Turn white or blue. Summary  Shoulder pain can be caused by many things. These include injury, moving the shoulder in the same away again and again, and joint pain.  Watch for changes in your symptoms.  Let your doctor know about them.  This condition may be treated with a sling, ice, and pain medicine.  Contact your doctor if the pain gets worse or you have new pain. Get help right away if your arm, hand, or fingers tingle or get numb, swollen, or painful.  Keep all follow-up visits as told by your doctor. This is important. This information is not intended to replace advice given to you by your health care provider. Make sure you discuss any questions you have with your health care provider. Document Released: 02/15/2008 Document Revised: 03/13/2018 Document Reviewed: 03/13/2018 Elsevier Patient Education  2020 [employer]  

## 2019-07-29 NOTE — Progress Notes (Signed)
Subjective:  Cheryl Carlson is a 48 y.o. female who presents to the [facility] today with a chief complaint of left shoulder pain.   HPI:  Patient reports that she has had left shoulder pain for a year.  It has been worse over the past 3 weeks and particularly worse over the past few days.  Patient says she has been trying at home shoulder exercises that she followed on the Internet.  She has also tried heat and ice.  Over the past few days she has been taking ibuprofen and Tylenol.  Ibuprofen seems to help the most.  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  Patient denies any trauma to her arm.  Patient endorses some numbness when she lies on her shoulder.  Denies numbness or tingling or weakness when she is not lying on it.  Pain is predominantly on the anterior portion of her shoulder.  Does radiate a little bit to her neck.  It is worse with over reach.  Patient denies any history of MI.  Patient recently followed up with cardiology, who felt that she had some chest pain that was costochondritis.  They do recommend getting a cardiac CT.  Cardiology was recommended patient use NSAIDs at that time.  ROS: Per HPI  PMH: Smoking history reviewed.    Objective:  Physical Exam: BP 118/64   Pulse 92   Ht 5\' 5"  (1.651 m)   Wt 156 lb (70.8 kg)   LMP 04/22/2016   SpO2 99%   BMI 25.96 kg/m   Gen: NAD, resting comfortably CV: RRR with no murmurs appreciated Pulm: NWOB, CTAB with no crackles, wheezes, or rhonchi GI: Normal bowel sounds present. Soft, Nontender, Nondistended. Left shoulder Inspection: No gross deformity or bruising Palpation: Left shoulder nontender to palpation ROM: Range of motion limited with overhead reach, across the shoulder, behind the back wall due to pain, patient has limited due to pain external rotation  with resistance, patient with limited internal rotation due to pain Strength: Patient has 5 of 5 bicep  strength, patient has 4  Of 5 tricep extension, limited due to pain, has normal grip strength Stability: Special tests: Vascular studies:  Skin: warm, dry Neuro: grossly normal, moves all extremities Psych: Normal affect and thought content  No results found for this or any previous visit (from the past 72 hour(s)).   Assessment/Plan:  Chronic left shoulder pain Patient following up for acute on chronic left shoulder pain.  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  No acute injury and history.  Possibly rotator cuff.  No history of diabetes, impingement unlikely. Given that it has only been severe over the past days to weeks, would recommend trialing NSAIDs, muscle relaxants, continue heat and cold as needed, continue at home exercises.  Patient follow-up in 2 weeks if no improvement, would recommend possibly getting x-ray versus referral to sports medicine for MSK ultrasound.  Precepted with Dr. McDiarmid.  Lab Orders  No laboratory test(s) ordered today    Meds ordered this encounter  Medications  . naproxen (NAPROSYN) 500 MG tablet    Sig: Take 1 tablet (500 mg total) by mouth 2 (two) times daily with a meal for 14 days. Take as needed for shoulder pain    Dispense:  28 tablet    Refill:  0  . baclofen (LIORESAL) 10 MG tablet    Sig: Take 1  tablet (10 mg total) by mouth 3 (three) times daily. Take as needed for muscle spasm    Dispense:  30 each    Refill:  [place], MD, MS FAMILY MEDICINE RESIDENT - PGY3 07/29/2019 3:17 PM

## 2019-08-05 ENCOUNTER — Other Ambulatory Visit: Payer: Self-pay | Admitting: Family Medicine

## 2019-08-05 DIAGNOSIS — M25512 Pain in left shoulder: Secondary | ICD-10-CM

## 2019-08-05 DIAGNOSIS — G8929 Other chronic pain: Secondary | ICD-10-CM

## 2019-08-07 ENCOUNTER — Ambulatory Visit (INDEPENDENT_AMBULATORY_CARE_PROVIDER_SITE_OTHER): Payer: PRIVATE HEALTH INSURANCE | Admitting: Cardiology

## 2019-08-07 ENCOUNTER — Encounter: Payer: Self-pay | Admitting: Cardiology

## 2019-08-07 VITALS — BP 123/85 | HR 97 | Ht 65.0 in | Wt 156.6 lb

## 2019-08-07 DIAGNOSIS — Z131 Encounter for screening for diabetes mellitus: Secondary | ICD-10-CM | POA: Diagnosis not present

## 2019-08-07 DIAGNOSIS — I10 Essential (primary) hypertension: Secondary | ICD-10-CM

## 2019-08-07 DIAGNOSIS — R350 Frequency of micturition: Secondary | ICD-10-CM

## 2019-08-07 DIAGNOSIS — Z7189 Other specified counseling: Secondary | ICD-10-CM

## 2019-08-07 DIAGNOSIS — Z720 Tobacco use: Secondary | ICD-10-CM | POA: Diagnosis not present

## 2019-08-07 DIAGNOSIS — R0789 Other chest pain: Secondary | ICD-10-CM

## 2019-08-07 DIAGNOSIS — Z13220 Encounter for screening for lipoid disorders: Secondary | ICD-10-CM

## 2019-08-07 DIAGNOSIS — Z8249 Family history of ischemic heart disease and other diseases of the circulatory system: Secondary | ICD-10-CM

## 2019-08-07 NOTE — Progress Notes (Signed)
Cardiology Office Note:    Date:  08/07/2019   ID:  Cheryl Carlson, DOB [birth year], MRN [id number]  PCP:  Guadalupe Dawn, MD  Cardiologist:  Buford Dresser, MD PhD  Referring MD: Guadalupe Dawn, MD   CC: chest pain  History of Present Illness:    Cheryl Carlson is a 47 y.o. female with a hx of HTN, MVP, GERD, tobacco abuse who is seen in follow up today. I initially met her 07/31/18.  Cardiac history/risk: She has significant family history of CAD; brother had MI and had ICD implanted at age 10.  Has had 2 week monitor placed for palpitations, treadmill exercise for chest pain, and echocardiogram for mitral valve prolapse. All testing was normal.  Today: Tore her shoulder muscle, muscle relaxer too strong to take all the time. However, this seems to have identified the source of her pain, for which she is thankful. No further pain beyond her shoulder pain.  Tobacco use: plans to quit after the first of the year. Has home stress right now. We discussed CV risk factors and tobacco cessation today.  ROS: has been urinating a lot, always thirsty. Has family members with diabetes, has never been screened. Also has not had lipids checked in years.  Denies chest pain, shortness of breath at rest or with normal exertion. No PND, orthopnea, LE edema or unexpected weight gain. No syncope or palpitations.   Past Medical History:  Diagnosis Date  . Hypertension   . Mitral valve prolapse     Past Surgical History:  Procedure Laterality Date  . APPENDECTOMY    . uterine tumor removal  2011    Current Medications: Current Outpatient Medications on File Prior to Visit  Medication Sig  . amLODipine (NORVASC) 10 MG tablet Take 1 tablet (10 mg total) by mouth daily.  Marland Kitchen aspirin EC 81 MG tablet Take 81 mg by mouth daily.  . baclofen (LIORESAL) 10 MG tablet TAKE 1 TABLET(10 MG) BY MOUTH THREE TIMES DAILY AS NEEDED FOR MUSCLE SPASM  . Multiple Vitamins-Minerals (HAIR/SKIN/NAILS)  TABS Take 1 tablet by mouth daily.   . naproxen (NAPROSYN) 500 MG tablet Take 1 tablet (500 mg total) by mouth 2 (two) times daily with a meal for 14 days. Take as needed for shoulder pain  . omeprazole (PRILOSEC) 40 MG capsule Take 1 capsule (40 mg total) by mouth every morning. Take 30 minutes before breakfast   No current facility-administered medications on file prior to visit.      Allergies:   Patient has no known allergies.   Social History   Tobacco Use  . Smoking status: Current Every Day Smoker    Packs/day: 0.50    Types: Cigarettes  . Smokeless tobacco: Never Used  Substance Use Topics  . Alcohol use: Yes    Comment: socially  . Drug use: No    Family History: The patient's family history includes Diabetes in her maternal grandmother and mother; Heart attack in her brother; Heart attack (age of onset: 78) in her brother; Heart disease in her mother; Hypertension in her father and mother. There is no history of Stomach cancer or Esophageal cancer.  ROS:   Please see the history of present illness.  Additional pertinent ROS: Constitutional: Negative for chills, fever, night sweats, unintentional weight loss  HENT: Negative for ear pain and hearing loss.   Eyes: Negative for loss of vision and eye pain.  Respiratory: Negative for cough, sputum, wheezing.   Cardiovascular: See HPI. Gastrointestinal: Negative  for abdominal pain, melena, and hematochezia.  Genitourinary: Negative for dysuria and hematuria.  Musculoskeletal: Negative for falls and myalgias.  Skin: Negative for itching and rash.  Neurological: Negative for focal weakness, focal sensory changes and loss of consciousness.  Endo/Heme/Allergies: Does not bruise/bleed easily.   EKGs/Labs/Other Studies Reviewed:    The following studies were reviewed today:  05/10/18 ETT  Blood pressure demonstrated a normal response to exercise.  There was no ST segment deviation noted during stress.   1. Good exercise  tolerance.  2. No ischemic ST segment changes.   06/01/18 Monitor Monitor worn for 3 days 15 hours. No afib or significant arrhythmia detected. Less than 1% ectopic beats. Baseline is sinus rhythm at 66 bpm. 12 patient triggered events, all were sinus rhythm or sinus tachycardia. Of note, 20% of her recording was sinus tachycardia, remaining 80% was normal sinus rhythm.  05/03/18 Echo  Study Conclusions - Left ventricle: The cavity size was normal. Systolic function was   normal. The estimated ejection fraction was in the range of 60%   to 65%. Wall motion was normal; there were no regional wall   motion abnormalities. Left ventricular diastolic function   parameters were normal. - Aortic valve: Transvalvular velocity was within the normal range.   There was no stenosis. There was no regurgitation. - Mitral valve: Transvalvular velocity was within the normal range.   There was no evidence for stenosis. There was no regurgitation. - Right ventricle: The cavity size was normal. Wall thickness was   normal. Systolic function was normal. - Atrial septum: No defect or patent foramen ovale was identified   by color flow Doppler. - Tricuspid valve: There was trivial regurgitation. - Pulmonary arteries: Systolic pressure was within the normal   range. PA peak pressure: 23 mm Hg (S).   EKG:  EKG is personally reviewed.  The ekg ordered 06/05/19 demonstrates normal sinus rhythm, lack of R waves in V1-V2  Recent Labs: 06/04/2019: BUN 13; Creatinine, Ser 0.77; Hemoglobin 13.3; Platelets 409; Potassium 3.7; Sodium 140  Recent Lipid Panel    Component Value Date/Time   CHOL 170 11/30/2015 0904   TRIG 107 11/30/2015 0904   HDL 64 11/30/2015 0904   CHOLHDL 2.7 11/30/2015 0904   VLDL 21 11/30/2015 0904   LDLCALC 85 11/30/2015 0904    Physical Exam:    VS:  BP 123/85   Pulse 97   Ht _0  (1.651 m)   Wt 156 lb 9.6 oz (71 kg)   LMP 04/22/2016   SpO2 100%   BMI 26.06 kg/m     Wt Readings  from Last 3 Encounters:  08/07/19 156 lb 9.6 oz (71 kg)  07/29/19 156 lb (70.8 kg)  06/11/19 157 lb (71.2 kg)    GEN: Well nourished, well developed in no acute distress HEENT: Normal, moist mucous membranes NECK: No JVD CARDIAC: regular rhythm, normal S1 and S2, no rubs or gallops. No murmur. VASCULAR: Radial and DP pulses 2+ bilaterally. No carotid bruits RESPIRATORY:  Clear to auscultation without rales, wheezing or rhonchi  ABDOMEN: Soft, non-tender, non-distended MUSCULOSKELETAL:  Ambulates independently SKIN: Warm and dry, no edema NEUROLOGIC:  Alert and oriented x 3. No focal neuro deficits noted. PSYCHIATRIC:  Normal affect   ASSESSMENT:    1. Musculoskeletal chest pain   2. Encounter for screening for diabetes mellitus   3. Essential hypertension   4. Tobacco abuse   5. Family history of heart disease   6. Cardiac risk counseling  7. Counseling on health promotion and disease prevention   8. Lipid screening   9. Urinary frequency    PLAN:    MSK pain: shoulder tear identified, this localizes her pain  Urinary frequency/thirst: concern for diabetes, will screen today and get renal panel  Tobacco abuse: The patient was counseled on tobacco cessation today for 4 minutes.  Counseling included reviewing the risks of smoking tobacco products, how it impacts the patient's current medical diagnoses and different strategies for quitting.  Pharmacotherapy to aid in tobacco cessation was not prescribed today.  Prevention and health counseling: has significant family history of heart disease -recommend heart healthy/Mediterranean diet, with whole grains, fruits, vegetable, fish, lean meats, nuts, and olive oil. Limit salt. -recommend moderate walking, 3-5 times/week for 30-50 minutes each session. Aim for at least 150 minutes.week. Goal should be pace of 3 miles/hours, or walking 1.5 miles in 30 minutes -recommend avoidance of tobacco products. Avoid excess alcohol. -Additional  risk factor control:  -Lipids: from 2017: HDL 64, LDL 85, TG 107, Tchol 170. Due for recheck, ordered today  -Hypertension: at goal of <130/80  -Weight: BMI 26  Plan for follow up: 1 year or sooner PRN  Medication Adjustments/Labs and Tests Ordered: Current medicines are reviewed at length with the patient today.  Concerns regarding medicines are outlined above.  Orders Placed This Encounter  Procedures  . Basic Metabolic Panel (BMET)  . HgB A1c  . Lipid Panel With LDL/HDL Ratio   No orders of the defined types were placed in this encounter.   Patient Instructions  Medication Instructions:  Your Physician recommend you continue on your current medication as directed.    *If you need a refill on your cardiac medications before your next appointment, please call your pharmacy*  Lab Work: Your physician recommends that you return for lab work today (BMP, Lipid, A1C)  Testing/Procedures: None  Follow-Up: At [facility], you and your health needs are our priority.  As part of our continuing mission to provide you with exceptional heart care, we have created designated Provider Care Teams.  These Care Teams include your primary Cardiologist (physician) and Advanced Practice Providers (APPs -  Physician Assistants and Nurse Practitioners) who all work together to provide you with the care you need, when you need it.  Your next appointment:   1 year(s)  The format for your next appointment:   In Person  Provider:   Buford Dresser, MD       Signed, Buford Dresser, MD PhD 08/07/2019 6:30 PM    [place]

## 2019-08-07 NOTE — Patient Instructions (Addendum)
Medication Instructions:  Your Physician recommend you continue on your current medication as directed.    *If you need a refill on your cardiac medications before your next appointment, please call your pharmacy*  Lab Work: Your physician recommends that you return for lab work today (BMP, Lipid, A1C)  Testing/Procedures: None  Follow-Up: At [facility], you and your health needs are our priority.  As part of our continuing mission to provide you with exceptional heart care, we have created designated Provider Care Teams.  These Care Teams include your primary Cardiologist (physician) and Advanced Practice Providers (APPs -  Physician Assistants and Nurse Practitioners) who all work together to provide you with the care you need, when you need it.  Your next appointment:   1 year(s)  The format for your next appointment:   In Person  Provider:   Buford Dresser, MD

## 2019-08-08 LAB — BASIC METABOLIC PANEL
BUN/Creatinine Ratio: 21 (ref 9–23)
BUN: 16 mg/dL (ref 6–24)
CO2: 24 mmol/L (ref 20–29)
Calcium: 9.8 mg/dL (ref 8.7–10.2)
Chloride: 104 mmol/L (ref 96–106)
Creatinine, Ser: 0.78 mg/dL (ref 0.57–1.00)
GFR calc Af Amer: 103 mL/min/{1.73_m2} (ref >59)
GFR calc non Af Amer: 89 mL/min/{1.73_m2} (ref >59)
Glucose: 85 mg/dL (ref 65–99)
Potassium: 4.4 mmol/L (ref 3.5–5.2)
Sodium: 142 mmol/L (ref 134–144)

## 2019-08-08 LAB — LIPID PANEL WITH LDL/HDL RATIO
Cholesterol, Total: 179 mg/dL (ref 100–199)
HDL: 52 mg/dL (ref >39)
LDL Chol Calc (NIH): 105 mg/dL — ABNORMAL HIGH (ref 0–99)
LDL/HDL Ratio: 2 ratio (ref 0.0–3.2)
Triglycerides: 124 mg/dL (ref 0–149)
VLDL Cholesterol Cal: 22 mg/dL (ref 5–40)

## 2019-08-08 LAB — HEMOGLOBIN A1C
Est. average glucose Bld gHb Est-mCnc: 114 mg/dL
Hgb A1c MFr Bld: 5.6 % (ref 4.8–5.6)

## 2019-08-09 ENCOUNTER — Other Ambulatory Visit: Payer: Self-pay | Admitting: Family Medicine

## 2019-08-09 DIAGNOSIS — G8929 Other chronic pain: Secondary | ICD-10-CM

## 2019-08-09 DIAGNOSIS — M25512 Pain in left shoulder: Secondary | ICD-10-CM

## 2019-08-20 ENCOUNTER — Other Ambulatory Visit: Payer: Self-pay | Admitting: Family Medicine

## 2019-08-20 DIAGNOSIS — G8929 Other chronic pain: Secondary | ICD-10-CM

## 2019-08-24 ENCOUNTER — Other Ambulatory Visit: Payer: Self-pay | Admitting: Family Medicine

## 2019-08-24 DIAGNOSIS — G8929 Other chronic pain: Secondary | ICD-10-CM

## 2019-08-27 ENCOUNTER — Other Ambulatory Visit: Payer: Self-pay | Admitting: *Deleted

## 2019-08-27 DIAGNOSIS — G8929 Other chronic pain: Secondary | ICD-10-CM

## 2019-08-27 MED ORDER — NAPROXEN 500 MG PO TABS: ORAL_TABLET | ORAL | 0 refills | Status: DC

## 2019-09-09 ENCOUNTER — Other Ambulatory Visit: Payer: Self-pay | Admitting: Family Medicine

## 2019-09-09 DIAGNOSIS — G8929 Other chronic pain: Secondary | ICD-10-CM

## 2019-09-09 DIAGNOSIS — M25512 Pain in left shoulder: Secondary | ICD-10-CM

## 2019-09-11 ENCOUNTER — Telehealth: Payer: Self-pay

## 2019-09-11 NOTE — Telephone Encounter (Signed)
Pt calling nurse line to see if it is safe for her to take dietary supplement, Super beets. (see below) Pt ordered them because they are supposed to be good for heart health and blood pressure and wanted to make sure that it was safe and would not interact with any of her other medications.     Please advise.   Talbot Grumbling, RN

## 2019-09-13 NOTE — Telephone Encounter (Signed)
Yes this is fine for her to take. Thanks!

## 2019-09-24 ENCOUNTER — Encounter: Payer: Self-pay | Admitting: Internal Medicine

## 2019-09-25 ENCOUNTER — Other Ambulatory Visit: Payer: Self-pay | Admitting: Family Medicine

## 2019-09-25 DIAGNOSIS — G8929 Other chronic pain: Secondary | ICD-10-CM

## 2019-09-25 DIAGNOSIS — M25512 Pain in left shoulder: Secondary | ICD-10-CM

## 2019-09-30 ENCOUNTER — Other Ambulatory Visit: Payer: Self-pay | Admitting: Family Medicine

## 2019-09-30 DIAGNOSIS — M25512 Pain in left shoulder: Secondary | ICD-10-CM

## 2019-09-30 DIAGNOSIS — G8929 Other chronic pain: Secondary | ICD-10-CM

## 2019-10-14 ENCOUNTER — Other Ambulatory Visit: Payer: Self-pay

## 2019-10-14 ENCOUNTER — Ambulatory Visit (AMBULATORY_SURGERY_CENTER): Payer: Self-pay | Admitting: *Deleted

## 2019-10-14 VITALS — Temp 97.7°F | Ht 65.0 in | Wt 155.8 lb

## 2019-10-14 DIAGNOSIS — Z1211 Encounter for screening for malignant neoplasm of colon: Secondary | ICD-10-CM

## 2019-10-14 DIAGNOSIS — Z01818 Encounter for other preprocedural examination: Secondary | ICD-10-CM

## 2019-10-14 NOTE — Progress Notes (Signed)
Pt is aware that care partner will wait in the car during procedure; if they feel like they will be too hot or cold to wait in the car; they may wait in the 4 th floor lobby. Patient is aware to bring only one care partner. We want them to wear a mask (we do not have any that we can provide them), practice social distancing, and we will check their temperatures when they get here.  I did remind the patient that their care partner needs to stay in the parking lot the entire time and have a cell phone available, we will call them when the pt is ready for discharge. Patient will wear mask into building.  Pt states "I had a slow time waking up from Propofol last time."  No egg or soy allergy  No home oxygen use   No medications for weight loss taken  emmi information given  covid test 10-24-19 at 2:40 pm  Work note given for today and also for procedure date

## 2019-10-16 ENCOUNTER — Other Ambulatory Visit: Payer: Self-pay

## 2019-10-16 DIAGNOSIS — G8929 Other chronic pain: Secondary | ICD-10-CM

## 2019-10-16 DIAGNOSIS — M25512 Pain in left shoulder: Secondary | ICD-10-CM

## 2019-10-16 MED ORDER — NAPROXEN 500 MG PO TABS: ORAL_TABLET | ORAL | 0 refills | Status: DC

## 2019-10-23 ENCOUNTER — Encounter: Payer: Self-pay | Admitting: Internal Medicine

## 2019-10-24 ENCOUNTER — Ambulatory Visit (INDEPENDENT_AMBULATORY_CARE_PROVIDER_SITE_OTHER): Payer: 59

## 2019-10-24 ENCOUNTER — Other Ambulatory Visit: Payer: Self-pay

## 2019-10-24 ENCOUNTER — Other Ambulatory Visit: Payer: Self-pay | Admitting: Internal Medicine

## 2019-10-24 DIAGNOSIS — Z1159 Encounter for screening for other viral diseases: Secondary | ICD-10-CM

## 2019-10-25 LAB — SARS CORONAVIRUS 2 (TAT 6-24 HRS): SARS Coronavirus 2: NEGATIVE

## 2019-10-28 ENCOUNTER — Ambulatory Visit (AMBULATORY_SURGERY_CENTER): Payer: 59 | Admitting: Internal Medicine

## 2019-10-28 ENCOUNTER — Other Ambulatory Visit: Payer: Self-pay

## 2019-10-28 ENCOUNTER — Encounter: Payer: Self-pay | Admitting: Internal Medicine

## 2019-10-28 ENCOUNTER — Other Ambulatory Visit: Payer: Self-pay | Admitting: *Deleted

## 2019-10-28 VITALS — BP 119/74 | HR 83 | Temp 97.1°F | Resp 14 | Ht 65.0 in | Wt 155.0 lb

## 2019-10-28 DIAGNOSIS — D125 Benign neoplasm of sigmoid colon: Secondary | ICD-10-CM

## 2019-10-28 DIAGNOSIS — K635 Polyp of colon: Secondary | ICD-10-CM

## 2019-10-28 DIAGNOSIS — G8929 Other chronic pain: Secondary | ICD-10-CM

## 2019-10-28 DIAGNOSIS — Z1211 Encounter for screening for malignant neoplasm of colon: Secondary | ICD-10-CM

## 2019-10-28 DIAGNOSIS — M25512 Pain in left shoulder: Secondary | ICD-10-CM

## 2019-10-28 MED ORDER — SODIUM CHLORIDE 0.9 % IV SOLN: 500.0000 mL | Freq: Once | INTRAVENOUS | Status: DC

## 2019-10-28 MED ORDER — NAPROXEN 500 MG PO TABS: ORAL_TABLET | ORAL | 0 refills | Status: DC

## 2019-10-28 NOTE — Patient Instructions (Addendum)
I found and removed one tiny polyp. It looks benign but could be pre-cancerous (common finding).  I will let you know pathology results and when to have another routine colonoscopy by mail and/or My Chart.  You also have a condition called diverticulosis - common and not usually a problem. Please read the handout provided.  I appreciate the opportunity to care for you. Gatha Mayer, MD, FACG   YOU HAD AN ENDOSCOPIC PROCEDURE TODAY AT [facility]:   Refer to the procedure report that was given to you for any specific questions about what was found during the examination.  If the procedure report does not answer your questions, please call your gastroenterologist to clarify.  If you requested that your care partner not be given the details of your procedure findings, then the procedure report has been included in a sealed envelope for you to review at your convenience later.  YOU SHOULD EXPECT: Some feelings of bloating in the abdomen. Passage of more gas than usual.  Walking can help get rid of the air that was put into your GI tract during the procedure and reduce the bloating. If you had a lower endoscopy (such as a colonoscopy or flexible sigmoidoscopy) you may notice spotting of blood in your stool or on the toilet paper. If you underwent a bowel prep for your procedure, you may not have a normal bowel movement for a few days.  Please Note:  You might notice some irritation and congestion in your nose or some drainage.  This is from the oxygen used during your procedure.  There is no need for concern and it should clear up in a day or so.  SYMPTOMS TO REPORT IMMEDIATELY:   Following lower endoscopy (colonoscopy or flexible sigmoidoscopy):  Excessive amounts of blood in the stool  Significant tenderness or worsening of abdominal pains  Swelling of the abdomen that is new, acute  Fever of 100F or higher  For urgent or emergent issues, a gastroenterologist can be reached  at any hour by calling [phone].   DIET:  We do recommend a small meal at first, but then you may proceed to your regular diet.  Drink plenty of fluids but you should avoid alcoholic beverages for 24 hours.  ACTIVITY:  You should plan to take it easy for the rest of today and you should NOT DRIVE or use heavy machinery until tomorrow (because of the sedation medicines used during the test).    FOLLOW UP: Our staff will call the number listed on your records 48-72 hours following your procedure to check on you and address any questions or concerns that you may have regarding the information given to you following your procedure. If we do not reach you, we will leave a message.  We will attempt to reach you two times.  During this call, we will ask if you have developed any symptoms of COVID 19. If you develop any symptoms (ie: fever, flu-like symptoms, shortness of breath, cough etc.) before then, please call [phone].  If you test positive for Covid 19 in the 2 weeks post procedure, please call and report this information to [place].    If any biopsies were taken you will be contacted by phone or by letter within the next 1-3 weeks.  Please call us at [phone] if you have not heard about the biopsies in 3 weeks.    SIGNATURES/CONFIDENTIALITY: You and/or your care partner have signed paperwork which will be entered  into your electronic medical record.  These signatures attest to the fact that that the information above on your After Visit Summary has been reviewed and is understood.  Full responsibility of the confidentiality of this discharge information lies with you and/or your care-partner.

## 2019-10-28 NOTE — Progress Notes (Signed)
PT taken to PACU. Monitors in place. VSS. Report given to RN. 

## 2019-10-28 NOTE — Progress Notes (Signed)
Called to room to assist during endoscopic procedure.  Patient ID and intended procedure confirmed with present staff. Received instructions for my participation in the procedure from the performing physician.  

## 2019-10-28 NOTE — Progress Notes (Signed)
Pt's states no medical or surgical changes since previsit or office visit.  JB - temp DT - vitals 

## 2019-10-28 NOTE — Op Note (Signed)
[facility] Patient Name: Cheryl Carlson Procedure Date: 10/28/2019 7:50 AM MRN: [id number] Endoscopist: Gatha Mayer , MD Age: 49 Referring MD:  Date of Birth: 1971/03/30 Gender: Female Account #: [id number] Procedure:                Colonoscopy Indications:              Screening for colorectal malignant neoplasm, This                            is the patient's first colonoscopy Medicines:                Propofol per Anesthesia, Monitored Anesthesia Care Procedure:                Pre-Anesthesia Assessment:                           - Prior to the procedure, a History and Physical                            was performed, and patient medications and                            allergies were reviewed. The patient's tolerance of                            previous anesthesia was also reviewed. The risks                            and benefits of the procedure and the sedation                            options and risks were discussed with the patient.                            All questions were answered, and informed consent                            was obtained. Prior Anticoagulants: The patient has                            taken no previous anticoagulant or antiplatelet                            agents. ASA Grade Assessment: II - A patient with                            mild systemic disease. After reviewing the risks                            and benefits, the patient was deemed in                            satisfactory condition to undergo the procedure.  After obtaining informed consent, the colonoscope                            was passed under direct vision. Throughout the                            procedure, the patient's blood pressure, pulse, and                            oxygen saturations were monitored continuously. The                            Colonoscope was introduced through the anus and   advanced to the the cecum, identified by                            appendiceal orifice and ileocecal valve. The                            colonoscopy was performed without difficulty. The                            patient tolerated the procedure well. The quality                            of the bowel preparation was good. The ileocecal                            valve, appendiceal orifice, and rectum were                            photographed. The bowel preparation used was                            Miralax via split dose instruction. Scope In: 8:09:02 AM Scope Out: 8:23:28 AM Scope Withdrawal Time: 0 hours 12 minutes 3 seconds  Total Procedure Duration: 0 hours 14 minutes 26 seconds  Findings:                 The perianal and digital rectal examinations were                            normal.                           A diminutive polyp was found in the sigmoid colon.                            The polyp was sessile. The polyp was removed with a                            cold snare. Resection and retrieval were complete.                            Verification of patient identification  for the                            specimen was done. Estimated blood loss was minimal.                           Multiple diverticula were found in the sigmoid                            colon.                           The exam was otherwise without abnormality on                            direct and retroflexion views. Complications:            No immediate complications. Estimated Blood Loss:     Estimated blood loss was minimal. Impression:               - One diminutive polyp in the sigmoid colon,                            removed with a cold snare. Resected and retrieved.                           - Diverticulosis in the sigmoid colon.                           - The examination was otherwise normal on direct                            and retroflexion views. Recommendation:           -  Patient has a contact number available for                            emergencies. The signs and symptoms of potential                            delayed complications were discussed with the                            patient. Return to normal activities tomorrow.                            Written discharge instructions were provided to the                            patient.                           - Resume previous diet.                           - Continue present medications.                           -  Repeat colonoscopy is recommended. The                            colonoscopy date will be determined after pathology                            results from today's exam become available for                            review. Gatha Mayer, MD 10/28/2019 8:31:32 AM This report has been signed electronically.

## 2019-10-30 ENCOUNTER — Telehealth: Payer: Self-pay

## 2019-10-30 NOTE — Telephone Encounter (Signed)
  Follow up Call-  Call back number 10/28/2019 11/05/2018  Post procedure Call Back phone  # [phone]  Permission to leave phone message Yes Yes  Some recent data might be hidden     Patient questions:  Do you have a fever, pain , or abdominal swelling? No. Pain Score  0 *  Have you tolerated food without any problems? Yes.    Have you been able to return to your normal activities? Yes.    Do you have any questions about your discharge instructions: Diet   No. Medications  No. Follow up visit  No.  Do you have questions or concerns about your Care? No.  Actions: * If pain score is 4 or above: No action needed, pain <4.  1. Have you developed a fever since your procedure? no  2.   Have you had an respiratory symptoms (SOB or cough) since your procedure? no  3.   Have you tested positive for COVID 19 since your procedure no  4.   Have you had any family members/close contacts diagnosed with the COVID 19 since your procedure?  no   If yes to any of these questions please route to Joylene John, RN and Alphonsa Gin, Therapist, sports.

## 2019-11-01 ENCOUNTER — Other Ambulatory Visit: Payer: Self-pay | Admitting: *Deleted

## 2019-11-01 ENCOUNTER — Encounter: Payer: Self-pay | Admitting: Internal Medicine

## 2019-11-01 DIAGNOSIS — G8929 Other chronic pain: Secondary | ICD-10-CM

## 2019-11-01 DIAGNOSIS — M25512 Pain in left shoulder: Secondary | ICD-10-CM

## 2019-11-04 MED ORDER — BACLOFEN 10 MG PO TABS: ORAL_TABLET | ORAL | 0 refills | Status: DC

## 2019-11-28 ENCOUNTER — Other Ambulatory Visit: Payer: Self-pay | Admitting: *Deleted

## 2019-11-28 DIAGNOSIS — G8929 Other chronic pain: Secondary | ICD-10-CM

## 2019-11-28 MED ORDER — NAPROXEN 500 MG PO TABS: ORAL_TABLET | ORAL | 0 refills | Status: DC

## 2019-12-03 ENCOUNTER — Other Ambulatory Visit: Payer: Self-pay | Admitting: Internal Medicine

## 2020-05-12 ENCOUNTER — Other Ambulatory Visit: Payer: Self-pay | Admitting: Family Medicine

## 2020-06-22 ENCOUNTER — Ambulatory Visit: Payer: 59 | Admitting: Family Medicine

## 2020-07-01 ENCOUNTER — Ambulatory Visit: Payer: 59 | Admitting: Family Medicine

## 2020-07-20 IMAGING — DX DG CHEST 2V
2 series · 2 of 2 positions shown · non-contrast
Comparison: 12/15/2017

CLINICAL DATA: Chest pain

EXAM:
CHEST - 2 VIEW

[chest pa]
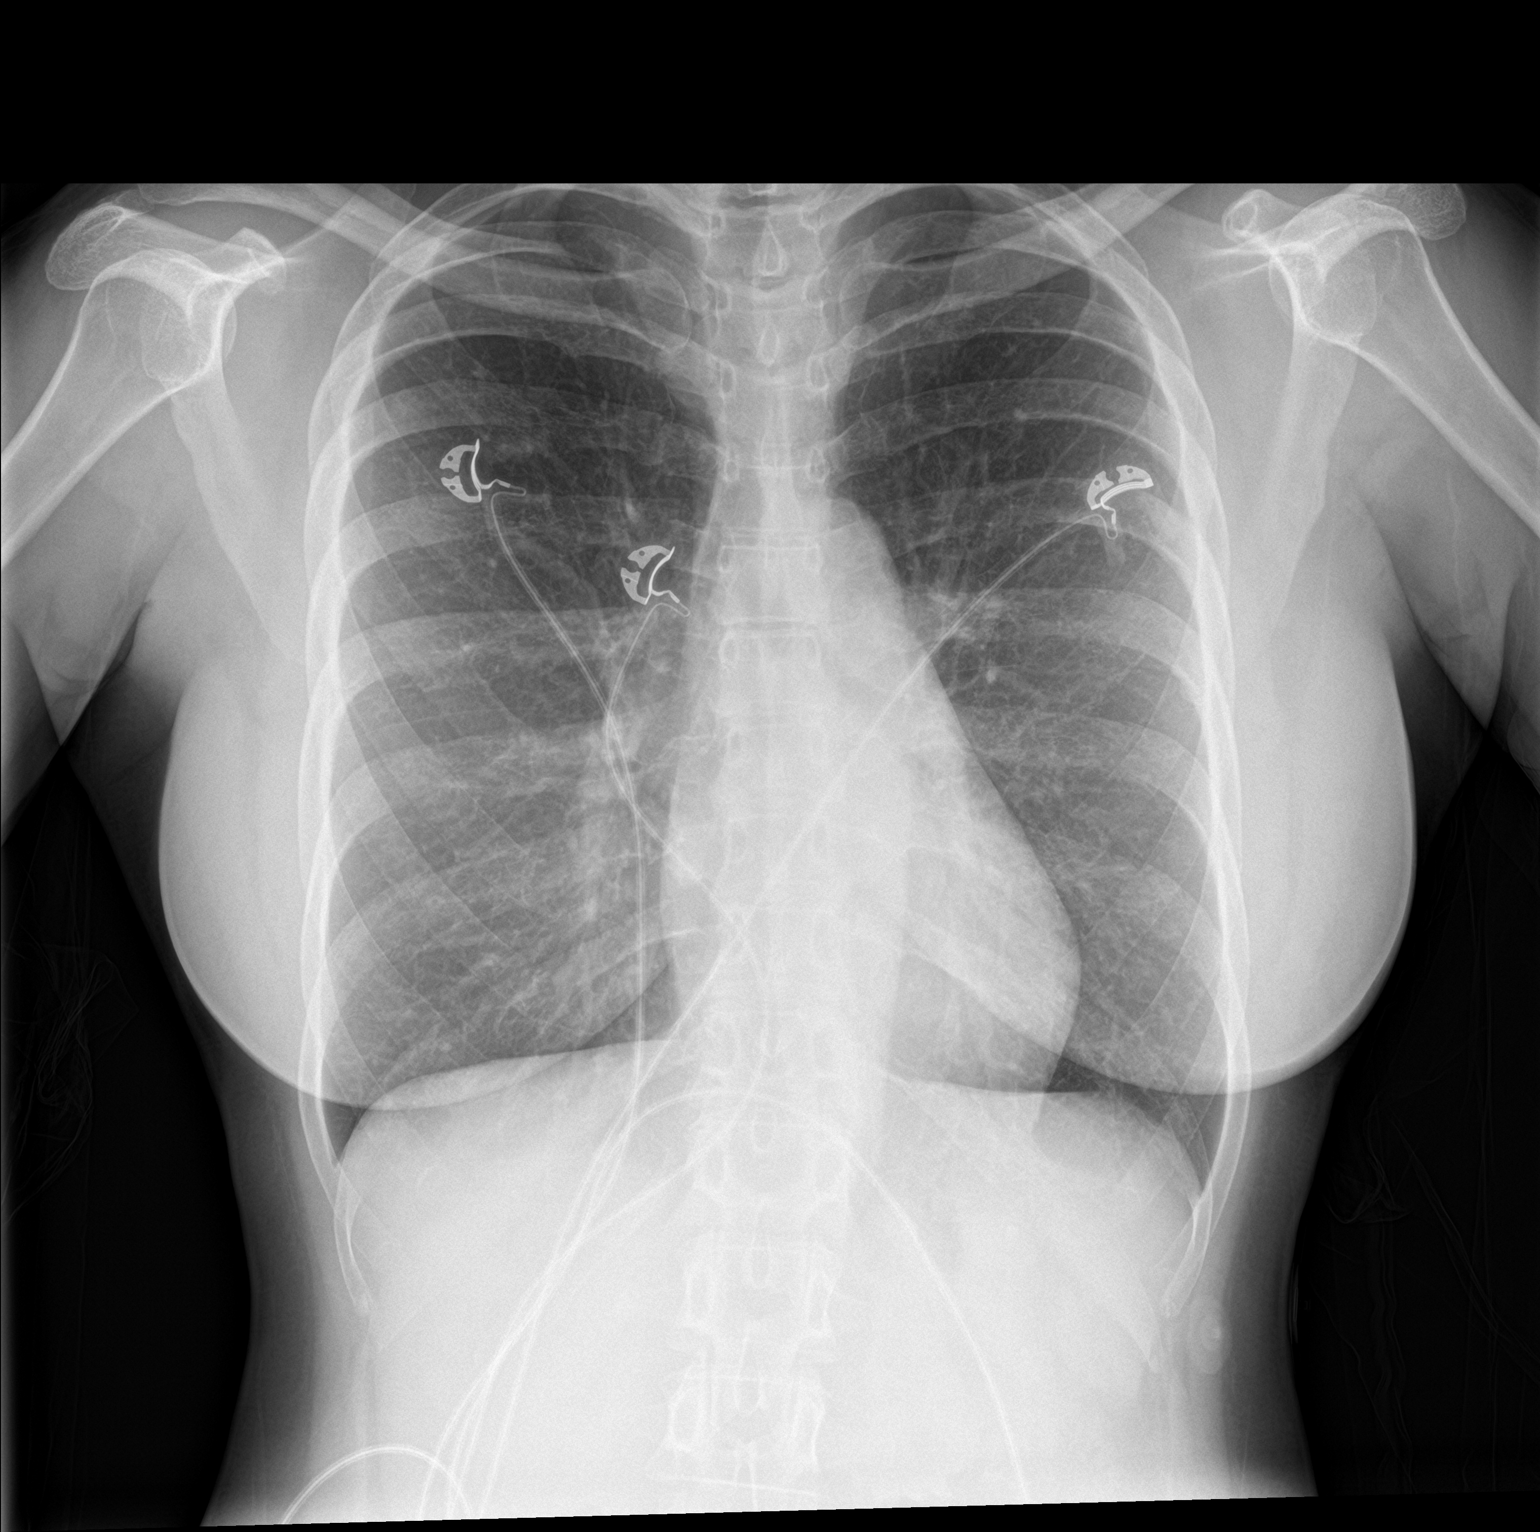

[chest lat]
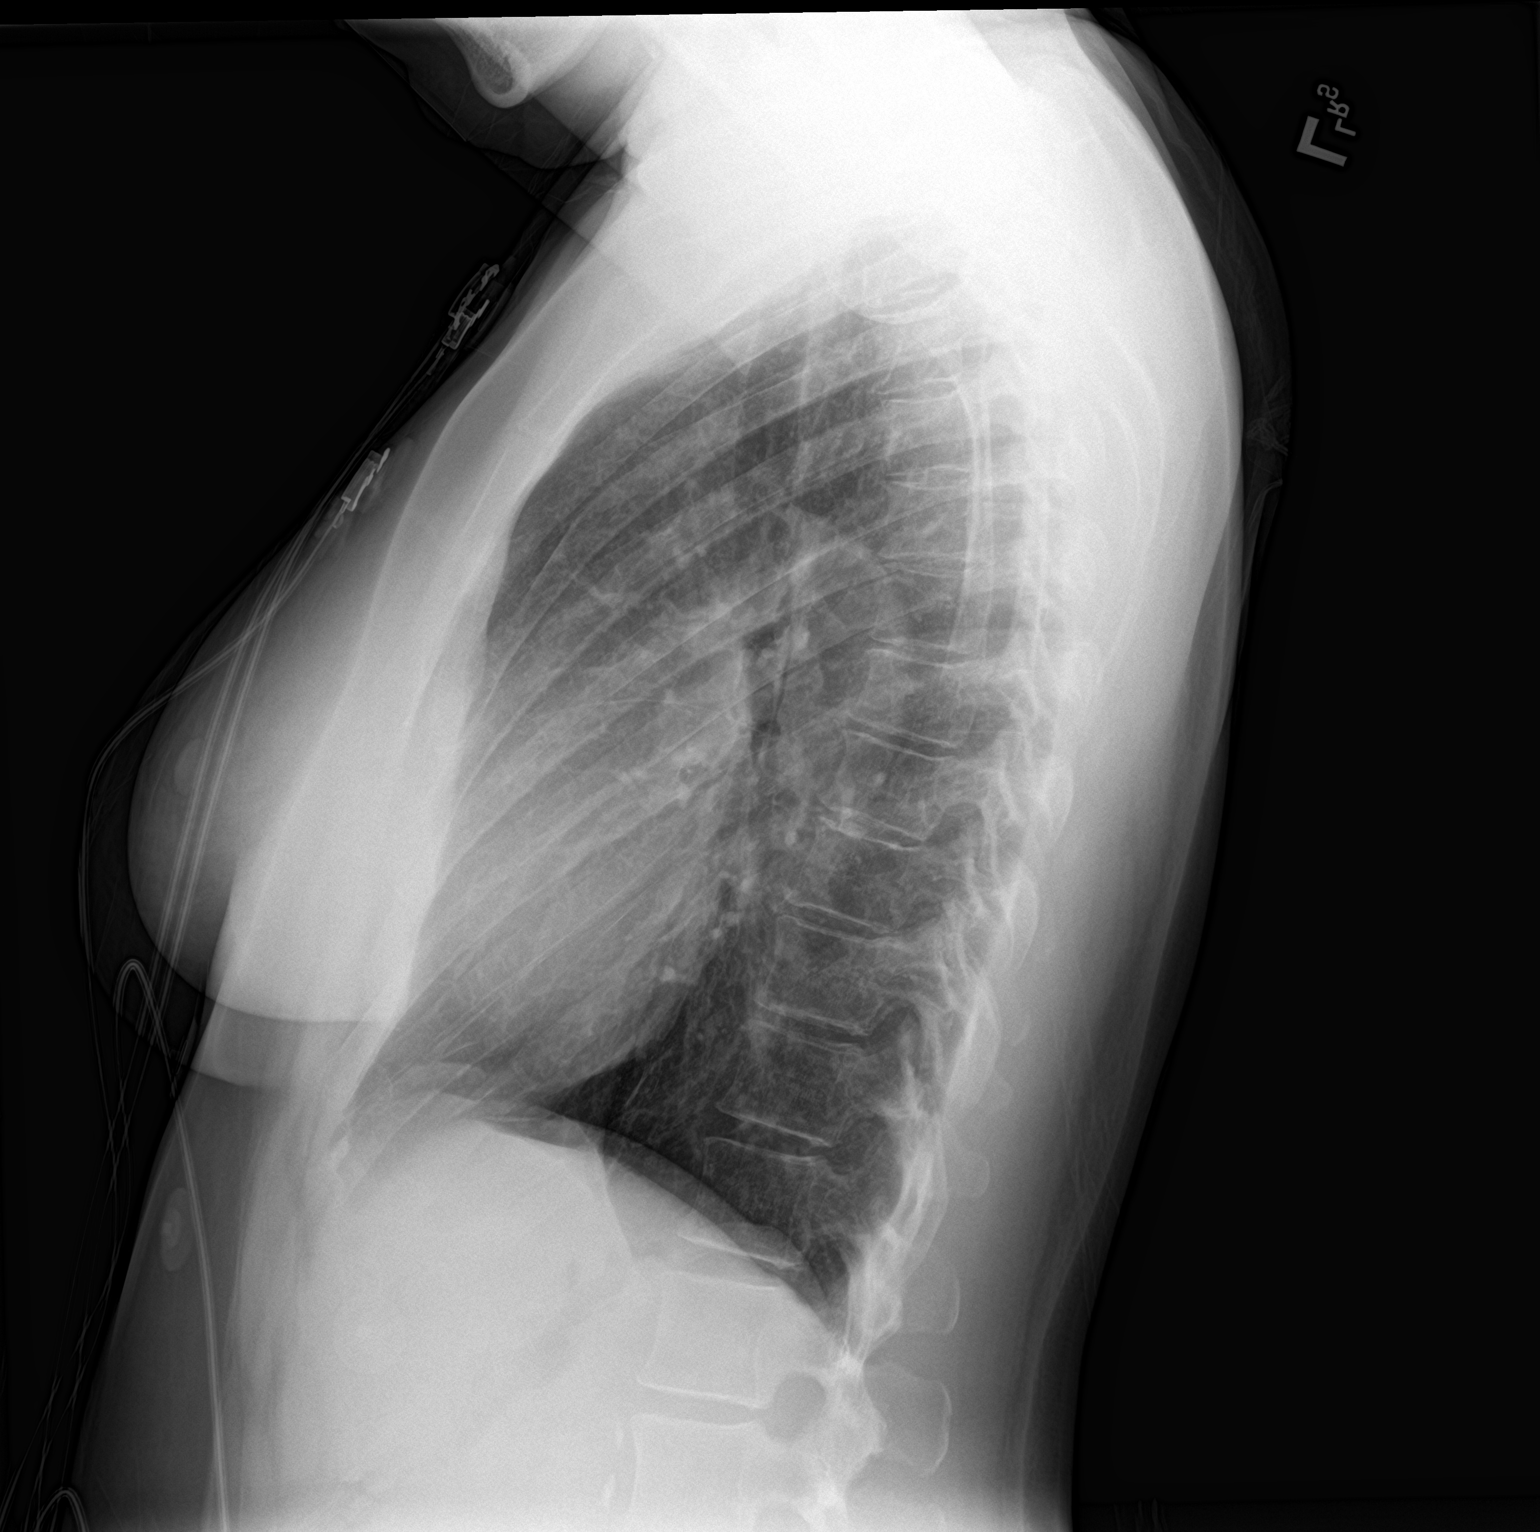

[2 of 2 positions shown; findings below may reference images not displayed]

FINDINGS: The heart size and mediastinal contours are within normal limits.
Both lungs are clear. The visualized skeletal structures are
unremarkable.
IMPRESSION: No active cardiopulmonary disease.

## 2020-08-10 ENCOUNTER — Ambulatory Visit (INDEPENDENT_AMBULATORY_CARE_PROVIDER_SITE_OTHER): Payer: Self-pay | Admitting: Cardiology

## 2020-08-10 ENCOUNTER — Encounter: Payer: Self-pay | Admitting: Cardiology

## 2020-08-10 ENCOUNTER — Other Ambulatory Visit: Payer: Self-pay

## 2020-08-10 VITALS — BP 128/70 | HR 78 | Ht 65.0 in | Wt 147.8 lb

## 2020-08-10 DIAGNOSIS — Z7189 Other specified counseling: Secondary | ICD-10-CM

## 2020-08-10 DIAGNOSIS — R0789 Other chest pain: Secondary | ICD-10-CM

## 2020-08-10 DIAGNOSIS — I10 Essential (primary) hypertension: Secondary | ICD-10-CM

## 2020-08-10 DIAGNOSIS — Z8249 Family history of ischemic heart disease and other diseases of the circulatory system: Secondary | ICD-10-CM

## 2020-08-10 NOTE — Patient Instructions (Signed)
Medication Instructions:  No changes *If you need a refill on your cardiac medications before your next appointment, please call your pharmacy*  Lab Work: None ordered this visit  Testing/Procedures: None ordered this visit  Follow-Up: At [facility], you and your health needs are our priority.  As part of our continuing mission to provide you with exceptional heart care, we have created designated Provider Care Teams.  These Care Teams include your primary Cardiologist (physician) and Advanced Practice Providers (APPs -  Physician Assistants and Nurse Practitioners) who all work together to provide you with the care you need, when you need it.  We recommend signing up for the patient portal called "MyChart".  Sign up information is provided on this After Visit Summary.  MyChart is used to connect with patients for Virtual Visits (Telemedicine).  Patients are able to view lab/test results, encounter notes, upcoming appointments, etc.  Non-urgent messages can be sent to your provider as well.   To learn more about what you can do with MyChart, go to [email or web address].    Your next appointment:   2 year(s)  You will receive a reminder letter in the mail two months in advance. If you don't receive a letter, please call our office to schedule the follow-up appointment.  The format for your next appointment:   In Person  Provider:   Buford Dresser, MD

## 2020-08-10 NOTE — Progress Notes (Signed)
Cardiology Office Note:    Date:  08/10/2020   ID:  Cheryl Carlson, DOB Jul 06, 1971, MRN [id number]  PCP:  Gladys Damme, MD  Cardiologist:  Buford Dresser, MD PhD  Referring MD: Gladys Damme, MD   CC: follow up  History of Present Illness:    Cheryl Carlson is a 49 y.o. female with a hx of HTN, MVP, GERD, tobacco abuse who is seen in follow up today. I initially met her 07/31/18.  Cardiac history/risk: She has significant family history of CAD; brother had MI and had ICD implanted at age 60.  Has had 2 week monitor placed for palpitations, treadmill exercise for chest pain, and echocardiogram for mitral valve prolapse. All testing was normal.  Today: Caring for her mother, who had Covid pneumonia and has COPD. Mother is still smoking as well, which frustrates her.  She is also still smoking, feels too stressed out with her mom to quit. Rare chest pain, mild, nonlimiting. Shoulder pain is most bothersome, awaiting evaluation and possibly surgery.   Denies shortness of breath at rest or with normal exertion. No PND, orthopnea, LE edema or unexpected weight gain. No syncope or palpitations.  We discussed Covid. She is not vaccinated, and I recommended this strongly today.  Past Medical History:  Diagnosis Date  . GERD (gastroesophageal reflux disease)   . Hypertension   . Mitral valve prolapse     Past Surgical History:  Procedure Laterality Date  . APPENDECTOMY    . UPPER GASTROINTESTINAL ENDOSCOPY    . uterine tumor removal  2011    Current Medications: Current Outpatient Medications on File Prior to Visit  Medication Sig  . amLODipine (NORVASC) 10 MG tablet TAKE 1 TABLET(10 MG) BY MOUTH DAILY  . Ascorbic Acid (VITAMIN C PO) Take by mouth daily.  Marland Kitchen aspirin EC 81 MG tablet Take 81 mg by mouth daily.  . baclofen (LIORESAL) 10 MG tablet TAKE 1 TABLET(10 MG) BY MOUTH THREE TIMES DAILY AS NEEDED FOR MUSCLE SPASM  . Cholecalciferol (VITAMIN D3 PO) Take by  mouth daily.  Marland Kitchen ELDERBERRY PO Take by mouth daily.  . Multiple Vitamin (MULTIVITAMIN ADULT PO) Take by mouth. daily  . Multiple Vitamins-Minerals (HAIR/SKIN/NAILS) TABS Take 1 tablet by mouth daily.   . naproxen (NAPROSYN) 500 MG tablet Take 1 tablet two times per day as needed for pain  . NON FORMULARY Super beets daily  . omeprazole (PRILOSEC) 40 MG capsule TAKE 1 CAPSULE(40 MG) BY MOUTH EVERY MORNING 30 MINUTES BEFORE BREAKFAST  . TURMERIC PO Take by mouth daily.  . NON FORMULARY Sea moss daily (Patient not taking: Reported on 08/10/2020)  . [DISCONTINUED] naproxen (NAPROSYN) 500 MG tablet TAKE 1 TABLET(500 MG) BY MOUTH TWICE DAILY WITH A MEAL FOR 14 DAYS AS NEEDED FOR SHOULDER OR PAIN   No current facility-administered medications on file prior to visit.     Allergies:   Patient has no known allergies.   Social History   Tobacco Use  . Smoking status: Current Every Day Smoker    Packs/day: 0.50    Types: Cigarettes  . Smokeless tobacco: Never Used  Vaping Use  . Vaping Use: Never used  Substance Use Topics  . Alcohol use: Yes    Comment: socially  . Drug use: No    Family History: The patient's family history includes Diabetes in her maternal grandmother and mother; Heart attack in her brother; Heart attack (age of onset: 38) in her brother; Heart disease in her mother; Hypertension  in her father and mother. There is no history of Stomach cancer, Esophageal cancer, Colon cancer, or Rectal cancer.  ROS:   Please see the history of present illness.  Additional pertinent ROS otherwise unremarkable.  EKGs/Labs/Other Studies Reviewed:    The following studies were reviewed today:  05/10/18 ETT  Blood pressure demonstrated a normal response to exercise.  There was no ST segment deviation noted during stress.   1. Good exercise tolerance.  2. No ischemic ST segment changes.   06/01/18 Monitor Monitor worn for 3 days 15 hours. No afib or significant arrhythmia detected.  Less than 1% ectopic beats. Baseline is sinus rhythm at 66 bpm. 12 patient triggered events, all were sinus rhythm or sinus tachycardia. Of note, 20% of her recording was sinus tachycardia, remaining 80% was normal sinus rhythm.  05/03/18 Echo  Study Conclusions - Left ventricle: The cavity size was normal. Systolic function was   normal. The estimated ejection fraction was in the range of 60%   to 65%. Wall motion was normal; there were no regional wall   motion abnormalities. Left ventricular diastolic function   parameters were normal. - Aortic valve: Transvalvular velocity was within the normal range.   There was no stenosis. There was no regurgitation. - Mitral valve: Transvalvular velocity was within the normal range.   There was no evidence for stenosis. There was no regurgitation. - Right ventricle: The cavity size was normal. Wall thickness was   normal. Systolic function was normal. - Atrial septum: No defect or patent foramen ovale was identified   by color flow Doppler. - Tricuspid valve: There was trivial regurgitation. - Pulmonary arteries: Systolic pressure was within the normal   range. PA peak pressure: 23 mm Hg (S).   EKG:  EKG is personally reviewed.  The ekg ordered today demonstrates normal sinus rhythm, lack of R waves in V1-V2, unchanged from prior  Recent Labs: No results found for requested labs within last 8760 hours.  Recent Lipid Panel    Component Value Date/Time   CHOL 179 08/07/2019 1626   TRIG 124 08/07/2019 1626   HDL 52 08/07/2019 1626   CHOLHDL 2.7 11/30/2015 0904   VLDL 21 11/30/2015 0904   LDLCALC 105 (H) 08/07/2019 1626    Physical Exam:    VS:  BP 128/70 (BP Location: Left Arm, Patient Position: Sitting)   Pulse 78   Ht _0  (1.651 m)   Wt 147 lb 12.8 oz (67 kg)   LMP 04/22/2016   SpO2 99%   BMI 24.60 kg/m     Wt Readings from Last 3 Encounters:  08/10/20 147 lb 12.8 oz (67 kg)  10/28/19 155 lb (70.3 kg)  10/14/19 155 lb 12.8  oz (70.7 kg)    GEN: Well nourished, well developed in no acute distress HEENT: Normal, moist mucous membranes NECK: No JVD CARDIAC: regular rhythm, normal S1 and S2, no rubs or gallops. No murmur. VASCULAR: Radial and DP pulses 2+ bilaterally. No carotid bruits RESPIRATORY:  Clear to auscultation without rales, wheezing or rhonchi  ABDOMEN: Soft, non-tender, non-distended MUSCULOSKELETAL:  Ambulates independently SKIN: Warm and dry, no edema NEUROLOGIC:  Alert and oriented x 3. No focal neuro deficits noted. PSYCHIATRIC:  Normal affect   ASSESSMENT:    1. Essential hypertension, benign   2. Family history of heart disease   3. Musculoskeletal chest pain   4. Educated about COVID-19 virus infection   5. Cardiac risk counseling   6. Counseling on health promotion and  disease prevention    PLAN:    Hypertension: goal <130/80 -at goal today -continue amlodipine  MSK pain: shoulder pain under evaluation, this is her source of chest pain as well  Tobacco abuse: she is precontemplative about quitting today.  Covid-19 counseling: we discussed the recommendations for vaccination together today. I recommend she receive either the Moderna or [employer] series. She will consider.  Prevention and health counseling: has significant family history of heart disease -recommend heart healthy/Mediterranean diet, with whole grains, fruits, vegetable, fish, lean meats, nuts, and olive oil. Limit salt. -recommend moderate walking, 3-5 times/week for 30-50 minutes each session. Aim for at least 150 minutes.week. Goal should be pace of 3 miles/hours, or walking 1.5 miles in 30 minutes -recommend avoidance of tobacco products. Avoid excess alcohol. -Additional risk factor control:  -Weight: BMI 24  -lipids: 2020 labs reviewed. She is under the threshold for statin -she prefers to continue daily aspirin The 10-year ASCVD risk score Mikey Bussing DC Brooke Bonito., et al., 2013) is: 7.1%   Values used to  calculate the score:     Age: 22 years     Sex: Female     Is Non-Hispanic African American: Yes     Diabetic: No     Tobacco smoker: Yes     Systolic Blood Pressure: 428 mmHg     Is BP treated: Yes     HDL Cholesterol: 52 mg/dL     Total Cholesterol: 179 mg/dL  Plan for follow up: 2 years or sooner PRN  Medication Adjustments/Labs and Tests Ordered: Current medicines are reviewed at length with the patient today.  Concerns regarding medicines are outlined above.  Orders Placed This Encounter  Procedures  . EKG 12-Lead   No orders of the defined types were placed in this encounter.   Patient Instructions  Medication Instructions:  No changes *If you need a refill on your cardiac medications before your next appointment, please call your pharmacy*  Lab Work: None ordered this visit  Testing/Procedures: None ordered this visit  Follow-Up: At [facility], you and your health needs are our priority.  As part of our continuing mission to provide you with exceptional heart care, we have created designated Provider Care Teams.  These Care Teams include your primary Cardiologist (physician) and Advanced Practice Providers (APPs -  Physician Assistants and Nurse Practitioners) who all work together to provide you with the care you need, when you need it.  We recommend signing up for the patient portal called "MyChart".  Sign up information is provided on this After Visit Summary.  MyChart is used to connect with patients for Virtual Visits (Telemedicine).  Patients are able to view lab/test results, encounter notes, upcoming appointments, etc.  Non-urgent messages can be sent to your provider as well.   To learn more about what you can do with MyChart, go to [email or web address].    Your next appointment:   2 year(s)  You will receive a reminder letter in the mail two months in advance. If you don't receive a letter, please call our office to schedule the follow-up  appointment.  The format for your next appointment:   In Person  Provider:   Buford Dresser, MD     Signed, Buford Dresser, MD PhD 08/10/2020 6:27 PM    [place]

## 2020-09-22 ENCOUNTER — Ambulatory Visit (INDEPENDENT_AMBULATORY_CARE_PROVIDER_SITE_OTHER): Payer: BC Managed Care – PPO | Admitting: Family Medicine

## 2020-09-22 ENCOUNTER — Encounter: Payer: Self-pay | Admitting: Family Medicine

## 2020-09-22 ENCOUNTER — Other Ambulatory Visit: Payer: Self-pay

## 2020-09-22 VITALS — BP 112/80 | HR 98 | Ht 65.0 in | Wt 148.4 lb

## 2020-09-22 DIAGNOSIS — R221 Localized swelling, mass and lump, neck: Secondary | ICD-10-CM

## 2020-09-22 DIAGNOSIS — E041 Nontoxic single thyroid nodule: Secondary | ICD-10-CM | POA: Diagnosis not present

## 2020-09-22 DIAGNOSIS — R42 Dizziness and giddiness: Secondary | ICD-10-CM

## 2020-09-22 MED ORDER — AMOXICILLIN-POT CLAVULANATE 875-125 MG PO TABS: 1.0000 | ORAL_TABLET | Freq: Two times a day (BID) | ORAL | 0 refills | Status: DC

## 2020-09-22 NOTE — Assessment & Plan Note (Signed)
Discovered incidentally by patient. -Follow-up TSH and thyroid ultrasound

## 2020-09-22 NOTE — Patient Instructions (Signed)
Dizziness: I think your dizziness is likely from an ear infection. I have prescribed you a 7-day course of antibiotics. If this does not improve in the next 2 weeks, call the clinic and leave me a message, I will probably send you to vestibular rehab for this dizziness.  Neck nodule: It is possible this is a nodule on your thyroid. We will check a thyroid level today and your blood work. I have also put in an order to get an ultrasound of your thyroid.

## 2020-09-22 NOTE — Progress Notes (Signed)
    SUBJECTIVE:   CHIEF COMPLAINT / HPI:   [place] Ms. Natividad reports that she first started to have episodes of a room spinning sensation 4 days ago.  This sensation seems to come and go and not be associated with any specific activity.  Her vertigo seemed to worsen briefly 3 days ago and improve again slightly.  She specifically notes that it feels as though the room is spinning around her.  Does not feel like lightheadedness.  She did notice one instance of this being exacerbated by rolling from side to side in bed, apart from that episode, she did not notice any other exacerbating features.  She has never had an issue like this before.  She did also note a mild headache starting 3 days ago which feels like a typical tension type headache covering both sides of her head without intensity, vision changes or aura.  She has not had any difficulty with ambulation or activities of daily living.  Thyroid nodule Noticed in the past several days since she has not felt well and she has been more attentive to her body.  She noted a small lump at the base of her neck on the left side.  She would like to know if this needs any particular work-up.  PERTINENT  PMH / PSH: Noncontributory  OBJECTIVE:   BP 112/80   Pulse 98   Ht 5\' 5"  (1.651 m)   Wt 148 lb 6.4 oz (67.3 kg)   LMP 04/22/2016   SpO2 100%   BMI 24.70 kg/m    General: Alert and cooperative and appears to be in no acute distress.  Able to walk around the exam room comfortably and step up onto the exam table without issue. HEENT: Pupils equal, round and reactive to light.  Extraocular muscles intact.  Purulence noted behind the right tympanic membrane.  Left tympanic membrane normal-appearing.  1.0 cm palpable nodule over her left, anterior neck just above her left sternoclavicular joint.  Nontender to palpation.  No skin changes. Cardio: Normal S1 and S2, no S3 or S4. Rhythm is regular. No murmurs or rubs.   Pulm: Clear to auscultation  bilaterally, no crackles, wheezing, or diminished breath sounds. Normal respiratory effort Abdomen: Bowel sounds normal. Abdomen soft and non-tender.  Neuro: Epley maneuver showed no evidence of nystagmus bilaterally.  She did have a brief episode of nystagmus when sitting up following an Epley maneuver toward the right side.  This was not reproducible.  ASSESSMENT/PLAN:   Vertigo Symptoms consistent with vertigo.  Episodic nature.  No evidence of focal deficits on exam concerning for CVA.  Labyrinthitis was also considered though unlikely due to the episodic presentation as opposed to continuous symptoms.  Physical exam did demonstrate a purulent effusion behind the right tympanic membrane.  We will initially treat her for a middle ear infection and monitor for improvement.  BPPV is also possibility.  If her symptoms do not improve in the next 1-2 weeks, I will send her to vestibular rehab for further assessment and treatment of her vertiginous symptoms. -Amoxicillin twice daily 7 days -Consider vestibular rehab for persistent symptoms -May also consider ENT consult for persistent symptoms  Thyroid nodule Discovered incidentally by patient. -Follow-up TSH and thyroid ultrasound     Matilde Haymaker, MD [place]

## 2020-09-22 NOTE — Assessment & Plan Note (Signed)
Symptoms consistent with vertigo.  Episodic nature.  No evidence of focal deficits on exam concerning for CVA.  Labyrinthitis was also considered though unlikely due to the episodic presentation as opposed to continuous symptoms.  Physical exam did demonstrate a purulent effusion behind the right tympanic membrane.  We will initially treat her for a middle ear infection and monitor for improvement.  BPPV is also possibility.  If her symptoms do not improve in the next 1-2 weeks, I will send her to vestibular rehab for further assessment and treatment of her vertiginous symptoms. -Amoxicillin twice daily 7 days -Consider vestibular rehab for persistent symptoms -May also consider ENT consult for persistent symptoms

## 2020-09-23 LAB — TSH: TSH: 0.647 u[IU]/mL (ref 0.450–4.500)

## 2020-09-24 ENCOUNTER — Ambulatory Visit
Admission: RE | Admit: 2020-09-24 | Discharge: 2020-09-24 | Disposition: A | Discharge status: Home / Self Care | Payer: BC Managed Care – PPO | Source: Ambulatory Visit | Attending: Family Medicine | Admitting: Family Medicine

## 2020-09-24 ENCOUNTER — Other Ambulatory Visit: Payer: Self-pay | Admitting: Family Medicine

## 2020-09-24 DIAGNOSIS — R221 Localized swelling, mass and lump, neck: Secondary | ICD-10-CM

## 2020-09-24 DIAGNOSIS — E041 Nontoxic single thyroid nodule: Secondary | ICD-10-CM

## 2020-09-24 DIAGNOSIS — R42 Dizziness and giddiness: Secondary | ICD-10-CM

## 2020-09-24 NOTE — Progress Notes (Unsigned)
Ms. Cheryl Carlson was called to inform her of her recent thyroid ultrasound which revealed a 4.5 cm nodule and recommended fine-needle biopsy.  All of her questions regarding the thyroid nodule were answered and she was agreeable with moving forward with a fine-needle biopsy. -FNA ordered -Message sent to [facility] team  While on the phone, Ms. Cheryl Carlson noted she continued to have dizziness and headache.  Her dizziness/vertigo symptoms seem to be worse when she bends over and stands up again.  She notes that her symptoms seem to be more or less persistent since her last visit and have not improved.  Does not seem that the antibiotics for possible ear infection have made much of a difference.  She was instructed to continue her antibiotics and that I would put in a referral to vestibular rehab for her to be assessed and treated for her vertigo symptoms. -Placed order for PT vestibular rehab

## 2020-09-25 NOTE — Progress Notes (Signed)
Called [facility] to schedule appt. [place] from [place] stated that she going to call the pt to schedule that appt. Cause she has questions she need to ask pt. Salvatore Marvel, CMA

## 2020-10-02 ENCOUNTER — Other Ambulatory Visit (HOSPITAL_COMMUNITY)
Admission: RE | Admit: 2020-10-02 | Discharge: 2020-10-02 | Disposition: A | Discharge status: Home / Self Care | Payer: BC Managed Care – PPO | Source: Ambulatory Visit | Attending: Family Medicine | Admitting: Family Medicine

## 2020-10-02 ENCOUNTER — Ambulatory Visit
Admission: RE | Admit: 2020-10-02 | Discharge: 2020-10-02 | Disposition: A | Discharge status: Home / Self Care | Payer: BC Managed Care – PPO | Source: Ambulatory Visit | Attending: Family Medicine | Admitting: Family Medicine

## 2020-10-02 DIAGNOSIS — E041 Nontoxic single thyroid nodule: Secondary | ICD-10-CM

## 2020-10-02 DIAGNOSIS — D440 Neoplasm of uncertain behavior of thyroid gland: Secondary | ICD-10-CM | POA: Diagnosis not present

## 2020-10-05 LAB — CYTOLOGY - NON PAP

## 2020-10-08 ENCOUNTER — Telehealth: Payer: Self-pay

## 2020-10-08 NOTE — Telephone Encounter (Signed)
Patient calls nurse line requesting results from thyroid biopsy from 10/02/20.   Forwarding to PCP and ordering provider.   Talbot Grumbling, RN

## 2020-10-09 ENCOUNTER — Other Ambulatory Visit: Payer: Self-pay | Admitting: Family Medicine

## 2020-10-09 DIAGNOSIS — E041 Nontoxic single thyroid nodule: Secondary | ICD-10-CM

## 2020-10-09 NOTE — Progress Notes (Unsigned)
Cheryl Carlson was called to discuss the results from her recent thyroid biopsy.  She was informed that the pathologist classified her biopsy as a Bethesda category 1 which means that the sample was not quite adequate.  We discussed the options for next steps which included: -Repeat FNA -Referral to surgery to discuss resection -Referral to endocrine From our discussion, Cheryl Carlson elected to be referred to endocrinology. -Placed ambulatory referral to endocrinology -She was encouraged to return to clinic if she began experiencing any new, concerning symptoms  Matilde Haymaker, MD

## 2020-10-16 ENCOUNTER — Telehealth: Payer: Self-pay

## 2020-10-16 NOTE — Telephone Encounter (Signed)
Patient calls nurse line requesting to speak with Dr. Pilar Plate regarding follow up from [place] on 1/11. Patient reports that she took all of the prescribed antibiotic (Augmentin) for the inner ear infection. Patient continues to have pain in ear and tooth on right side.   Advised patient that she would likely need follow up appointment for further assessment. Patient currently denies and would like to wait to hear back from Dr. Pilar Plate.   Talbot Grumbling, RN

## 2020-10-19 NOTE — Telephone Encounter (Signed)
Her jaw and tooth pain seems to have resolved.  She was grateful for the call.  No further interventions needed at this time.  She is advised that tooth and jaw pain could be related to TMJ dysfunction and that NSAIDs can be helpful.

## 2020-11-17 ENCOUNTER — Other Ambulatory Visit: Payer: Self-pay

## 2020-11-19 ENCOUNTER — Other Ambulatory Visit (HOSPITAL_COMMUNITY)
Admission: RE | Admit: 2020-11-19 | Discharge: 2020-11-19 | Disposition: A | Discharge status: Home / Self Care | Payer: BC Managed Care – PPO | Source: Ambulatory Visit | Attending: Endocrinology | Admitting: Endocrinology

## 2020-11-19 ENCOUNTER — Ambulatory Visit (INDEPENDENT_AMBULATORY_CARE_PROVIDER_SITE_OTHER): Payer: BC Managed Care – PPO | Admitting: Endocrinology

## 2020-11-19 ENCOUNTER — Encounter: Payer: Self-pay | Admitting: Endocrinology

## 2020-11-19 ENCOUNTER — Other Ambulatory Visit: Payer: Self-pay

## 2020-11-19 VITALS — BP 144/96 | HR 83 | Ht 64.0 in | Wt 144.6 lb

## 2020-11-19 DIAGNOSIS — E041 Nontoxic single thyroid nodule: Secondary | ICD-10-CM | POA: Insufficient documentation

## 2020-11-19 NOTE — Progress Notes (Signed)
Subjective:    Patient ID: Cheryl Carlson, female    DOB: 1970/12/26, 50 y.o.   MRN: [id number]  HPI Pt is referred by Dr Gwendlyn Deutscher, for nodular thyroid.  Pt was noted to have a thyroid nodule in 1/22.  she is unaware of ever having had thyroid problems in the past.  she has no h/o XRT or surgery to the neck.   Past Medical History:  Diagnosis Date  . GERD (gastroesophageal reflux disease)   . Hypertension   . Mitral valve prolapse     Past Surgical History:  Procedure Laterality Date  . APPENDECTOMY    . UPPER GASTROINTESTINAL ENDOSCOPY    . uterine tumor removal  2011    Social History   Socioeconomic History  . Marital status: Legally Separated    Spouse name: Not on file  . Number of children: 1  . Years of education: Not on file  . Highest education level: Not on file  Occupational History  . Occupation: Cusodial  Tobacco Use  . Smoking status: Current Every Day Smoker    Packs/day: 0.50    Types: Cigarettes  . Smokeless tobacco: Never Used  Vaping Use  . Vaping Use: Never used  Substance and Sexual Activity  . Alcohol use: Yes    Comment: socially  . Drug use: No  . Sexual activity: Not on file  Other Topics Concern  . Not on file  Social History Narrative  . Not on file   Social Determinants of Health   Financial Resource Strain: Not on file  Food Insecurity: Not on file  Transportation Needs: Not on file  Physical Activity: Not on file  Stress: Not on file  Social Connections: Not on file  Intimate Partner Violence: Not on file    Current Outpatient Medications on File Prior to Visit  Medication Sig Dispense Refill  . amLODipine (NORVASC) 10 MG tablet TAKE 1 TABLET(10 MG) BY MOUTH DAILY 90 tablet 3  . amoxicillin-clavulanate (AUGMENTIN) 875-125 MG tablet Take 1 tablet by mouth 2 (two) times daily. 14 tablet 0  . Ascorbic Acid (VITAMIN C PO) Take by mouth daily.    Marland Kitchen aspirin EC 81 MG tablet Take 81 mg by mouth daily.    . baclofen (LIORESAL) 10  MG tablet TAKE 1 TABLET(10 MG) BY MOUTH THREE TIMES DAILY AS NEEDED FOR MUSCLE SPASM 90 tablet 0  . Cholecalciferol (VITAMIN D3 PO) Take by mouth daily.    Marland Kitchen ELDERBERRY PO Take by mouth daily.    . Multiple Vitamin (MULTIVITAMIN ADULT PO) Take by mouth. daily    . Multiple Vitamins-Minerals (HAIR/SKIN/NAILS) TABS Take 1 tablet by mouth daily.     . naproxen (NAPROSYN) 500 MG tablet Take 1 tablet two times per day as needed for pain 60 tablet 0  . NON FORMULARY Sea moss daily    . NON FORMULARY Super beets daily    . omeprazole (PRILOSEC) 40 MG capsule TAKE 1 CAPSULE(40 MG) BY MOUTH EVERY MORNING 30 MINUTES BEFORE BREAKFAST 30 capsule 11  . TURMERIC PO Take by mouth daily.     No current facility-administered medications on file prior to visit.    No Known Allergies  Family History  Problem Relation Age of Onset  . Heart disease Mother   . Hypertension Mother   . Diabetes Mother   . Hypertension Father   . Heart attack Brother 83  . Diabetes Maternal Grandmother   . Heart attack Brother   . Stomach cancer  Neg Hx   . Esophageal cancer Neg Hx   . Colon cancer Neg Hx   . Rectal cancer Neg Hx   . Thyroid disease Neg Hx     BP (!) 144/96 (BP Location: Right Arm, Patient Position: Sitting, Cuff Size: Normal)   Pulse 83   Ht 5\' 4"  (1.626 m)   Wt 144 lb 9.6 oz (65.6 kg)   LMP 04/22/2016   SpO2 97%   BMI 24.82 kg/m     Review of Systems Denies hoarseness, neck pain, sob, diarrhea, and flushing.      Objective:   Physical Exam VITAL SIGNS:  See vs page GENERAL: no distress NECK: Left thyroid nodule is easily palpable.  No palpable lymphadenopathy at the anterior neck.    [place]: Single large nodule in the left inferior thyroid measuring up to 4.8 cm which meets criteria (TI-RADS category 3) for tissue sampling.    Cytology: Bethesda category I  Lab Results  Component Value Date   TSH 0.647 09/22/2020   I have reviewed outside records, and summarized: Pt was noted to have  thyroid nodule, and referred here.  She was also noted to have vertigenous sxs, and ref ENT was considered.    thyroid needle bx: Location: left thyroid consent obtained, signed form on chart The area is first sprayed with cooling agent local: xylocaine 2%, with epinephrine prep: alcohol pad 4 bxs are done with 88T needles no complications     Assessment & Plan:  Thyroid nodule, new to me, uncertain etiology and prognosis.  We discussed.  Pt requested to recheck bx today, which I have done.   Patient Instructions  We'll let you know about the results. If as expected, no cancer is found, Please come back for a follow-up appointment in 6 months.

## 2020-11-19 NOTE — Patient Instructions (Signed)
We'll let you know about the results. If as expected, no cancer is found, Please come back for a follow-up appointment in 6 months.

## 2020-11-23 LAB — CYTOLOGY - NON PAP

## 2021-01-08 ENCOUNTER — Other Ambulatory Visit: Payer: Self-pay | Admitting: Internal Medicine

## 2021-02-25 ENCOUNTER — Encounter: Payer: Self-pay | Admitting: Podiatry

## 2021-02-25 ENCOUNTER — Other Ambulatory Visit: Payer: Self-pay

## 2021-02-25 ENCOUNTER — Ambulatory Visit (INDEPENDENT_AMBULATORY_CARE_PROVIDER_SITE_OTHER): Payer: BC Managed Care – PPO | Admitting: Podiatry

## 2021-02-25 DIAGNOSIS — B070 Plantar wart: Secondary | ICD-10-CM | POA: Diagnosis not present

## 2021-02-25 NOTE — Progress Notes (Signed)
Subjective:   Patient ID: Cheryl Carlson, female   DOB: 50 y.o.   MRN: [id number]   HPI Patient presents with a painful lesion of the plantar left foot stating that its been sore and making it hard for her to walk.  States its been there for approximate 3 months and is making it difficult for her to be ambulatory and she did lose her husband and was on her feet quite a bit.  Patient smokes 1/2 pack cigarettes per day   Review of Systems  All other systems reviewed and are negative.      Objective:  Physical Exam Vitals and nursing note reviewed.  Constitutional:      Appearance: She is well-developed.  Pulmonary:     Effort: Pulmonary effort is normal.  Musculoskeletal:        General: Normal range of motion.  Skin:    General: Skin is warm.  Neurological:     Mental Status: She is alert.    Neurovascular status found to be intact muscle strength found to be adequate patient brought her heart a lesion submetatarsal left that upon debridement shows pinpoint bleeding and is painful when pressed.  Patient also has pain to lateral pressure has good digital perfusion well oriented x3     Assessment:  Verruca plantaris plantar aspect left with pain     Plan:  H&P educated her on condition discussed overall condition also she has nail disease and I have recommended debridement which was accomplished today and I went ahead debrided the lesion flushed it out and applied salicylic acid with sterile dressing and instructed on other treatments we may need to do in future.  Patient will be seen back depending on response

## 2021-03-18 ENCOUNTER — Ambulatory Visit (INDEPENDENT_AMBULATORY_CARE_PROVIDER_SITE_OTHER): Payer: BC Managed Care – PPO | Admitting: Family Medicine

## 2021-03-18 ENCOUNTER — Encounter: Payer: Self-pay | Admitting: Family Medicine

## 2021-03-18 ENCOUNTER — Other Ambulatory Visit: Payer: Self-pay

## 2021-03-18 VITALS — HR 82 | Ht 64.0 in | Wt 144.1 lb

## 2021-03-18 DIAGNOSIS — I10 Essential (primary) hypertension: Secondary | ICD-10-CM

## 2021-03-18 DIAGNOSIS — Z131 Encounter for screening for diabetes mellitus: Secondary | ICD-10-CM | POA: Diagnosis not present

## 2021-03-18 DIAGNOSIS — M533 Sacrococcygeal disorders, not elsewhere classified: Secondary | ICD-10-CM

## 2021-03-18 DIAGNOSIS — G8929 Other chronic pain: Secondary | ICD-10-CM

## 2021-03-18 DIAGNOSIS — Z13220 Encounter for screening for lipoid disorders: Secondary | ICD-10-CM

## 2021-03-18 DIAGNOSIS — Z0000 Encounter for general adult medical examination without abnormal findings: Secondary | ICD-10-CM | POA: Diagnosis not present

## 2021-03-18 DIAGNOSIS — Z1231 Encounter for screening mammogram for malignant neoplasm of breast: Secondary | ICD-10-CM

## 2021-03-18 DIAGNOSIS — E041 Nontoxic single thyroid nodule: Secondary | ICD-10-CM | POA: Diagnosis not present

## 2021-03-18 DIAGNOSIS — Z1159 Encounter for screening for other viral diseases: Secondary | ICD-10-CM

## 2021-03-18 DIAGNOSIS — Z720 Tobacco use: Secondary | ICD-10-CM

## 2021-03-18 LAB — POCT GLYCOSYLATED HEMOGLOBIN (HGB A1C): Hemoglobin A1C: 5.7 % — AB (ref 4.0–5.6)

## 2021-03-18 MED ORDER — DICLOFENAC SODIUM 1 % EX GEL: 4.0000 g | Freq: Four times a day (QID) | CUTANEOUS | 1 refills | Status: DC

## 2021-03-18 NOTE — Patient Instructions (Addendum)
It was a pleasure to see you today!  We will get some labs today.  If they are abnormal or we need to do something about them, I will call you.  If they are normal, I will send you a message on MyChart (if it is active) or a letter in the mail.  If you don't hear from [place] in 2 weeks, please call the office  [phone]. Follow up in 1 month for a pap smear and we will recheck your blood pressure To get free supplies for smoking cessation: 1-800-QUIT-NOW For mammogram, [facility] to schedule this year For your back, I want you to do gentle stretching exercises of your SI joint. You can use tylenol or ibuprofen over the counter. You can also use things like voltaren gel on the area. If your pain worsens or does not improve, follow up. [email or web address]://[email or web address]/[email or web address]/[email or web address]/strengthening-exercises-[email or web address]-joint-pain-relief   Be Well,  Dr. Chauncey Reading

## 2021-03-18 NOTE — Progress Notes (Signed)
SUBJECTIVE:   Chief compliant/HPI: annual examination  Cheryl Carlson is a 50 y.o. who presents today for an annual exam.   Patient has been under a lot of stress lately as her mother has dementia and was recently hospitalized for CAP. Patient's husband also passed away in 12-27-2022. She has no signs of depression, but she is not ready to quit smoking with her current stress level.  Reviewed thyroid nodule history, FNA in January 2022 benign Bethesda II category, no follow up recommendations per radiology.  Back pain: Patient notes that for the last week or so she has had tension and pain in her right low back. She reports it feels tight and stiff. She has tried OTC analgesia with tylenol/ibuprofen.  HTN: patient reports that she did not take her medicine yet today and she smoked two cigarettes before coming to appointment. She denies any symptoms.  History tabs reviewed and updated.   Review of systems form reviewed and notable for pain on right low back (see above).  OBJECTIVE:   Pulse 82   Ht 5\' 4"  (1.626 m)   Wt 144 lb 2 oz (65.4 kg)   LMP 04/22/2016   SpO2 97%   BMI 24.74 kg/m   Nursing note and vitals reviewed GEN: age-appropriate, AAW, resting comfortably in chair, NAD, WNWD HEENT: NCAT. PERRLA. Sclera without injection or icterus. MMM.  Neck: Supple. No LAD, thyroid smooth and not palpable Cardiac: Regular rate and rhythm. Normal S1/S2. No murmurs, rubs, or gallops appreciated. 2+ radial pulses. Lungs: Clear bilaterally to ascultation. No increased WOB, no accessory muscle usage. No w/r/r. Lumbar spine: - Inspection: no gross deformity or asymmetry, swelling or ecchymosis. No skin changes - Palpation: mild TTP over the right SI joint. No TTP over the spinous processes, or paraspinal muscles - ROM: full active ROM of the lumbar spine in flexion and extension without pain - Strength: 5/5 strength of lower extremity in L4-S1 nerve root distributions b/l - Neuro:  sensation intact in the L4-S1 nerve root distribution b/l, 2+ L4 and S1 reflexes - Straight Leg Raise test: NEG - Negative trendelenberg gait Neuro: Alert and at baseline Ext: no edema Psych: Pleasant and appropriate  ASSESSMENT/PLAN:   Thyroid nodule No further follow up indicated at this time. Will monitor for symptoms or changing physical exam regularly. Reminded patient to note any changes.  Essential hypertension, benign Chronic, possibly uncontrolled. 130/110. Asymptomatic Continue amlodipine 10 mg daily. Will obtain CMP today. Follow up 1 month for recheck.  Counseled on compliance and smoking.  Tobacco abuse Patient is in the pre-contemplative stage of change. Discussed importance of smoking cessation, patient can see the long term effects with her mother's COPD. Gave her Quit Line number in case she changes her mind and also so she can get resources for her mother.  Pain of right sacroiliac joint Phyiscal exam c/w SI joint irritation. Recommend conservative tx with ice/heat, voltaren gel, and gentle stretching/strengthening exercises. Patient denies any red flag symptoms. Follow up if no improvement in 1 month. Will recommend formal PT at that time, can consider imaging then as well.    Annual Examination  See AVS for age appropriate recommendations  PHQ score low, reviewed and discussed.  BP reviewed and at not at goal. Will follow up in 1 month.  Asked about intimate partner violence and resources given as appropriate   Considered the following items based upon USPSTF recommendations: Diabetes screening: discussed and ordered Screening for elevated cholesterol: discussed and ordered  HIV testing: discussed and ordered Hepatitis C: discussed and ordered Hepatitis B:  not ordered low risk Syphilis if at high risk:  not at high risk, not ordered GC/CT not at high risk and not ordered. Osteoporosis screening not ordered as patient has not yet gone through menopause, no  elevated risk with no h/o fractures. Reviewed risk factors for latent tuberculosis and not indicated  Cervical cancer screening:  discussed, patient prefers to do it 1 month from now Breast cancer screening: discussed potential benefits, risks including overdiagnosis and biopsy, elected proceed with mammogram Colorectal cancer screening: up to date on screening for CRC. Lung cancer screening: declined. Will discuss regularly.   Follow up in 1 month for HTN f/u and pap or sooner if indicated.   Gladys Damme, MD [place]

## 2021-03-19 LAB — COMPREHENSIVE METABOLIC PANEL
ALT: 9 IU/L (ref 0–32)
AST: 12 IU/L (ref 0–40)
Albumin/Globulin Ratio: 2.1 (ref 1.2–2.2)
Albumin: 4.8 g/dL (ref 3.8–4.9)
Alkaline Phosphatase: 72 IU/L (ref 44–121)
BUN/Creatinine Ratio: 12 (ref 9–23)
BUN: 12 mg/dL (ref 6–24)
Bilirubin Total: <0.2 mg/dL (ref 0.0–1.2)
CO2: 25 mmol/L (ref 20–29)
Calcium: 9.8 mg/dL (ref 8.7–10.2)
Chloride: 102 mmol/L (ref 96–106)
Creatinine, Ser: 0.99 mg/dL (ref 0.57–1.00)
Globulin, Total: 2.3 g/dL (ref 1.5–4.5)
Glucose: 83 mg/dL (ref 65–99)
Potassium: 4.4 mmol/L (ref 3.5–5.2)
Sodium: 142 mmol/L (ref 134–144)
Total Protein: 7.1 g/dL (ref 6.0–8.5)
eGFR: 69 mL/min/{1.73_m2} (ref >59)

## 2021-03-19 LAB — LIPID PANEL
Chol/HDL Ratio: 3.8 ratio (ref 0.0–4.4)
Cholesterol, Total: 196 mg/dL (ref 100–199)
HDL: 52 mg/dL (ref >39)
LDL Chol Calc (NIH): 112 mg/dL — ABNORMAL HIGH (ref 0–99)
Triglycerides: 181 mg/dL — ABNORMAL HIGH (ref 0–149)
VLDL Cholesterol Cal: 32 mg/dL (ref 5–40)

## 2021-03-19 LAB — HCV INTERPRETATION

## 2021-03-19 LAB — HCV AB W REFLEX TO QUANT PCR: HCV Ab: 0.2 s/co ratio (ref 0.0–0.9)

## 2021-03-20 DIAGNOSIS — M533 Sacrococcygeal disorders, not elsewhere classified: Secondary | ICD-10-CM | POA: Insufficient documentation

## 2021-03-20 NOTE — Assessment & Plan Note (Signed)
No further follow up indicated at this time. Will monitor for symptoms or changing physical exam regularly. Reminded patient to note any changes.

## 2021-03-20 NOTE — Assessment & Plan Note (Signed)
Patient is in the pre-contemplative stage of change. Discussed importance of smoking cessation, patient can see the long term effects with her mother's COPD. Gave her Quit Line number in case she changes her mind and also so she can get resources for her mother.

## 2021-03-20 NOTE — Assessment & Plan Note (Signed)
Chronic, possibly uncontrolled. 130/110. Asymptomatic Continue amlodipine 10 mg daily. Will obtain CMP today. Follow up 1 month for recheck.  Counseled on compliance and smoking.

## 2021-03-20 NOTE — Assessment & Plan Note (Signed)
Phyiscal exam c/w SI joint irritation. Recommend conservative tx with ice/heat, voltaren gel, and gentle stretching/strengthening exercises. Patient denies any red flag symptoms. Follow up if no improvement in 1 month. Will recommend formal PT at that time, can consider imaging then as well.

## 2021-03-21 NOTE — Addendum Note (Signed)
Addended by: Owens Shark, Anida Deol on: 03/21/2021 07:58 AM   Modules accepted: Level of Service

## 2021-03-22 ENCOUNTER — Telehealth: Payer: Self-pay | Admitting: Family Medicine

## 2021-03-22 DIAGNOSIS — E782 Mixed hyperlipidemia: Secondary | ICD-10-CM

## 2021-03-22 MED ORDER — ROSUVASTATIN CALCIUM 20 MG PO TABS: 20.0000 mg | ORAL_TABLET | Freq: Every day | ORAL | 3 refills | Status: DC

## 2021-03-22 NOTE — Telephone Encounter (Signed)
Called patient's phone to discuss results. Recommend crestor for ASCVD risk of 12%. Patient agreeable. Discussed SE's. Will follow up at appt in 1 month.  Gladys Damme, MD [facility], PGY-3

## 2021-03-25 ENCOUNTER — Other Ambulatory Visit: Payer: Self-pay | Admitting: Internal Medicine

## 2021-03-26 ENCOUNTER — Other Ambulatory Visit: Payer: Self-pay | Admitting: Internal Medicine

## 2021-03-31 ENCOUNTER — Other Ambulatory Visit: Payer: Self-pay | Admitting: Internal Medicine

## 2021-04-12 ENCOUNTER — Other Ambulatory Visit: Payer: Self-pay

## 2021-04-12 DIAGNOSIS — M25512 Pain in left shoulder: Secondary | ICD-10-CM

## 2021-04-12 DIAGNOSIS — G8929 Other chronic pain: Secondary | ICD-10-CM

## 2021-04-12 MED ORDER — BACLOFEN 10 MG PO TABS: ORAL_TABLET | ORAL | 0 refills | Status: DC

## 2021-04-12 NOTE — Telephone Encounter (Signed)
Patient calls nurse line stating that she is having a lot of pain in back and hips. Patient just went to urgent care, however, did not stay due to 1.5 hour wait.   Patient is requesting refill of baclofen to [facility] on [address].   Please advise.   Talbot Grumbling, RN

## 2021-05-20 ENCOUNTER — Other Ambulatory Visit: Payer: Self-pay | Admitting: Family Medicine

## 2021-05-20 DIAGNOSIS — Z1231 Encounter for screening mammogram for malignant neoplasm of breast: Secondary | ICD-10-CM

## 2021-05-27 ENCOUNTER — Ambulatory Visit: Payer: BC Managed Care – PPO | Admitting: Endocrinology

## 2021-06-16 ENCOUNTER — Other Ambulatory Visit (HOSPITAL_COMMUNITY)
Admission: RE | Admit: 2021-06-16 | Discharge: 2021-06-16 | Disposition: A | Discharge status: Home / Self Care | Payer: BC Managed Care – PPO | Source: Ambulatory Visit | Attending: Family Medicine | Admitting: Family Medicine

## 2021-06-16 ENCOUNTER — Ambulatory Visit (INDEPENDENT_AMBULATORY_CARE_PROVIDER_SITE_OTHER): Payer: BC Managed Care – PPO | Admitting: Family Medicine

## 2021-06-16 ENCOUNTER — Other Ambulatory Visit: Payer: Self-pay

## 2021-06-16 ENCOUNTER — Encounter: Payer: Self-pay | Admitting: Family Medicine

## 2021-06-16 VITALS — BP 127/87 | HR 82 | Ht 62.0 in | Wt 142.2 lb

## 2021-06-16 DIAGNOSIS — N898 Other specified noninflammatory disorders of vagina: Secondary | ICD-10-CM

## 2021-06-16 DIAGNOSIS — Z113 Encounter for screening for infections with a predominantly sexual mode of transmission: Secondary | ICD-10-CM

## 2021-06-16 DIAGNOSIS — E782 Mixed hyperlipidemia: Secondary | ICD-10-CM | POA: Diagnosis not present

## 2021-06-16 DIAGNOSIS — Z124 Encounter for screening for malignant neoplasm of cervix: Secondary | ICD-10-CM | POA: Insufficient documentation

## 2021-06-16 DIAGNOSIS — Z114 Encounter for screening for human immunodeficiency virus [HIV]: Secondary | ICD-10-CM

## 2021-06-16 DIAGNOSIS — E041 Nontoxic single thyroid nodule: Secondary | ICD-10-CM | POA: Diagnosis not present

## 2021-06-16 DIAGNOSIS — Z0000 Encounter for general adult medical examination without abnormal findings: Secondary | ICD-10-CM | POA: Diagnosis not present

## 2021-06-16 DIAGNOSIS — E785 Hyperlipidemia, unspecified: Secondary | ICD-10-CM | POA: Insufficient documentation

## 2021-06-16 DIAGNOSIS — R7303 Prediabetes: Secondary | ICD-10-CM | POA: Insufficient documentation

## 2021-06-16 DIAGNOSIS — I10 Essential (primary) hypertension: Secondary | ICD-10-CM

## 2021-06-16 LAB — POCT WET PREP (WET MOUNT)
Clue Cells Wet Prep Whiff POC: NEGATIVE
Trichomonas Wet Prep HPF POC: ABSENT

## 2021-06-16 LAB — POCT GLYCOSYLATED HEMOGLOBIN (HGB A1C): Hemoglobin A1C: 5.5 % (ref 4.0–5.6)

## 2021-06-16 NOTE — Patient Instructions (Addendum)
It was a pleasure to see you today!  We will get some labs today.  If they are abnormal or we need to do something about them, I will call you.  If they are normal, I will send you a message on MyChart (if it is active) or a letter in the mail.  If you don't hear from [place] in 2 weeks, please call the office  [phone]. For your thyroid nodule: I have placed a referral for Dr. Celine Ahr, she does thyroid surgery in [place]. You should receive a phone call in 1-2 weeks to schedule this appointment. If for any reason you do not receive a phone call or need more help scheduling this appointment, please call our office at [phone]. Great work on your blood pressure! Keep up the good work. Follow up in 6 months to check in on pre-diabetes, cholesterol, etc. If new or worsening problems, return sooner.   Be Well,  Dr. Chauncey Reading

## 2021-06-16 NOTE — Progress Notes (Signed)
    SUBJECTIVE:   CHIEF COMPLAINT / HPI: chronic problem f/u  HTN: BP is at goal today 127/87. She is currently taking amlodipine 10 mg qd.  No symptoms.  HLD: ASCVD risk 12.6% started on crestor 20 mg. Last LDL in July elevated to 121. Patient reports medication adherence. Will check direct LDL today.  HC maintenance: recommend pap smear, patient opts for routine STI testing as well. Counseled on flu, shingles, and COVID vaccines. Mammogram scheduled for next week. Discussed lung cancer screening again given smoking history.  Left thyroid nodule: patient had [place] and FNA of nodule in March 2022 with Bethesda II category (benign nodule) found on cytology. She has follow up with endocrinology later this month for 6 month visit. Today she reports discomfort with swallowing, she has noticed that the nodules gotten larger.  She is still able to swallow, but has pain with it.  She specifically would like to see a thyroid surgeon.  Whereas before I could not noticed thyroid nodule by eyesight, today I can see thyroid nodule from doorway.  PERTINENT  PMH / PSH: HLD, tobacco use, benign thyroid nodule  OBJECTIVE:   BP 127/87   Pulse 82   Ht 5\' 2"  (1.575 m)   Wt 142 lb 3.2 oz (64.5 kg)   LMP 04/22/2016   SpO2 100%   BMI 26.01 kg/m   Nursing note and vitals reviewed GEN: Age-appropriate, AAW, resting comfortably in chair, NAD, WNWD HEENT: NCAT. PERRLA. Sclera without injection or icterus. MMM.  Clear oropharynx. Neck: Supple.  No LAD, left anterior nodule of thyroid increased in size to approximately 3 cm Cardiac: Regular rate and rhythm. Normal S1/S2. No murmurs, rubs, or gallops appreciated. 2+ radial pulses. Lungs: Clear bilaterally to ascultation. No increased WOB, no accessory muscle usage. No w/r/r. PELVIC:  Normal appearing external female genitalia, normal vaginal epithelium, no abnormal discharge. Normal appearing cervix.  Neuro: AOx3  Ext: no edema Psych: Pleasant and appropriate    ASSESSMENT/PLAN:   Essential hypertension, benign Chronic, well controlled, asymptomatic.  Continue amlodipine 10 mg.  Thyroid nodule Thyroid nodule has increased in size, is now grossly observable from doorway.  Patient also reports dysphagia.  Will refer patient to Dr. Celine Ahr at [facility] for consult and evaluation.  Healthcare maintenance Pap smear performed today, also obtain wet prep, HIV, RPR, GC. Mammogram scheduled for next week.   Hyperlipidemia The 10-year ASCVD risk score (Arnett DK, et al., 2019) is: 8%   Values used to calculate the score:     Age: 50 years     Sex: Female     Is Non-Hispanic African American: Yes     Diabetic: No     Tobacco smoker: Yes     Systolic Blood Pressure: 993 mmHg     Is BP treated: Yes     HDL Cholesterol: 52 mg/dL     Total Cholesterol: 196 mg/dL Patient is presently on a statin, tolerating well.  We will check LDL today.  LDL today.  Prediabetes Patient previously diagnosed with prediabetes due to A1c of 5.7%.  She notes that she has been working hard on walking, but has had some and dietary indiscretions.  A1c rechecked today 5.5%.  Congratulated patient on improvement, keep up exercise.     Cheryl Damme, MD [place]

## 2021-06-16 NOTE — Assessment & Plan Note (Signed)
Thyroid nodule has increased in size, is now grossly observable from doorway.  Patient also reports dysphagia.  Will refer patient to Dr. Celine Ahr at [facility] for consult and evaluation.

## 2021-06-16 NOTE — Assessment & Plan Note (Signed)
Pap smear performed today, also obtain wet prep, HIV, RPR, GC. Mammogram scheduled for next week.

## 2021-06-16 NOTE — Assessment & Plan Note (Signed)
The 10-year ASCVD risk score (Arnett DK, et al., 2019) is: 8%   Values used to calculate the score:     Age: 50 years     Sex: Female     Is Non-Hispanic African American: Yes     Diabetic: No     Tobacco smoker: Yes     Systolic Blood Pressure: 373 mmHg     Is BP treated: Yes     HDL Cholesterol: 52 mg/dL     Total Cholesterol: 196 mg/dL Patient is presently on a statin, tolerating well.  We will check LDL today.  LDL today.

## 2021-06-16 NOTE — Assessment & Plan Note (Signed)
Chronic, well controlled, asymptomatic.  Continue amlodipine 10 mg.

## 2021-06-16 NOTE — Assessment & Plan Note (Signed)
Patient previously diagnosed with prediabetes due to A1c of 5.7%.  She notes that she has been working hard on walking, but has had some and dietary indiscretions.  A1c rechecked today 5.5%.  Congratulated patient on improvement, keep up exercise.

## 2021-06-17 LAB — LDL CHOLESTEROL, DIRECT: LDL Direct: 72 mg/dL (ref 0–99)

## 2021-06-17 LAB — RPR: RPR Ser Ql: NONREACTIVE

## 2021-06-17 LAB — CERVICOVAGINAL ANCILLARY ONLY
Chlamydia: NEGATIVE
Comment: NEGATIVE
Comment: NORMAL
Neisseria Gonorrhea: NEGATIVE

## 2021-06-17 LAB — HIV ANTIBODY (ROUTINE TESTING W REFLEX): HIV Screen 4th Generation wRfx: NONREACTIVE

## 2021-06-18 ENCOUNTER — Encounter: Payer: Self-pay | Admitting: Family Medicine

## 2021-06-18 LAB — CYTOLOGY - PAP
Comment: NEGATIVE
Diagnosis: NEGATIVE
Diagnosis: REACTIVE
High risk HPV: NEGATIVE

## 2021-06-25 ENCOUNTER — Ambulatory Visit
Admission: RE | Admit: 2021-06-25 | Discharge: 2021-06-25 | Disposition: A | Discharge status: Home / Self Care | Payer: BC Managed Care – PPO | Source: Ambulatory Visit | Attending: Sports Medicine | Admitting: Sports Medicine

## 2021-06-25 ENCOUNTER — Other Ambulatory Visit: Payer: Self-pay | Admitting: Internal Medicine

## 2021-06-25 ENCOUNTER — Other Ambulatory Visit: Payer: Self-pay

## 2021-06-25 DIAGNOSIS — Z1231 Encounter for screening mammogram for malignant neoplasm of breast: Secondary | ICD-10-CM

## 2021-07-01 ENCOUNTER — Other Ambulatory Visit: Payer: Self-pay

## 2021-07-01 ENCOUNTER — Ambulatory Visit (INDEPENDENT_AMBULATORY_CARE_PROVIDER_SITE_OTHER): Payer: BC Managed Care – PPO | Admitting: Endocrinology

## 2021-07-01 VITALS — BP 144/92 | HR 92 | Ht 62.0 in | Wt 143.0 lb

## 2021-07-01 DIAGNOSIS — E041 Nontoxic single thyroid nodule: Secondary | ICD-10-CM | POA: Diagnosis not present

## 2021-07-01 NOTE — Progress Notes (Signed)
Subjective:    Patient ID: Cheryl Carlson, female    DOB: 1971-05-14, 50 y.o.   MRN: [id number]  HPI Pt returns for f/u of thyroid nodule (dx'ed 1/22; bx 1/22 was cat I, but repeat in 7/41 showed follicular nodule (Bethesda category II).  Pt attributes dysphagia to the nodule. Past Medical History:  Diagnosis Date   GERD (gastroesophageal reflux disease)    Hypertension    Mitral valve prolapse     Past Surgical History:  Procedure Laterality Date   APPENDECTOMY     UPPER GASTROINTESTINAL ENDOSCOPY     uterine tumor removal  2011    Social History   Socioeconomic History   Marital status: Legally Separated    Spouse name: Not on file   Number of children: 1   Years of education: Not on file   Highest education level: Not on file  Occupational History   Occupation: Cusodial  Tobacco Use   Smoking status: Every Day    Packs/day: 0.50    Types: Cigarettes   Smokeless tobacco: Never  Vaping Use   Vaping Use: Never used  Substance and Sexual Activity   Alcohol use: Yes    Comment: socially   Drug use: No   Sexual activity: Not on file  Other Topics Concern   Not on file  Social History Narrative   Not on file   Social Determinants of Health   Financial Resource Strain: Not on file  Food Insecurity: Not on file  Transportation Needs: Not on file  Physical Activity: Not on file  Stress: Not on file  Social Connections: Not on file  Intimate Partner Violence: Not on file    Current Outpatient Medications on File Prior to Visit  Medication Sig Dispense Refill   amLODipine (NORVASC) 10 MG tablet TAKE 1 TABLET(10 MG) BY MOUTH DAILY 90 tablet 3   amoxicillin-clavulanate (AUGMENTIN) 875-125 MG tablet Take 1 tablet by mouth 2 (two) times daily. 14 tablet 0   Ascorbic Acid (VITAMIN C PO) Take by mouth daily.     aspirin EC 81 MG tablet Take 81 mg by mouth daily.     baclofen (LIORESAL) 10 MG tablet TAKE 1 TABLET(10 MG) BY MOUTH THREE TIMES DAILY AS NEEDED FOR  MUSCLE SPASM. Will need appt for refills. 30 tablet 0   Cholecalciferol (VITAMIN D3 PO) Take by mouth daily.     diclofenac Sodium (VOLTAREN) 1 % GEL Apply 4 g topically 4 (four) times daily. 50 g 1   ELDERBERRY PO Take by mouth daily.     Multiple Vitamin (MULTIVITAMIN ADULT PO) Take by mouth. daily     Multiple Vitamins-Minerals (HAIR/SKIN/NAILS) TABS Take 1 tablet by mouth daily.      naproxen (NAPROSYN) 500 MG tablet Take 1 tablet two times per day as needed for pain 60 tablet 0   NON FORMULARY Sea moss daily     NON FORMULARY Super beets daily     omeprazole (PRILOSEC) 40 MG capsule TAKE 1 CAPSULE(40 MG) BY MOUTH EVERY MORNING 30 MINUTES BEFORE BREAKFAST 30 capsule 5   rosuvastatin (CRESTOR) 20 MG tablet Take 1 tablet (20 mg total) by mouth daily. 90 tablet 3   TURMERIC PO Take by mouth daily.     No current facility-administered medications on file prior to visit.    No Known Allergies  Family History  Problem Relation Age of Onset   Heart disease Mother    Hypertension Mother    Diabetes Mother  Hypertension Father    Heart attack Brother 57   Diabetes Maternal Grandmother    Heart attack Brother    Stomach cancer Neg Hx    Esophageal cancer Neg Hx    Colon cancer Neg Hx    Rectal cancer Neg Hx    Thyroid disease Neg Hx     BP (!) 144/92 (BP Location: Right Arm, Patient Position: Sitting, Cuff Size: Normal)   Pulse 92   Ht 5\' 2"  (1.575 m)   Wt 143 lb (64.9 kg)   LMP 04/22/2016   SpO2 96%   BMI 26.16 kg/m   Review of Systems Denies hoarseness    Objective:   Physical Exam VITAL SIGNS:  See vs page GENERAL: no distress NECK: Left thyroid nodule is again easily palpable.  No palpable lymphadenopathy at the anterior neck.     Lab Results  Component Value Date   TSH 0.647 09/22/2020      Assessment & Plan:  Thyroid nodule, clinically stable.  Dysphagia.  We discussed.  She wants to consider surgery.   Patient Instructions  Please see a surgery  specialist.  you will receive a phone call, about a day and time for an appointment.

## 2021-07-01 NOTE — Patient Instructions (Addendum)
Please see a surgery specialist.  you will receive a phone call, about a day and time for an appointment.   ° °

## 2021-09-12 HISTORY — PX: TOTAL SHOULDER ARTHROPLASTY: SHX126

## 2022-01-07 ENCOUNTER — Other Ambulatory Visit: Payer: Self-pay

## 2022-01-07 DIAGNOSIS — M25512 Pain in left shoulder: Secondary | ICD-10-CM

## 2022-01-07 MED ORDER — BACLOFEN 10 MG PO TABS: ORAL_TABLET | ORAL | 2 refills | Status: DC

## 2022-01-26 ENCOUNTER — Encounter: Payer: Self-pay | Admitting: Family Medicine

## 2022-01-26 ENCOUNTER — Ambulatory Visit (INDEPENDENT_AMBULATORY_CARE_PROVIDER_SITE_OTHER): Payer: BC Managed Care – PPO | Admitting: Family Medicine

## 2022-01-26 VITALS — BP 161/98 | HR 85 | Ht 62.0 in | Wt 146.8 lb

## 2022-01-26 DIAGNOSIS — G8929 Other chronic pain: Secondary | ICD-10-CM

## 2022-01-26 DIAGNOSIS — I10 Essential (primary) hypertension: Secondary | ICD-10-CM | POA: Diagnosis not present

## 2022-01-26 DIAGNOSIS — M25512 Pain in left shoulder: Secondary | ICD-10-CM

## 2022-01-26 DIAGNOSIS — E782 Mixed hyperlipidemia: Secondary | ICD-10-CM

## 2022-01-26 DIAGNOSIS — E041 Nontoxic single thyroid nodule: Secondary | ICD-10-CM | POA: Diagnosis not present

## 2022-01-26 DIAGNOSIS — Z720 Tobacco use: Secondary | ICD-10-CM

## 2022-01-26 MED ORDER — METHYLPREDNISOLONE ACETATE 40 MG/ML IJ SUSP
40.0000 mg | Freq: Once | INTRAMUSCULAR | Status: AC
  Administered 2022-01-26: 40 mg via INTRAMUSCULAR

## 2022-01-26 MED ORDER — AMLODIPINE-OLMESARTAN 10-20 MG PO TABS: 1.0000 | ORAL_TABLET | Freq: Every day | ORAL | 3 refills | Status: DC

## 2022-01-26 NOTE — Assessment & Plan Note (Signed)
15 pack-year history. Not interested in quitting just yet, will continue to check in intermittently. She does not yet qualify for low-dose lung cancer screening yet. ?

## 2022-01-26 NOTE — Assessment & Plan Note (Signed)
Patient continues to have left thyroid nodule, 2022 FNA with bethesda II benign cytology. She is not reporting dysphagia symptoms today, but has had them in the past. Recommend she see surgeon for consultation given enlargement with intermittent dysphagia. Will check TSH today. ?

## 2022-01-26 NOTE — Patient Instructions (Addendum)
It was a pleasure to see you today! ? ?We will get some labs today.  If they are abnormal or we need to do something about them, I will call you.  If they are normal, I will send you a message on MyChart (if it is active) or a letter in the mail.  If you don't hear from [place] in 2 weeks, please call the office  [phone]. ?You got a corticosteroid injection in your left shoulder today. Most people start having pain relief at about 24 hours. You can take tylenol or ibuprofen 600 mg every 6 hrs for pain tonight and tomorrow. You can use ice or heat- whichever feels better- for pain relief as well. In about 1-2 days, start the gentle range of motion exercises we discussed. Follow up in 2-4 weeks if no improvement ?Start taking amlodipine-olmesartan 10-20 mg once daily for high blood pressure. Take your blood pressure at the same time of day for the next week after starting the medication with both feet flat on the floor. Write down your blood pressures and call with the results or make an appt to follow up in 2 weeks. ? ? ?Be Well, ? ?Dr. Chauncey Reading ? ?

## 2022-01-26 NOTE — Progress Notes (Signed)
SUBJECTIVE:   CHIEF COMPLAINT / HPI:   HTN: BP is not at goal today 150-160s/90s. Currently taking amlodipine 10 mg qd. Asx.  HLD: Direct LDL at about goal of 72 at last check in October 2022. ASCVD at that time intermediate at 8%. Last Lipid panel in July 2022. Patient taking crestor 20 mg qd. Recommend lipid panel at next visit per insurance.  Left thyroid nodule: H/o left thyroid nodule with [place] and FNA in March 2022 with Bethesda II category on cytology. She was having dysphagia in October and I recommended consultation with surgery. She followed up with endocrinology, Dr. Loanne Drilling, who also recommended consultation with surgical specialist. Since then she could not afford co pay since surgeon was out of network. No dysphagia.   Tobacco use: still smoking. 30 year smoking history. 1/2 ppd, about 15 pack year history.   Shoulder pain: history of left shoulder pain for about 3 years. It has gotten acutely worse in the last week. 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 It is particularly bothersome at night. She has tried muscle relaxants, OTC tylenol without relief. Pain is deep ache that radiates down the arm. She has pain with abduction. She is interested in CSI.  PERTINENT  PMH / PSH: thyroid nodule, tobacco use, hypertension  OBJECTIVE:   BP (!) 161/98   Pulse 85   Ht '5\' 2"'$  (1.575 m)   Wt 146 lb 12.8 oz (66.6 kg)   LMP 04/22/2016   SpO2 99%   BMI 26.85 kg/m   Nursing note and vitals reviewed GEN: appears younger than stated age, AAW, resting comfortably in chair, NAD, WNWD HEENT: NCAT. PERRLA. Sclera without injection or icterus. MMM.  Neck: Supple. Left thyroid nodule easily palpable, no LAD Left shoulder: No evidence of bony deformity, asymmetry, or muscle atrophy; No tenderness over long head of biceps (bicipital groove). No TTP at [facility] joint. Full active and passive range of motion (180 flex Huel Cote /150Abd /90ER /70IR) pain with abduction  above 90*. Strength 5/5 throughout. No abnormal scapular function observed. Sensation intact. Peripheral pulses intact. +Hawkins, +Empty can, -Spurling Cardiac: Regular rate and rhythm. Normal S1/S2. No murmurs, rubs, or gallops appreciated. 2+ radial pulses. Lungs: Clear bilaterally to ascultation. No increased WOB, no accessory muscle usage. No w/r/r. Neuro: AOx3  Ext: no edema Psych: Pleasant and appropriate   ASSESSMENT/PLAN:   Tobacco abuse 15 pack-year history. Not interested in quitting just yet, will continue to check in intermittently. She does not yet qualify for low-dose lung cancer screening yet.  Hyperlipidemia Recheck lipid panel after July 2023.  Essential hypertension, benign Chronic, not well controlled, asymptomatic. Start amlodipine-omesartan 10-'20mg'$ . Recheck in 2 weeks, get BMP.  Thyroid nodule Patient continues to have left thyroid nodule, 2022 FNA with bethesda II benign cytology. She is not reporting dysphagia symptoms today, but has had them in the past. Recommend she see surgeon for consultation given enlargement with intermittent dysphagia. Will check TSH today.  Chronic left shoulder pain Acute on chronic left shoulder pain, signs of impingement on exam. Suspect overuse injury given profession and age. Patient is seeing [facility] later this month, but is interested in pain relief. She has failed conservative tx with OTC NSAIDs and flexeril. Patient is interested in CSI, completed today, see below. Recommend follow up with [facility] or myself for imaging if no improvement. Continue conservative mgmt and gentle ROM exercises given.  PROCEDURE: INJECTION: Patient was given informed consent, signed copy in  the chart. Appropriate time out was taken. Area prepped and draped in usual sterile fashion. Ethyl chloride was  used for local anesthesia. A 21 gauge 1 1/2 inch needle was used. 4 cc of methylprednisolone 40 mg/ml plus 1 cc of 1% lidocaine without epinephrine  was injected into the left subacromial space using a posterior approach.   The patient tolerated the procedure well. There were no complications. Post procedure instructions were given.   Gladys Damme, MD [place]

## 2022-01-26 NOTE — Assessment & Plan Note (Signed)
Acute on chronic left shoulder pain, signs of impingement on exam. Suspect overuse injury given profession and age. Patient is seeing [facility] later this month, but is interested in pain relief. She has failed conservative tx with OTC NSAIDs and flexeril. Patient is interested in CSI, completed today, see below. Recommend follow up with [facility] or myself for imaging if no improvement. Continue conservative mgmt and gentle ROM exercises given. ? ?PROCEDURE: INJECTION: ?Patient was given informed consent, signed copy in the chart. Appropriate time out was taken. Area prepped and draped in usual sterile fashion. Ethyl chloride was  used for local anesthesia. A 21 gauge 1 1/2 inch needle was used. 4 cc of methylprednisolone 40 mg/ml plus 1 cc of 1% lidocaine without epinephrine was injected into the left subacromial space using a posterior approach.  ? ?The patient tolerated the procedure well. There were no complications. Post procedure instructions were given. ? ?

## 2022-01-26 NOTE — Assessment & Plan Note (Signed)
Chronic, not well controlled, asymptomatic. Start amlodipine-omesartan 10-'20mg'$ . Recheck in 2 weeks, get BMP. ?

## 2022-01-26 NOTE — Assessment & Plan Note (Signed)
Recheck lipid panel after July 2023. ?

## 2022-01-27 ENCOUNTER — Other Ambulatory Visit: Payer: Self-pay | Admitting: Family Medicine

## 2022-01-27 ENCOUNTER — Encounter: Payer: Self-pay | Admitting: Family Medicine

## 2022-01-27 DIAGNOSIS — I10 Essential (primary) hypertension: Secondary | ICD-10-CM

## 2022-01-27 LAB — TSH: TSH: 1.37 u[IU]/mL (ref 0.450–4.500)

## 2022-01-28 ENCOUNTER — Other Ambulatory Visit: Payer: Self-pay | Admitting: Family Medicine

## 2022-01-28 DIAGNOSIS — I10 Essential (primary) hypertension: Secondary | ICD-10-CM

## 2022-01-29 LAB — BASIC METABOLIC PANEL
BUN/Creatinine Ratio: 16 (ref 9–23)
BUN: 12 mg/dL (ref 6–24)
CO2: 23 mmol/L (ref 20–29)
Calcium: 10 mg/dL (ref 8.7–10.2)
Chloride: 101 mmol/L (ref 96–106)
Creatinine, Ser: 0.74 mg/dL (ref 0.57–1.00)
Glucose: 89 mg/dL (ref 70–99)
Potassium: 4.5 mmol/L (ref 3.5–5.2)
Sodium: 139 mmol/L (ref 134–144)
eGFR: 97 mL/min/{1.73_m2} (ref >59)

## 2022-01-29 LAB — SPECIMEN STATUS REPORT

## 2022-02-09 ENCOUNTER — Encounter: Payer: Self-pay | Admitting: Family Medicine

## 2022-04-10 ENCOUNTER — Other Ambulatory Visit: Payer: Self-pay | Admitting: Internal Medicine

## 2022-04-11 ENCOUNTER — Other Ambulatory Visit: Payer: Self-pay | Admitting: Internal Medicine

## 2022-04-20 ENCOUNTER — Telehealth: Payer: Self-pay

## 2022-04-20 NOTE — Telephone Encounter (Signed)
Primary Cardiologist:Bridgette Harrell Gave, MD  Chart reviewed as part of pre-operative protocol coverage. Because of Cheryl Carlson's past medical history and time since last visit, he/she will require a follow-up visit in order to better assess preoperative cardiovascular risk.  Pre-op covering staff: - Please schedule appointment and call patient to inform them. - Please contact requesting surgeon's office via preferred method (i.e, phone, fax) to inform them of need for appointment prior to surgery.  If applicable, this message will also be routed to pharmacy pool and/or primary cardiologist for input on holding anticoagulant/antiplatelet agent as requested below so that this information is available at time of patient's appointment.   Emmaline Life, NP-C    04/20/2022, 4:22 PM [place] [address] Office [phone] Fax [phone]

## 2022-04-20 NOTE — Telephone Encounter (Signed)
   Pre-operative Risk Assessment    Patient Name: Cheryl Carlson  DOB: [birth year] MRN: [id number]      Request for Surgical Clearance    Procedure:   Left Total Shoulder Arthroplasty  Date of Surgery:  Clearance 05/24/22                                 Surgeon:  Dr. Justice Britain Surgeon's Group or Practice Name:  [facility] Phone number:  [phone] Fax number:  [phone]   Type of Clearance Requested:   - Medical    Type of Anesthesia:  General    Additional requests/questions:  Please advise surgeon/provider what medications should be held.  Signed, Elsie Lincoln Isiaah Cuervo   04/20/2022, 1:21 PM

## 2022-04-21 ENCOUNTER — Telehealth: Payer: Self-pay | Admitting: Family Medicine

## 2022-04-21 NOTE — Telephone Encounter (Signed)
Pre-op form received for patient for L shoulder surgery. Patient needs follow up appointment with PCP or another provider to complete form as she was to be seen 2 weeks after last visit.   Nursing- please reach out to schedule visit.  Dorris Singh, MD  Family Medicine Teaching Service

## 2022-04-21 NOTE — Telephone Encounter (Signed)
S/w the pt and she has been scheduled to see Dr. Harrell Gave 04/25/22 @ 8:20 at [facility] location. Pt has been given address for [facility]. Pt thanked me for the call and the help. States she is due to see Dr. Harrell Gave this fall anyway. I will send notes to MD for pre op clearance. Will send FYI to requesting office pt has appt 04/25/22

## 2022-04-22 NOTE — Telephone Encounter (Signed)
Contacted the pt to schedule appt. Pt scheduled for 08/15 '@8'$ :30 am with Dr.Jones.

## 2022-04-25 ENCOUNTER — Ambulatory Visit (INDEPENDENT_AMBULATORY_CARE_PROVIDER_SITE_OTHER): Payer: BC Managed Care – PPO | Admitting: Cardiology

## 2022-04-25 ENCOUNTER — Encounter (HOSPITAL_BASED_OUTPATIENT_CLINIC_OR_DEPARTMENT_OTHER): Payer: Self-pay | Admitting: Cardiology

## 2022-04-25 VITALS — BP 132/88 | HR 81 | Ht 62.0 in | Wt 149.0 lb

## 2022-04-25 DIAGNOSIS — Z01810 Encounter for preprocedural cardiovascular examination: Secondary | ICD-10-CM

## 2022-04-25 DIAGNOSIS — Z7189 Other specified counseling: Secondary | ICD-10-CM | POA: Diagnosis not present

## 2022-04-25 DIAGNOSIS — I10 Essential (primary) hypertension: Secondary | ICD-10-CM

## 2022-04-25 DIAGNOSIS — Z8249 Family history of ischemic heart disease and other diseases of the circulatory system: Secondary | ICD-10-CM

## 2022-04-25 NOTE — Progress Notes (Signed)
Cardiology Office Note:    Date:  04/25/2022   ID:  Cheryl Carlson, DOB 10/11/1970, MRN [id number]  PCP:  Bronson, Martin, MD  Cardiologist:  Bridgette Christopher, MD PhD  Referring MD: Bronson, Martin, MD   CC: follow up  History of Present Illness:    Cheryl Carlson is a 51 y.o. female with a hx of HTN, MVP, GERD, tobacco abuse who is seen in follow up today. I initially met her 07/31/18.  She also presents today for pre-operative clearance prior to her left total shoulder arthroplasty scheduled for 05/2022 to be performed by Dr. Supple at [facility].  Cardiac history/risk: She has significant family history of CAD; brother had MI and had ICD implanted at age 49.  Has had 2 week monitor placed for palpitations, treadmill exercise for chest pain, and echocardiogram for mitral valve prolapse. All testing was normal.  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 Mother was still smoking as well, which frustrated her. She was also still smoking, felt too stressed out with her mom to quit. Rare chest pain, mild, nonlimiting. Shoulder pain was most bothersome, awaiting evaluation and possibly surgery. We discussed Covid. She was not vaccinated, and I recommended this strongly.  Today: At this time she denies any chest pain or shortness of breath. The pain she complains of today is related to her left shoulder pain.   Her most strenuous activity recently was last Sunday when she picked up a heavy bag of dog food. She is able to complete light work around the house. She endorses being able to walk 1-2 blocks, climb a flight of stairs or walk up a hill.   Previously she underwent appendectomy without adverse reactions to medications or anesthesia.  Recently she has fallen out of the habit of monitoring her blood pressures at home. She has been focused on caring for her mother.  Currently she is smoking 1 pack of  cigarettes every 2 days. She has a goal of quitting smoking by the time of her shoulder arthroplasty. She is motivated to eliminate excess pain during recovery that would be exacerbated by coughing.  She denies any palpitations, or peripheral edema. No lightheadedness, headaches, syncope, orthopnea, or PND.  Past Medical History:  Diagnosis Date   GERD (gastroesophageal reflux disease)    Hypertension    Mitral valve prolapse     Past Surgical History:  Procedure Laterality Date   APPENDECTOMY     UPPER GASTROINTESTINAL ENDOSCOPY     uterine tumor removal  2011    Current Medications: Current Outpatient Medications on File Prior to Visit  Medication Sig   amlodipine-olmesartan (AZOR) 10-20 MG tablet Take 1 tablet by mouth daily.   amoxicillin-clavulanate (AUGMENTIN) 875-125 MG tablet Take 1 tablet by mouth 2 (two) times daily.   Ascorbic Acid (VITAMIN C PO) Take by mouth daily.   aspirin EC 81 MG tablet Take 81 mg by mouth daily.   baclofen (LIORESAL) 10 MG tablet TAKE 1 TABLET(10 MG) BY MOUTH THREE TIMES DAILY AS NEEDED FOR MUSCLE SPASM. Will need appt for refills.   Cholecalciferol (VITAMIN D3 PO) Take by mouth daily.   diclofenac Sodium (VOLTAREN) 1 % GEL Apply 4 g topically 4 (four) times daily.   ELDERBERRY PO Take by mouth daily.   Multiple Vitamin (MULTIVITAMIN ADULT PO) Take by mouth. daily   Multiple Vitamins-Minerals (HAIR/SKIN/NAILS) TABS Take 1 tablet by mouth daily.      naproxen (NAPROSYN) 500 MG tablet Take 1 tablet two times per day as needed for pain   NON FORMULARY Sea moss daily   NON FORMULARY Super beets daily   omeprazole (PRILOSEC) 40 MG capsule TAKE 1 CAPSULE(40 MG) BY MOUTH EVERY MORNING 30 MINUTES BEFORE BREAKFAST   rosuvastatin (CRESTOR) 20 MG tablet Take 1 tablet (20 mg total) by mouth daily.   TURMERIC PO Take by mouth daily.   No current facility-administered medications on file prior to visit.     Allergies:   Patient has no known allergies.    Social History   Tobacco Use   Smoking status: Every Day    Packs/day: 0.50    Types: Cigarettes   Smokeless tobacco: Never  Vaping Use   Vaping Use: Never used  Substance Use Topics   Alcohol use: Yes    Comment: socially   Drug use: No    Family History: The patient's family history includes Diabetes in her maternal grandmother and mother; Heart attack in her brother; Heart attack (age of onset: 49) in her brother; Heart disease in her mother; Hypertension in her father and mother. There is no history of Stomach cancer, Esophageal cancer, Colon cancer, Rectal cancer, or Thyroid disease.  ROS:   Please see the history of present illness.   (+) Left shoulder pain Additional pertinent ROS otherwise unremarkable.  EKGs/Labs/Other Studies Reviewed:    The following studies were reviewed today:  05/10/18 ETT Blood pressure demonstrated a normal response to exercise. There was no ST segment deviation noted during stress.   1. Good exercise tolerance.  2. No ischemic ST segment changes.   06/01/18 Monitor Monitor worn for 3 days 15 hours. No afib or significant arrhythmia detected. Less than 1% ectopic beats. Baseline is sinus rhythm at 66 bpm. 12 patient triggered events, all were sinus rhythm or sinus tachycardia. Of note, 20% of her recording was sinus tachycardia, remaining 80% was normal sinus rhythm.  05/03/18 Echo  Study Conclusions  - Left ventricle: The cavity size was normal. Systolic function was   normal. The estimated ejection fraction was in the range of 60%   to 65%. Wall motion was normal; there were no regional wall   motion abnormalities. Left ventricular diastolic function   parameters were normal. - Aortic valve: Transvalvular velocity was within the normal range.   There was no stenosis. There was no regurgitation. - Mitral valve: Transvalvular velocity was within the normal range.   There was no evidence for stenosis. There was no regurgitation. -  Right ventricle: The cavity size was normal. Wall thickness was   normal. Systolic function was normal. - Atrial septum: No defect or patent foramen ovale was identified   by color flow Doppler. - Tricuspid valve: There was trivial regurgitation. - Pulmonary arteries: Systolic pressure was within the normal   range. PA peak pressure: 23 mm Hg (S).   EKG:  EKG is personally reviewed.   04/25/2022:  NSR at 81 bpm, V1-V2 pattern unchanged from prior 08/10/2020: normal sinus rhythm, lack of R waves in V1-V2, unchanged from prior  Recent Labs: 01/26/2022: BUN 12; Creatinine, Ser 0.74; Potassium 4.5; Sodium 139; TSH 1.370   Recent Lipid Panel    Component Value Date/Time   CHOL 196 03/18/2021 1620   TRIG 181 (H) 03/18/2021 1620   HDL 52 03/18/2021 1620   CHOLHDL 3.8 03/18/2021 1620   CHOLHDL 2.7 11/30/2015 0904   VLDL 21 11/30/2015 0904   LDLCALC 112 (H) 03/18/2021 1620     LDLDIRECT 72 06/16/2021 1524    Physical Exam:    VS:  BP 132/88 (BP Location: Right Arm, Patient Position: Sitting, Cuff Size: Normal)   Pulse 81   Ht 5' 2" (1.575 m)   Wt 149 lb (67.6 kg)   LMP 04/22/2016   SpO2 96%   BMI 27.25 kg/m     Wt Readings from Last 3 Encounters:  04/25/22 149 lb (67.6 kg)  01/26/22 146 lb 12.8 oz (66.6 kg)  07/01/21 143 lb (64.9 kg)    GEN: Well nourished, well developed in no acute distress HEENT: Normal, moist mucous membranes NECK: No JVD CARDIAC: regular rhythm, normal S1 and S2, no rubs or gallops. No murmur. VASCULAR: Radial and DP pulses 2+ bilaterally. No carotid bruits RESPIRATORY:  Clear to auscultation without rales, wheezing or rhonchi  ABDOMEN: Soft, non-tender, non-distended MUSCULOSKELETAL:  Ambulates independently SKIN: Warm and dry, no edema NEUROLOGIC:  Alert and oriented x 3. No focal neuro deficits noted. PSYCHIATRIC:  Normal affect   ASSESSMENT:    1. Preop cardiovascular exam   2. Essential hypertension, benign   3. Family history of heart  disease   4. Cardiac risk counseling   5. Counseling on health promotion and disease prevention     PLAN:    Preoperative cardiovascular risk assessment According to the Revised Cardiac Risk Index (RCRI), her Perioperative Risk of Major Cardiac Event is (%): 0.4  The patient is not currently having active cardiac symptoms, and they can achieve >4 METs of activity.  According to ACC/AHA Guidelines, no further testing is needed.  Proceed with surgery at acceptable risk.  Our service is available as needed in the peri-operative period.     Hypertension: goal <130/80 -typically at goal, but in significant pain today -continue amlodipine  MSK pain: shoulder pain pending surgery, this is her source of chest pain as well  Tobacco abuse: working toward cessation, congratulated  Prevention and health counseling: has significant family history of heart disease -recommend heart healthy/Mediterranean diet, with whole grains, fruits, vegetable, fish, lean meats, nuts, and olive oil. Limit salt. -recommend moderate walking, 3-5 times/week for 30-50 minutes each session. Aim for at least 150 minutes.week. Goal should be pace of 3 miles/hours, or walking 1.5 miles in 30 minutes -recommend avoidance of tobacco products. Avoid excess alcohol. -she prefers to continue daily aspirin.  Would recalculate risk score once free of tobacco and with her more typical BP numbers.  The 10-year ASCVD risk score (Arnett DK, et al., 2019) is: 9.8%   Values used to calculate the score:     Age: 51 years     Sex: Female     Is Non-Hispanic African American: Yes     Diabetic: No     Tobacco smoker: Yes     Systolic Blood Pressure: 132 mmHg     Is BP treated: Yes     HDL Cholesterol: 52 mg/dL     Total Cholesterol: 196 mg/dL  Plan for follow up: 2 years or sooner PRN  Medication Adjustments/Labs and Tests Ordered: Current medicines are reviewed at length with the patient today.  Concerns regarding medicines  are outlined above.   Orders Placed This Encounter  Procedures   EKG 12-Lead   No orders of the defined types were placed in this encounter.  Patient Instructions  Medication Instructions:  Your Physician recommend you continue on your current medication as directed.    *If you need a refill on your cardiac medications before your next appointment,   please call your pharmacy*   Lab Work: None ordered today   Testing/Procedures: None ordered today   Follow-Up: At [facility], you and your health needs are our priority.  As part of our continuing mission to provide you with exceptional heart care, we have created designated Provider Care Teams.  These Care Teams include your primary Cardiologist (physician) and Advanced Practice Providers (APPs -  Physician Assistants and Nurse Practitioners) who all work together to provide you with the care you need, when you need it.  We recommend signing up for the patient portal called "MyChart".  Sign up information is provided on this After Visit Summary.  MyChart is used to connect with patients for Virtual Visits (Telemedicine).  Patients are able to view lab/test results, encounter notes, upcoming appointments, etc.  Non-urgent messages can be sent to your provider as well.   To learn more about what you can do with MyChart, go to [email or web address].    Your next appointment:   2 year(s)  The format for your next appointment:   In Person  Provider:   Bridgette Christopher, MD{          I,Mathew Stumpf,acting as a scribe for Bridgette Christopher, MD.,have documented all relevant documentation on the behalf of Bridgette Christopher, MD,as directed by  Bridgette Christopher, MD while in the presence of Bridgette Christopher, MD.  I, Bridgette Christopher, MD, have reviewed all documentation for this visit. The documentation on 04/25/22 for the exam, diagnosis, procedures, and orders are all accurate and complete.    Signed, Bridgette Christopher, MD PhD 04/25/2022 8:48 PM    [facility] 

## 2022-04-25 NOTE — Progress Notes (Addendum)
    SUBJECTIVE:   CHIEF COMPLAINT / HPI:   Shoulder pain Pt plans for L total arthroplasty next month with [facility].  Pt seen by cardiology yesterday and stated her perioperative risk of major cardiac event is 0.4% no further testing is needed and patient can proceed with surgery.   Thyroid nodule Patient had FNA in March 2022 with Bethesda 2 category 1 cytology.  She was previously recommended to follow-up for surgery consultation.  Had apt to see ENT doctor to discuss removal as patient has had dysphagia in the past due to nodule but has to reschedule d/t shoulder sx. she plans on doing this after her shoulder surgery.  HTN BP today 141/108 pt states d/t her shoulder pain. Currently taking Azor 10-20 mg  HLD LDL in 2022 of 72.  Patient currently takes Crestor 20 mg daily.  We will check lipid panel today.  PERTINENT  PMH / PSH: HTN, HLD, thyroid nodule, shoulder pain  OBJECTIVE:   Vitals:   04/26/22 0838 04/26/22 0857  BP: (!) 141/108 (!) 135/106  Pulse: 100   SpO2: 100%      General: NAD, pleasant, able to participate in exam Neck: left thyroid nodule palpable Cardiac: RRR, no murmurs. Respiratory: CTAB, normal effort, No wheezes, rales or rhonchi Extremities: no edema  Skin: warm and dry, no rashes noted Neuro: alert, no obvious focal deficits Psych: Normal affect and mood  ASSESSMENT/PLAN:     Essential hypertension BP on recheck 135/106.  Patient to continue taking Azor 10-20 mg. -BMP -Return a couple months after shoulder surgery for HTN follow-up  Hyperlipidemia -Continue Crestor -Lipid panel  Thyroid nodule -Patient follow-up with ENT after shoulder surgery  Preop examination Patient is medically optimized for her left shoulder arthroplasty, was also seen by cardiology yesterday.  As we are already checking lipid panel and BMP, patient requests we order any other labs we think may be needed prior to surgery.  CBC also ordered.  Preop risk assessment  form completed.  Dr. Precious Gilding, [place]

## 2022-04-25 NOTE — Patient Instructions (Signed)
Medication Instructions:  Your Physician recommend you continue on your current medication as directed.    *If you need a refill on your cardiac medications before your next appointment, please call your pharmacy*   Lab Work: None ordered today   Testing/Procedures: None ordered today   Follow-Up: At [facility], you and your health needs are our priority.  As part of our continuing mission to provide you with exceptional heart care, we have created designated Provider Care Teams.  These Care Teams include your primary Cardiologist (physician) and Advanced Practice Providers (APPs -  Physician Assistants and Nurse Practitioners) who all work together to provide you with the care you need, when you need it.  We recommend signing up for the patient portal called "MyChart".  Sign up information is provided on this After Visit Summary.  MyChart is used to connect with patients for Virtual Visits (Telemedicine).  Patients are able to view lab/test results, encounter notes, upcoming appointments, etc.  Non-urgent messages can be sent to your provider as well.   To learn more about what you can do with MyChart, go to [email or web address].    Your next appointment:   2 year(s)  The format for your next appointment:   In Person  Provider:   Buford Dresser, MD{

## 2022-04-26 ENCOUNTER — Ambulatory Visit (INDEPENDENT_AMBULATORY_CARE_PROVIDER_SITE_OTHER): Payer: BC Managed Care – PPO | Admitting: Student

## 2022-04-26 ENCOUNTER — Other Ambulatory Visit: Payer: Self-pay

## 2022-04-26 ENCOUNTER — Encounter: Payer: Self-pay | Admitting: Student

## 2022-04-26 VITALS — BP 135/106 | HR 100 | Wt 149.8 lb

## 2022-04-26 DIAGNOSIS — Z01818 Encounter for other preprocedural examination: Secondary | ICD-10-CM | POA: Diagnosis not present

## 2022-04-26 DIAGNOSIS — E785 Hyperlipidemia, unspecified: Secondary | ICD-10-CM | POA: Diagnosis not present

## 2022-04-26 DIAGNOSIS — I10 Essential (primary) hypertension: Secondary | ICD-10-CM

## 2022-04-26 NOTE — Patient Instructions (Signed)
It was great to see you! Thank you for allowing me to participate in your care!   Our plans for today:  - See ENT after shoulder surgery - Return for follow up visit for blood pressure in 3-4 months  We are checking some labs today, I will call you if they are abnormal will send you a MyChart message or a letter if they are normal.  If you do not hear about your labs in the next 2 weeks please let us know.  Take care and seek immediate care sooner if you develop any concerns.   Dr. Precious Gilding, DO [facility]

## 2022-04-26 NOTE — Assessment & Plan Note (Signed)
BP on recheck 135/106.  Patient to continue taking Azor 10-20 mg. -BMP -Return a couple months after shoulder surgery for HTN follow-up

## 2022-04-27 LAB — LIPID PANEL
Chol/HDL Ratio: 5.2 ratio — ABNORMAL HIGH (ref 0.0–4.4)
Cholesterol, Total: 215 mg/dL — ABNORMAL HIGH (ref 100–199)
HDL: 41 mg/dL (ref >39)
LDL Chol Calc (NIH): 93 mg/dL (ref 0–99)
Triglycerides: 487 mg/dL — ABNORMAL HIGH (ref 0–149)
VLDL Cholesterol Cal: 81 mg/dL — ABNORMAL HIGH (ref 5–40)

## 2022-04-27 LAB — CBC
Hematocrit: 44.3 % (ref 34.0–46.6)
Hemoglobin: 14 g/dL (ref 11.1–15.9)
MCH: 27.8 pg (ref 26.6–33.0)
MCHC: 31.6 g/dL (ref 31.5–35.7)
MCV: 88 fL (ref 79–97)
Platelets: 389 10*3/uL (ref 150–450)
RBC: 5.04 x10E6/uL (ref 3.77–5.28)
RDW: 16.2 % — ABNORMAL HIGH (ref 11.7–15.4)
WBC: 9.6 10*3/uL (ref 3.4–10.8)

## 2022-04-27 LAB — BASIC METABOLIC PANEL
BUN/Creatinine Ratio: 20 (ref 9–23)
BUN: 14 mg/dL (ref 6–24)
CO2: 23 mmol/L (ref 20–29)
Calcium: 9.9 mg/dL (ref 8.7–10.2)
Chloride: 103 mmol/L (ref 96–106)
Creatinine, Ser: 0.71 mg/dL (ref 0.57–1.00)
Glucose: 96 mg/dL (ref 70–99)
Potassium: 4.7 mmol/L (ref 3.5–5.2)
Sodium: 142 mmol/L (ref 134–144)
eGFR: 102 mL/min/{1.73_m2} (ref >59)

## 2022-04-28 ENCOUNTER — Telehealth: Payer: Self-pay | Admitting: Student

## 2022-04-28 NOTE — Telephone Encounter (Signed)
Called and discussed recent labs with patient. Pt now has moderate hypertriglyceridemia. She is already on a high dose statin, Crestor 20 mg. She prefers to stay on this instead of increasing to 40 mg. She plans on making life-style changes including eating better, not drinking alcohol and has plans to quit smoking. Can check lipid panel again in a year.

## 2022-05-02 ENCOUNTER — Other Ambulatory Visit: Payer: Self-pay

## 2022-05-02 DIAGNOSIS — E782 Mixed hyperlipidemia: Secondary | ICD-10-CM

## 2022-05-02 MED ORDER — ROSUVASTATIN CALCIUM 20 MG PO TABS
20.0000 mg | ORAL_TABLET | Freq: Every day | ORAL | 3 refills | Status: DC
Start: 2022-05-02 — End: 2023-06-23

## 2022-05-20 ENCOUNTER — Encounter: Payer: Self-pay | Admitting: *Deleted

## 2022-05-20 DIAGNOSIS — Z006 Encounter for examination for normal comparison and control in clinical research program: Secondary | ICD-10-CM

## 2022-05-20 NOTE — Research (Signed)
Spoke with Ms Luepke about Designer, [employer]. States she would like more information. A copy of the consent emailed to her to review. Encouraged her to call with any questions.

## 2022-07-19 ENCOUNTER — Other Ambulatory Visit (HOSPITAL_COMMUNITY): Payer: Self-pay | Admitting: Sports Medicine

## 2022-07-19 ENCOUNTER — Ambulatory Visit (HOSPITAL_COMMUNITY)
Admission: RE | Admit: 2022-07-19 | Discharge: 2022-07-19 | Disposition: A | Discharge status: Home / Self Care | Payer: BC Managed Care – PPO | Source: Ambulatory Visit | Attending: Sports Medicine | Admitting: Sports Medicine

## 2022-07-19 DIAGNOSIS — M79604 Pain in right leg: Secondary | ICD-10-CM

## 2022-07-26 ENCOUNTER — Other Ambulatory Visit: Payer: Self-pay

## 2022-07-26 ENCOUNTER — Other Ambulatory Visit: Payer: Self-pay | Admitting: Internal Medicine

## 2022-07-26 DIAGNOSIS — I10 Essential (primary) hypertension: Secondary | ICD-10-CM

## 2022-07-26 MED ORDER — AMLODIPINE-OLMESARTAN 10-20 MG PO TABS: 1.0000 | ORAL_TABLET | Freq: Every day | ORAL | 3 refills | Status: DC

## 2022-07-26 NOTE — Telephone Encounter (Signed)
She has not been seen since 29021 and that was a colonoscopy  In order to prescribe would need to know why she is asking for a refill and she will still need to come in ane bd seen if we do rx in advance of an appointment  Please ask her

## 2022-07-26 NOTE — Telephone Encounter (Signed)
Patient is requesting Omeprazole. Please advise.  

## 2022-09-08 ENCOUNTER — Other Ambulatory Visit: Payer: Self-pay | Admitting: Otolaryngology

## 2022-09-08 DIAGNOSIS — E079 Disorder of thyroid, unspecified: Secondary | ICD-10-CM

## 2022-09-08 DIAGNOSIS — F17200 Nicotine dependence, unspecified, uncomplicated: Secondary | ICD-10-CM

## 2022-09-20 ENCOUNTER — Other Ambulatory Visit: Payer: BC Managed Care – PPO

## 2022-09-21 ENCOUNTER — Ambulatory Visit: Payer: BC Managed Care – PPO | Admitting: Internal Medicine

## 2022-10-05 ENCOUNTER — Other Ambulatory Visit: Payer: BC Managed Care – PPO

## 2022-10-11 ENCOUNTER — Ambulatory Visit
Admission: RE | Admit: 2022-10-11 | Discharge: 2022-10-11 | Disposition: A | Discharge status: Home / Self Care | Payer: BC Managed Care – PPO | Source: Ambulatory Visit | Attending: Otolaryngology | Admitting: Otolaryngology

## 2022-10-11 DIAGNOSIS — E079 Disorder of thyroid, unspecified: Secondary | ICD-10-CM

## 2022-10-11 DIAGNOSIS — F17200 Nicotine dependence, unspecified, uncomplicated: Secondary | ICD-10-CM

## 2022-12-22 ENCOUNTER — Other Ambulatory Visit: Payer: Self-pay | Admitting: Internal Medicine

## 2022-12-28 ENCOUNTER — Other Ambulatory Visit: Payer: Self-pay | Admitting: Internal Medicine

## 2023-01-07 ENCOUNTER — Other Ambulatory Visit: Payer: Self-pay | Admitting: Internal Medicine

## 2023-01-11 ENCOUNTER — Encounter (HOSPITAL_COMMUNITY): Payer: Self-pay

## 2023-01-11 ENCOUNTER — Emergency Department (HOSPITAL_COMMUNITY): Payer: BC Managed Care – PPO

## 2023-01-11 ENCOUNTER — Other Ambulatory Visit: Payer: Self-pay

## 2023-01-11 ENCOUNTER — Observation Stay (HOSPITAL_COMMUNITY)
Admission: EM | Admit: 2023-01-11 | Discharge: 2023-01-13 | Disposition: A | Discharge status: Home / Self Care | Payer: BC Managed Care – PPO | Attending: Internal Medicine | Admitting: Internal Medicine

## 2023-01-11 DIAGNOSIS — D72829 Elevated white blood cell count, unspecified: Secondary | ICD-10-CM | POA: Diagnosis not present

## 2023-01-11 DIAGNOSIS — R112 Nausea with vomiting, unspecified: Secondary | ICD-10-CM | POA: Diagnosis not present

## 2023-01-11 DIAGNOSIS — R1030 Lower abdominal pain, unspecified: Secondary | ICD-10-CM | POA: Diagnosis present

## 2023-01-11 DIAGNOSIS — R1032 Left lower quadrant pain: Secondary | ICD-10-CM | POA: Diagnosis present

## 2023-01-11 DIAGNOSIS — R1900 Intra-abdominal and pelvic swelling, mass and lump, unspecified site: Secondary | ICD-10-CM

## 2023-01-11 DIAGNOSIS — F17210 Nicotine dependence, cigarettes, uncomplicated: Secondary | ICD-10-CM | POA: Insufficient documentation

## 2023-01-11 DIAGNOSIS — I10 Essential (primary) hypertension: Secondary | ICD-10-CM | POA: Diagnosis present

## 2023-01-11 DIAGNOSIS — R109 Unspecified abdominal pain: Secondary | ICD-10-CM | POA: Diagnosis not present

## 2023-01-11 DIAGNOSIS — Z720 Tobacco use: Secondary | ICD-10-CM | POA: Diagnosis present

## 2023-01-11 DIAGNOSIS — K5792 Diverticulitis of intestine, part unspecified, without perforation or abscess without bleeding: Secondary | ICD-10-CM

## 2023-01-11 DIAGNOSIS — K5732 Diverticulitis of large intestine without perforation or abscess without bleeding: Secondary | ICD-10-CM | POA: Diagnosis not present

## 2023-01-11 DIAGNOSIS — E876 Hypokalemia: Secondary | ICD-10-CM | POA: Diagnosis present

## 2023-01-11 DIAGNOSIS — Z7982 Long term (current) use of aspirin: Secondary | ICD-10-CM | POA: Insufficient documentation

## 2023-01-11 DIAGNOSIS — Z79899 Other long term (current) drug therapy: Secondary | ICD-10-CM | POA: Insufficient documentation

## 2023-01-11 DIAGNOSIS — K219 Gastro-esophageal reflux disease without esophagitis: Secondary | ICD-10-CM | POA: Diagnosis present

## 2023-01-11 HISTORY — DX: Diverticulosis of intestine, part unspecified, without perforation or abscess without bleeding: K57.90

## 2023-01-11 LAB — COMPREHENSIVE METABOLIC PANEL
ALT: 12 U/L (ref 0–44)
AST: 12 U/L — ABNORMAL LOW (ref 15–41)
Albumin: 3.5 g/dL (ref 3.5–5.0)
Alkaline Phosphatase: 59 U/L (ref 38–126)
Anion gap: 12 (ref 5–15)
BUN: 8 mg/dL (ref 6–20)
CO2: 24 mmol/L (ref 22–32)
Calcium: 8.9 mg/dL (ref 8.9–10.3)
Chloride: 99 mmol/L (ref 98–111)
Creatinine, Ser: 0.66 mg/dL (ref 0.44–1.00)
GFR, Estimated: >60 mL/min (ref >60)
Glucose, Bld: 103 mg/dL — ABNORMAL HIGH (ref 70–99)
Potassium: 2.7 mmol/L — CL (ref 3.5–5.1)
Sodium: 135 mmol/L (ref 135–145)
Total Bilirubin: 0.5 mg/dL (ref 0.3–1.2)
Total Protein: 7.2 g/dL (ref 6.5–8.1)

## 2023-01-11 LAB — LIPASE, BLOOD: Lipase: 30 U/L (ref 11–51)

## 2023-01-11 LAB — CBC WITH DIFFERENTIAL/PLATELET
Abs Immature Granulocytes: 0.07 10*3/uL (ref 0.00–0.07)
Basophils Absolute: 0.1 10*3/uL (ref 0.0–0.1)
Basophils Relative: 1 %
Eosinophils Absolute: 0.1 10*3/uL (ref 0.0–0.5)
Eosinophils Relative: 1 %
HCT: 41.5 % (ref 36.0–46.0)
Hemoglobin: 13.7 g/dL (ref 12.0–15.0)
Immature Granulocytes: 0 %
Lymphocytes Relative: 16 %
Lymphs Abs: 3 10*3/uL (ref 0.7–4.0)
MCH: 29.2 pg (ref 26.0–34.0)
MCHC: 33 g/dL (ref 30.0–36.0)
MCV: 88.5 fL (ref 80.0–100.0)
Monocytes Absolute: 1.7 10*3/uL — ABNORMAL HIGH (ref 0.1–1.0)
Monocytes Relative: 9 %
Neutro Abs: 13.2 10*3/uL — ABNORMAL HIGH (ref 1.7–7.7)
Neutrophils Relative %: 73 %
Platelets: 401 10*3/uL — ABNORMAL HIGH (ref 150–400)
RBC: 4.69 MIL/uL (ref 3.87–5.11)
RDW: 15.7 % — ABNORMAL HIGH (ref 11.5–15.5)
WBC: 18 10*3/uL — ABNORMAL HIGH (ref 4.0–10.5)
nRBC: 0 % (ref 0.0–0.2)

## 2023-01-11 LAB — URINALYSIS, ROUTINE W REFLEX MICROSCOPIC
Bacteria, UA: NONE SEEN
Bilirubin Urine: NEGATIVE
Glucose, UA: NEGATIVE mg/dL
Ketones, ur: 5 mg/dL — AB
Leukocytes,Ua: NEGATIVE
Nitrite: NEGATIVE
Protein, ur: NEGATIVE mg/dL
Specific Gravity, Urine: 1.003 — ABNORMAL LOW (ref 1.005–1.030)
pH: 6 (ref 5.0–8.0)

## 2023-01-11 LAB — MAGNESIUM: Magnesium: 1.9 mg/dL (ref 1.7–2.4)

## 2023-01-11 LAB — HIV ANTIBODY (ROUTINE TESTING W REFLEX): HIV Screen 4th Generation wRfx: NONREACTIVE

## 2023-01-11 MED ORDER — METRONIDAZOLE 500 MG/100ML IV SOLN
500.0000 mg | Freq: Two times a day (BID) | INTRAVENOUS | Status: DC
  Filled 2023-01-11: qty 100

## 2023-01-11 MED ORDER — ONDANSETRON HCL 4 MG/2ML IJ SOLN
4.0000 mg | Freq: Once | INTRAMUSCULAR | Status: AC
  Administered 2023-01-11: 4 mg via INTRAVENOUS
  Filled 2023-01-11: qty 2

## 2023-01-11 MED ORDER — LACTATED RINGERS IV BOLUS
1000.0000 mL | Freq: Once | INTRAVENOUS | Status: AC
  Administered 2023-01-11: 1000 mL via INTRAVENOUS

## 2023-01-11 MED ORDER — PANTOPRAZOLE SODIUM 40 MG PO TBEC
40.0000 mg | DELAYED_RELEASE_TABLET | Freq: Every day | ORAL | Status: DC
  Administered 2023-01-11 – 2023-01-13 (×3): 40 mg via ORAL
  Filled 2023-01-11 (×3): qty 1

## 2023-01-11 MED ORDER — ONDANSETRON HCL 4 MG/2ML IJ SOLN: 4.0000 mg | Freq: Four times a day (QID) | INTRAMUSCULAR | Status: DC | PRN

## 2023-01-11 MED ORDER — IOHEXOL 300 MG/ML  SOLN
100.0000 mL | Freq: Once | INTRAMUSCULAR | Status: AC | PRN
  Administered 2023-01-11: 100 mL via INTRAVENOUS

## 2023-01-11 MED ORDER — SODIUM CHLORIDE (PF) 0.9 % IJ SOLN
INTRAMUSCULAR | Status: AC
  Filled 2023-01-11: qty 50

## 2023-01-11 MED ORDER — CIPROFLOXACIN IN D5W 400 MG/200ML IV SOLN
400.0000 mg | Freq: Once | INTRAVENOUS | Status: AC
  Administered 2023-01-11: 400 mg via INTRAVENOUS
  Filled 2023-01-11: qty 200

## 2023-01-11 MED ORDER — ONDANSETRON HCL 4 MG PO TABS: 4.0000 mg | ORAL_TABLET | Freq: Four times a day (QID) | ORAL | Status: DC | PRN

## 2023-01-11 MED ORDER — PIPERACILLIN-TAZOBACTAM 3.375 G IVPB 30 MIN
3.3750 g | Freq: Once | INTRAVENOUS | Status: AC
  Administered 2023-01-11: 3.375 g via INTRAVENOUS
  Filled 2023-01-11: qty 50

## 2023-01-11 MED ORDER — AMLODIPINE-OLMESARTAN 10-20 MG PO TABS: 1.0000 | ORAL_TABLET | Freq: Every day | ORAL | Status: DC

## 2023-01-11 MED ORDER — PIPERACILLIN-TAZOBACTAM 3.375 G IVPB
3.3750 g | Freq: Three times a day (TID) | INTRAVENOUS | Status: DC
  Administered 2023-01-11 – 2023-01-13 (×5): 3.375 g via INTRAVENOUS
  Filled 2023-01-11 (×6): qty 50

## 2023-01-11 MED ORDER — SODIUM CHLORIDE 0.9 % IV SOLN: INTRAVENOUS | Status: DC

## 2023-01-11 MED ORDER — MORPHINE SULFATE (PF) 2 MG/ML IV SOLN
2.0000 mg | INTRAVENOUS | Status: DC | PRN
  Administered 2023-01-11 (×2): 2 mg via INTRAVENOUS
  Filled 2023-01-11 (×2): qty 1

## 2023-01-11 MED ORDER — POTASSIUM CHLORIDE CRYS ER 20 MEQ PO TBCR
40.0000 meq | EXTENDED_RELEASE_TABLET | Freq: Two times a day (BID) | ORAL | Status: AC
  Administered 2023-01-11 (×2): 40 meq via ORAL
  Filled 2023-01-11 (×2): qty 2

## 2023-01-11 MED ORDER — FAMOTIDINE IN NACL 20-0.9 MG/50ML-% IV SOLN
20.0000 mg | Freq: Once | INTRAVENOUS | Status: AC
  Administered 2023-01-11: 20 mg via INTRAVENOUS
  Filled 2023-01-11: qty 50

## 2023-01-11 MED ORDER — POTASSIUM CHLORIDE 10 MEQ/100ML IV SOLN
10.0000 meq | INTRAVENOUS | Status: AC
  Administered 2023-01-11 (×5): 10 meq via INTRAVENOUS
  Filled 2023-01-11 (×3): qty 100

## 2023-01-11 MED ORDER — POLYETHYLENE GLYCOL 3350 17 G PO PACK
17.0000 g | PACK | Freq: Every day | ORAL | Status: DC
  Administered 2023-01-11 – 2023-01-12 (×2): 17 g via ORAL
  Filled 2023-01-11 (×2): qty 1

## 2023-01-11 MED ORDER — CYCLOBENZAPRINE HCL 10 MG PO TABS
10.0000 mg | ORAL_TABLET | Freq: Three times a day (TID) | ORAL | Status: DC | PRN
  Administered 2023-01-11: 10 mg via ORAL
  Filled 2023-01-11: qty 1

## 2023-01-11 MED ORDER — VITAMIN C 500 MG PO TABS
500.0000 mg | ORAL_TABLET | Freq: Every day | ORAL | Status: DC
  Administered 2023-01-11 – 2023-01-13 (×3): 500 mg via ORAL
  Filled 2023-01-11 (×3): qty 1

## 2023-01-11 MED ORDER — ACETAMINOPHEN 500 MG PO TABS
500.0000 mg | ORAL_TABLET | Freq: Four times a day (QID) | ORAL | Status: DC | PRN
  Administered 2023-01-11 – 2023-01-12 (×2): 500 mg via ORAL
  Filled 2023-01-11 (×2): qty 1

## 2023-01-11 MED ORDER — NICOTINE 21 MG/24HR TD PT24
21.0000 mg | MEDICATED_PATCH | Freq: Every day | TRANSDERMAL | Status: DC
  Filled 2023-01-11 (×3): qty 1

## 2023-01-11 MED ORDER — FENTANYL CITRATE PF 50 MCG/ML IJ SOSY
50.0000 ug | PREFILLED_SYRINGE | Freq: Once | INTRAMUSCULAR | Status: AC
  Administered 2023-01-11: 50 ug via INTRAVENOUS
  Filled 2023-01-11: qty 1

## 2023-01-11 NOTE — Assessment & Plan Note (Signed)
Continue PPI ?

## 2023-01-11 NOTE — H&P (Signed)
History and Physical    Patient: Cheryl Carlson ZOX:[id number] DOB: 12-Aug-1971 DOA: 01/11/2023 DOS: the patient was seen and examined on 01/11/2023 PCP: Tiffany Kocher, DO  Patient coming from: Home  Chief Complaint:  Chief Complaint  Patient presents with   Abdominal Pain   Constipation   HPI: Cheryl Carlson is a 52 y.o. female with medical history significant for hypertension, sigmoid diverticulosis, nicotine dependence, GERD who presents to the ER for evaluation of abdominal pain x 3 days. Patient states that she was in her usual state of health until the past 3 days prior to her admission when she developed abdominal pain mostly in the lower abdominal area rated an 8 x 10 in intensity at its worst, nonradiating and associated with nausea, vomiting and changes in her bowel habits which she describes as passage of small caliber stools. She denies having any fever, no chills, no chest pain, no dizziness, no light headedness, no SOB, no urinary symptoms, no palpitations, no headache, no blurred vision no focal deficit. Abnormal labs include potassium of 3.7, white count of 18.0 with a left shift CT scan of abdomen and pelvis shows mass-like mural thickening, mucosal hyperenhancement and subtle inflammatory changes associated with the sigmoid colon. There is a possibility that this could simply represent an acute diverticulitis, however, an underlying sigmoid neoplasm is strongly suspected. Further evaluation with colonoscopy should be considered in the near future to evaluate for potential colorectal neoplasm if clinically appropriate. Trace volume of ascites. Nonobstructive nephrolithiasis in the collecting systems of both kidneys (right-greater-than-left) measuring 1-3 mm in size. Aortic atherosclerosis. Patient received potassium supplements, a dose of ciprofloxacin and Flagyl and will be admitted to the hospital for further evaluation    Review of Systems: As mentioned in the history of  present illness. All other systems reviewed and are negative. Past Medical History:  Diagnosis Date   Diverticulosis sigmoid colon    2021   GERD (gastroesophageal reflux disease)    Hypertension    Mitral valve prolapse    Past Surgical History:  Procedure Laterality Date   APPENDECTOMY     UPPER GASTROINTESTINAL ENDOSCOPY     uterine tumor removal  2011   Social History:  reports that she has been smoking cigarettes. She has been smoking an average of .5 packs per day. She has never used smokeless tobacco. She reports current alcohol use. She reports that she does not use drugs.  No Known Allergies  Family History  Problem Relation Age of Onset   Heart disease Mother    Hypertension Mother    Diabetes Mother    Hypertension Father    Heart attack Brother 40   Diabetes Maternal Grandmother    Heart attack Brother    Stomach cancer Neg Hx    Esophageal cancer Neg Hx    Colon cancer Neg Hx    Rectal cancer Neg Hx    Thyroid disease Neg Hx     Prior to Admission medications   Medication Sig Start Date End Date Taking? Authorizing Provider  acetaminophen (TYLENOL) 500 MG tablet Take 500 mg by mouth every 6 (six) hours as needed for moderate pain.   Yes [provider]  amlodipine-olmesartan (AZOR) 10-20 MG tablet Take 1 tablet by mouth daily. 07/26/22  Yes Tiffany Kocher, DO  Ascorbic Acid (VITAMIN C PO) Take by mouth daily.   Yes [provider]  aspirin EC 81 MG tablet Take 81 mg by mouth daily.   Yes [provider]  cyclobenzaprine (FLEXERIL) 10 MG tablet Take 10 mg by mouth 3 (three) times daily as needed for muscle spasms. 05/24/22  Yes [provider]  ELDERBERRY PO Take 1 tablet by mouth daily.   Yes [provider]  Multiple Vitamins-Minerals (HAIR/SKIN/NAILS) TABS Take 1 tablet by mouth daily.    Yes [provider]  NON FORMULARY Sea moss daily   Yes [provider]  NON FORMULARY Super beets daily    Yes [provider]  omeprazole (PRILOSEC) 40 MG capsule TAKE 1 CAPSULE(40 MG) BY MOUTH EVERY MORNING 30 MINUTES BEFORE BREAKFAST 07/26/22  Yes Iva Boop, MD  amoxicillin-clavulanate (AUGMENTIN) 875-125 MG tablet Take 1 tablet by mouth 2 (two) times daily. Patient not taking: Reported on 04/26/2022 09/22/20   Mirian Mo, MD  baclofen (LIORESAL) 10 MG tablet TAKE 1 TABLET(10 MG) BY MOUTH THREE TIMES DAILY AS NEEDED FOR MUSCLE SPASM. Will need appt for refills. Patient not taking: Reported on 01/11/2023 01/07/22   Shirlean Mylar, MD  diclofenac Sodium (VOLTAREN) 1 % GEL Apply 4 g topically 4 (four) times daily. Patient not taking: Reported on 01/11/2023 03/18/21   Shirlean Mylar, MD  naproxen (NAPROSYN) 500 MG tablet Take 1 tablet two times per day as needed for pain Patient not taking: Reported on 01/11/2023 11/28/19   Myrene Buddy, MD  rosuvastatin (CRESTOR) 20 MG tablet Take 1 tablet (20 mg total) by mouth daily. Patient not taking: Reported on 01/11/2023 05/02/22   Tiffany Kocher, DO    Physical Exam: Vitals:   01/11/23 0215 01/11/23 0415 01/11/23 0515 01/11/23 0554  BP: 120/85  110/73   Pulse:  98 95   Resp:   18   Temp:    98.4 F (36.9 C)  TempSrc:    Oral  SpO2:  96% 94%    Physical Exam Vitals and nursing note reviewed.  Constitutional:      Appearance: She is well-developed.  HENT:     Head: Normocephalic.  Eyes:     Extraocular Movements: Extraocular movements intact.  Cardiovascular:     Rate and Rhythm: Normal rate and regular rhythm.  Pulmonary:     Effort: Pulmonary effort is normal.     Breath sounds: Normal breath sounds.  Abdominal:     General: Abdomen is flat. Bowel sounds are normal.     Palpations: Abdomen is soft.     Tenderness: There is abdominal tenderness.     Comments: Tenderness in the lower quadrants and peri umbilical area  Skin:    General: Skin is warm and dry.  Neurological:     General: No focal deficit present.     Mental  Status: She is alert.  Psychiatric:        Mood and Affect: Mood normal.        Behavior: Behavior normal.     Data Reviewed: Relevant notes from primary care and specialist visits, past discharge summaries as available in EHR, including Care Everywhere. Prior diagnostic testing as pertinent to current admission diagnoses Updated medications and problem lists for reconciliation ED course, including vitals, labs, imaging, treatment and response to treatment Triage notes, nursing and pharmacy notes and ED provider's notes Notable results as noted in HPI Labs reviewed.  Sodium 135, potassium 2.7, chloride 99, bicarb 24, glucose 103, BUN 8, creatinine 0.66, calcium 8.9, total protein 7.2, albumin 3.5, AST 12, ALT 12, alkaline phosphatase 59, lipase 30, white count 18.0, hemoglobin 13.7, hematocrit 41.5, platelet count 401 There are no new results to  review at this time.  Assessment and Plan: * Sigmoid diverticulitis Patient with a known history of sigmoid diverticulosis who presents to the ER for evaluation of a 3-day history of abdominal pain mostly in the lower quadrants associated with nausea, vomiting and constipation. Imaging is suggestive of acute diverticulitis, however, an underlying sigmoid neoplasm is strongly suspected. Further evaluation with colonoscopy has been recommended. Will treat patient empirically with Zosyn Will request GI consult   Hypokalemia Secondary to GI losses from nausea and vomiting Supplement potassium Check magnesium levels  Tobacco abuse Smoking cessation has been discussed with patient in detail Will place patient on a nicotine transdermal patch 21mg  daily  GERD (gastroesophageal reflux disease) Continue PPI  Essential hypertension, benign Hold antihypertensive medication since patient is normotensive      Advance Care Planning:   Code Status: Full Code   Consults: Gastroenterology  Family Communication: Greater than 50% of time was spent  discussing patient's condition and plan of care with her at the bedside.  All questions and concerns have been addressed.  She verbalizes understanding and agrees with the plan.  Severity of Illness: The appropriate patient status for this patient is INPATIENT. Inpatient status is judged to be reasonable and necessary in order to provide the required intensity of service to ensure the patient's safety. The patient's presenting symptoms, physical exam findings, and initial radiographic and laboratory data in the context of their chronic comorbidities is felt to place them at high risk for further clinical deterioration. Furthermore, it is not anticipated that the patient will be medically stable for discharge from the hospital within 2 midnights of admission.   * I certify that at the point of admission it is my clinical judgment that the patient will require inpatient hospital care spanning beyond 2 midnights from the point of admission due to high intensity of service, high risk for further deterioration and high frequency of surveillance required.*  Author: Lucile Shutters, MD 01/11/2023 9:02 AM  For on call review [email or web address].

## 2023-01-11 NOTE — Progress Notes (Signed)
Pharmacy Antibiotic Note  Cheryl Carlson is a 52 y.o. female admitted on 01/11/2023 with diverticulitis.  Pharmacy has been consulted for Zosyn dosing.    Plan: Zosyn 3.375g x 1 dose (30 minute infusion), then Zosyn 3.375g IV q8h (4 hour infusion).  Height: 5\' 2"  (157.5 cm) Weight: 63 kg (139 lb) IBW/kg (Calculated) : 50.1  Temp (24hrs), Avg:98.6 F (37 C), Min:98.4 F (36.9 C), Max:98.8 F (37.1 C)  Recent Labs  Lab 01/11/23 0230  WBC 18.0*  CREATININE 0.66    Estimated Creatinine Clearance: 71 mL/min (by C-G formula based on SCr of 0.66 mg/dL).    No Known Allergies  Antimicrobials this admission: 5/1 Cipro x 1 dose 5/1 Zosyn  >>    Microbiology results: None  Thank you for allowing pharmacy to be a part of this patient's care.  Kameka Whan Tylene Fantasia 01/11/2023 10:56 AM

## 2023-01-11 NOTE — Consult Note (Addendum)
Referring Provider:  [facility] Primary Care Physician:  Tiffany Kocher, DO Primary Gastroenterologist:  Dr. Leone Payor  Reason for Consultation: Abnormal CT scan  HPI: Cheryl Carlson is a 52 y.o. female with medical history significant for hypertension, sigmoid diverticulosis, nicotine dependence, GERD who presents to the ER for evaluation of abdominal pain x 3 days. Patient states that she ran out of her reflux medication so has been experiencing symptoms with that for the past week or so with burning in chest, nausea, etc.  Then for the past 3 days she's developed some lower abdominal pain with nausea and episode of vomiting and inability to pass a good stool.  Says that her last normal BM was Saturday and now she is just passing liquid "squirts".  No rectal bleeding.  Prior to all of this she was doing well.  CT scan of the abdomen and pelvis with contrast:  IMPRESSION: 1. Mass-like mural thickening, mucosal hyperenhancement and subtle inflammatory changes associated with the sigmoid colon. There is a possibility that this could simply represent an acute diverticulitis, however, an underlying sigmoid neoplasm is strongly suspected. Further evaluation with colonoscopy should be considered in the near future to evaluate for potential colorectal neoplasm if clinically appropriate. 2. Trace volume of ascites. 3. Nonobstructive nephrolithiasis in the collecting systems of both kidneys (right-greater-than-left) measuring 1-3 mm in size. 4. Aortic atherosclerosis.  WBC count is 18K.  K+ is 2.7.  Received a dose of IV cipro and now is on IV zosyn and flagyl.  Says that she feels like she just needs to take a good BM.  Colonoscopy 10/2019 with diverticulosis and 1 small polyp that was removed was hyperplastic with recall recommended at 10-year interval.   Past Medical History:  Diagnosis Date   Diverticulosis sigmoid colon    2021   GERD (gastroesophageal reflux disease)    Hypertension     Mitral valve prolapse     Past Surgical History:  Procedure Laterality Date   APPENDECTOMY     UPPER GASTROINTESTINAL ENDOSCOPY     uterine tumor removal  2011    Prior to Admission medications   Medication Sig Start Date End Date Taking? Authorizing Provider  acetaminophen (TYLENOL) 500 MG tablet Take 500 mg by mouth every 6 (six) hours as needed for moderate pain.   Yes [provider]  amlodipine-olmesartan (AZOR) 10-20 MG tablet Take 1 tablet by mouth daily. 07/26/22  Yes Tiffany Kocher, DO  Ascorbic Acid (VITAMIN C PO) Take by mouth daily.   Yes [provider]  aspirin EC 81 MG tablet Take 81 mg by mouth daily.   Yes [provider]  cyclobenzaprine (FLEXERIL) 10 MG tablet Take 10 mg by mouth 3 (three) times daily as needed for muscle spasms. 05/24/22  Yes [provider]  ELDERBERRY PO Take 1 tablet by mouth daily.   Yes [provider]  Multiple Vitamins-Minerals (HAIR/SKIN/NAILS) TABS Take 1 tablet by mouth daily.    Yes [provider]  NON FORMULARY Sea moss daily   Yes [provider]  NON FORMULARY Super beets daily   Yes [provider]  omeprazole (PRILOSEC) 40 MG capsule TAKE 1 CAPSULE(40 MG) BY MOUTH EVERY MORNING 30 MINUTES BEFORE BREAKFAST 07/26/22  Yes Iva Boop, MD  amoxicillin-clavulanate (AUGMENTIN) 875-125 MG tablet Take 1 tablet by mouth 2 (two) times daily. Patient not taking: Reported on 04/26/2022 09/22/20   Mirian Mo, MD  baclofen (LIORESAL) 10 MG tablet TAKE 1 TABLET(10 MG) BY MOUTH  THREE TIMES DAILY AS NEEDED FOR MUSCLE SPASM. Will need appt for refills. Patient not taking: Reported on 01/11/2023 01/07/22   Shirlean Mylar, MD  diclofenac Sodium (VOLTAREN) 1 % GEL Apply 4 g topically 4 (four) times daily. Patient not taking: Reported on 01/11/2023 03/18/21   Shirlean Mylar, MD  naproxen (NAPROSYN) 500 MG tablet Take 1 tablet two times per day as needed for pain Patient not taking:  Reported on 01/11/2023 11/28/19   Myrene Buddy, MD  rosuvastatin (CRESTOR) 20 MG tablet Take 1 tablet (20 mg total) by mouth daily. Patient not taking: Reported on 01/11/2023 05/02/22   Tiffany Kocher, DO    Current Facility-Administered Medications  Medication Dose Route Frequency Provider Last Rate Last Admin   0.9 %  sodium chloride infusion   Intravenous Continuous Agbata, Tochukwu, MD       acetaminophen (TYLENOL) tablet 500 mg  500 mg Oral Q6H PRN Agbata, Tochukwu, MD       ascorbic acid (VITAMIN C) tablet 500 mg  500 mg Oral Daily Agbata, Tochukwu, MD       cyclobenzaprine (FLEXERIL) tablet 10 mg  10 mg Oral TID PRN Agbata, Tochukwu, MD       metroNIDAZOLE (FLAGYL) IVPB 500 mg  500 mg Intravenous Q12H Roxy Horseman, PA-C   Held at 01/11/23 1610   morphine (PF) 2 MG/ML injection 2 mg  2 mg Intravenous Q4H PRN Agbata, Tochukwu, MD       nicotine (NICODERM CQ - dosed in mg/24 hours) patch 21 mg  21 mg Transdermal Daily Agbata, Tochukwu, MD       ondansetron (ZOFRAN) tablet 4 mg  4 mg Oral Q6H PRN Agbata, Tochukwu, MD       Or   ondansetron (ZOFRAN) injection 4 mg  4 mg Intravenous Q6H PRN Agbata, Tochukwu, MD       pantoprazole (PROTONIX) EC tablet 40 mg  40 mg Oral Daily Agbata, Tochukwu, MD       piperacillin-tazobactam (ZOSYN) IVPB 3.375 g  3.375 g Intravenous Once Agbata, Tochukwu, MD       piperacillin-tazobactam (ZOSYN) IVPB 3.375 g  3.375 g Intravenous Q8H Agbata, Tochukwu, MD       potassium chloride SA (KLOR-CON M) CR tablet 40 mEq  40 mEq Oral BID Agbata, Tochukwu, MD        Allergies as of 01/11/2023   (No Known Allergies)    Family History  Problem Relation Age of Onset   Heart disease Mother    Hypertension Mother    Diabetes Mother    Hypertension Father    Heart attack Brother 83   Diabetes Maternal Grandmother    Heart attack Brother    Stomach cancer Neg Hx    Esophageal cancer Neg Hx    Colon cancer Neg Hx    Rectal cancer Neg Hx    Thyroid disease Neg  Hx     Social History   Socioeconomic History   Marital status: Legally Separated    Spouse name: Not on file   Number of children: 1   Years of education: Not on file   Highest education level: Not on file  Occupational History   Occupation: Cusodial  Tobacco Use   Smoking status: Every Day    Packs/day: .5    Types: Cigarettes   Smokeless tobacco: Never  Vaping Use   Vaping Use: Never used  Substance and Sexual Activity   Alcohol use: Yes    Comment: socially   Drug use: No  Sexual activity: Not Currently  Other Topics Concern   Not on file  Social History Narrative   Not on file   Social Determinants of Health   Financial Resource Strain: Not on file  Food Insecurity: Not on file  Transportation Needs: Not on file  Physical Activity: Not on file  Stress: Not on file  Social Connections: Not on file  Intimate Partner Violence: Not on file    Review of Systems: ROS is O/W negative except as mentioned in HPI.  Physical Exam: Vital signs in last 24 hours: Temp:  [98.4 F (36.9 C)-98.8 F (37.1 C)] 98.4 F (36.9 C) (05/01 0554) Pulse Rate:  [95-125] 95 (05/01 0515) Resp:  [18] 18 (05/01 0515) BP: (110-124)/(73-85) 110/73 (05/01 0515) SpO2:  [94 %-97 %] 94 % (05/01 0515)   General:  Alert, Well-developed, well-nourished, pleasant and cooperative in NAD Head:  Normocephalic and atraumatic. Eyes:  Sclera clear, no icterus.  Conjunctiva pink. Ears:  Normal auditory acuity. Mouth:  No deformity or lesions.  Lungs:  Clear throughout to auscultation.  No wheezes, crackles, or rhonchi.  Heart:  Regular rate and rhythm; no murmurs, clicks, rubs, or gallops. Abdomen:  Soft, non-distended.  BS present.  Lower abdominal TTP> on the left.   Msk:  Symmetrical without gross deformities. Pulses:  Normal pulses noted. Extremities:  Without clubbing or edema. Neurologic:  Alert and oriented x 4;  grossly normal neurologically. Skin:  Intact without significant lesions  or rashes. Psych:  Alert and cooperative. Normal mood and affect.  Intake/Output from previous day: 04/30 0701 - 05/01 0700 In: 100 [IV Piggyback:100] Out: -   Lab Results: Recent Labs    01/11/23 0230  WBC 18.0*  HGB 13.7  HCT 41.5  PLT 401*   BMET Recent Labs    01/11/23 0230  NA 135  K 2.7*  CL 99  CO2 24  GLUCOSE 103*  BUN 8  CREATININE 0.66  CALCIUM 8.9   LFT Recent Labs    01/11/23 0230  PROT 7.2  ALBUMIN 3.5  AST 12*  ALT 12  ALKPHOS 59  BILITOT 0.5   Studies/Results: CT ABDOMEN PELVIS W CONTRAST  Result Date: 01/11/2023 CLINICAL DATA:  52 year old female with history of suspected bowel obstruction. EXAM: CT ABDOMEN AND PELVIS WITH CONTRAST TECHNIQUE: Multidetector CT imaging of the abdomen and pelvis was performed using the standard protocol following bolus administration of intravenous contrast. RADIATION DOSE REDUCTION: This exam was performed according to the departmental dose-optimization program which includes automated exposure control, adjustment of the mA and/or kV according to patient size and/or use of iterative reconstruction technique. CONTRAST:  OMNIPAQUE IOHEXOL 300 MG/ML  SOLN COMPARISON:  CT of the abdomen and pelvis 05/02/2012. FINDINGS: Lower chest: Mild scarring in the lower lobes of the lungs bilaterally. Hepatobiliary: No suspicious cystic or solid hepatic lesions. No intra or extrahepatic biliary ductal dilatation. Gallbladder is unremarkable in appearance. Pancreas: No pancreatic mass. No pancreatic ductal dilatation. No pancreatic or peripancreatic fluid collections or inflammatory changes. Spleen: Unremarkable. Adrenals/Urinary Tract: Multiple nonobstructive calculi are noted within the collecting systems of both kidneys (right greater than left), measuring 1-3 mm in size. Bilateral kidneys and adrenal glands are otherwise normal in appearance. No hydroureteronephrosis. Urinary bladder is nearly completely decompressed, but otherwise  unremarkable in appearance. Stomach/Bowel: The appearance of the stomach is unremarkable. No pathologic dilatation of small bowel or colon. In the region of the sigmoid colon there is mass-like area of mural thickening with some mucosal hyperenhancement  and subtle surrounding inflammatory changes best appreciated on axial image 57 of series 2. There is a diverticulum in this region, and it is possible that these changes could all be related to acute diverticulitis, however, the extent of mural thickening is greater than typically seen, and the overall appearance is concerning for potential neoplasm, estimated to measure approximately 6.3 x 3.8 x 3.7 cm (axial image 57 of series 2 and coronal image 58 of series 6). Status post appendectomy. Vascular/Lymphatic: Atherosclerotic calcifications in the abdominal aorta and pelvic vasculature. No lymphadenopathy noted in the abdomen or pelvis. Reproductive: Uterus is heterogeneous in appearance with multiple heterogeneously enhancing lesions measuring up to 3.5 cm in diameter in the right-side of the fundal region, presumably multifocal fibroids. Ovaries are unremarkable in appearance. Other: Trace volume of free fluid in the low anatomic pelvis. No pneumoperitoneum. Musculoskeletal: There are no aggressive appearing lytic or blastic lesions noted in the visualized portions of the skeleton. IMPRESSION: 1. Mass-like mural thickening, mucosal hyperenhancement and subtle inflammatory changes associated with the sigmoid colon. There is a possibility that this could simply represent an acute diverticulitis, however, an underlying sigmoid neoplasm is strongly suspected. Further evaluation with colonoscopy should be considered in the near future to evaluate for potential colorectal neoplasm if clinically appropriate. 2. Trace volume of ascites. 3. Nonobstructive nephrolithiasis in the collecting systems of both kidneys (right-greater-than-left) measuring 1-3 mm in size. 4. Aortic  atherosclerosis. Electronically Signed   By: Trudie Reed M.D.   On: 01/11/2023 05:32    IMPRESSION:  *52 year old female with complaints of abdominal pain, nausea, vomiting, and inability to have a bowel movement for several days.  Initially thought that her symptoms were due to reflux as she was out of her medication for several days.  CT scan suggests sigmoid mass versus diverticulitis.  She does have a leukocytosis.  Has been started on IV Zosyn and Flagyl.  Had a colonoscopy in February 2021 with only 1 small hyperplastic polyp removed-cleared for 10 year recall. *Leukocytosis with white blood cell count of 18 K. *Hypokalemia with potassium 2.7.  PLAN: -Continue antibiotics for now. -Timing for colonoscopy TBD, inpatient versus outpatient in several weeks after completion of antibiotics for possible diverticulitis. -Will add Miralax daily to help her move her bowels. -K+ correction by hospitalist service.   Princella Pellegrini. Zehr  01/11/2023, 8:53 AM   Attending physician's note   I have taken history, reviewed the chart and examined the patient. I performed a substantive portion of this encounter, including complete performance of at least one of the key components, in conjunction with the APP. I agree with the Advanced Practitioner's note, impression and recommendations.   Acute sig diverticulitis with leukocytosis. Doubt sig mass (as suggested by radiology). No bowel obs. Neg colon Feb 2021 except for sig div (good prep).  CT reviewed independently. No bleeding.  GERD with neg EGD 10/2018. S/P dil 54Fr. No further dysphagia.  Plan: -IV Zosyn -Advance diet to FLD in AM, then advance as tolerated (currently on clears) -Protonix 40 mg PO QD (has been on omeprazole 40 QD as outpt) -Trend CBC, CMP. Keep K>4 -Check CEA. -FU GI as outpt in 2-3 weeks. If with persistent Abdo pain, rpt CT AP (to r/o abscess) as outpt -Recommend colon +/- EGD as outpt in 6-8 weeks once diverticulitis has  resolved.  At this time, endo procedures may be detrimental, unless active bleeding. -D/W in detail with pt. She will FU with Dr Leone Payor.   Edman Circle, MD [facility]  [facility] [phone]

## 2023-01-11 NOTE — ED Provider Notes (Signed)
[facility]-EMERGENCY DEPT [facility] Emergency Department Provider Note MRN:  [id number]  Arrival date & time: 01/11/23     Chief Complaint   Abdominal Pain and Constipation   History of Present Illness   Cheryl Carlson is a 52 y.o. year-old female presents to the ED with chief complaint of abdominal pain.  Reports not having and BMs or being able to pass gas.  She has had some vomiting.  Prior abdominal surgery notable for appendectomy.  She denies measured temperature.    Additionally, she reports that she has been out of her acid reflux medicine and thinks this may be contributing to her pain.  History provided by patient.   Review of Systems  Pertinent positive and negative review of systems noted in HPI.    Physical Exam   Vitals:   01/11/23 0515 01/11/23 0554  BP: 110/73   Pulse: 95   Resp: 18   Temp:  98.4 F (36.9 C)  SpO2: 94%     CONSTITUTIONAL:  non toxic-appearing, NAD NEURO:  Alert and oriented x 3, CN 3-12 grossly intact EYES:  eyes equal and reactive ENT/NECK:  Supple, no stridor  CARDIO:  tachycardic, regular rhythm, appears well-perfused  PULM:  No respiratory distress GI/GU:  non-distended, LLQ TTP MSK/SPINE:  No gross deformities, no edema, moves all extremities  SKIN:  no rash, atraumatic   *Additional and/or pertinent findings included in MDM below  Diagnostic and Interventional Summary    EKG Interpretation  Date/Time:    Ventricular Rate:    PR Interval:    QRS Duration:   QT Interval:    QTC Calculation:   R Axis:     Text Interpretation:         Labs Reviewed  COMPREHENSIVE METABOLIC PANEL - Abnormal; Notable for the following components:      Result Value   Potassium 2.7 (*)    Glucose, Bld 103 (*)    AST 12 (*)    All other components within normal limits  CBC WITH DIFFERENTIAL/PLATELET - Abnormal; Notable for the following components:   WBC 18.0 (*)    RDW 15.7 (*)    Platelets 401 (*)    Neutro Abs 13.2 (*)     Monocytes Absolute 1.7 (*)    All other components within normal limits  URINALYSIS, ROUTINE W REFLEX MICROSCOPIC - Abnormal; Notable for the following components:   Specific Gravity, Urine 1.003 (*)    Hgb urine dipstick MODERATE (*)    Ketones, ur 5 (*)    All other components within normal limits  LIPASE, BLOOD    CT ABDOMEN PELVIS W CONTRAST  Final Result      Medications  potassium chloride 10 mEq in 100 mL IVPB (0 mEq Intravenous Stopped 01/11/23 0600)  ciprofloxacin (CIPRO) IVPB 400 mg (has no administration in time range)  metroNIDAZOLE (FLAGYL) IVPB 500 mg (has no administration in time range)  lactated ringers bolus 1,000 mL (0 mLs Intravenous Stopped 01/11/23 0330)  fentaNYL (SUBLIMAZE) injection 50 mcg (50 mcg Intravenous Given 01/11/23 0228)  ondansetron (ZOFRAN) injection 4 mg (4 mg Intravenous Given 01/11/23 0228)  famotidine (PEPCID) IVPB 20 mg premix (0 mg Intravenous Stopped 01/11/23 0300)  iohexol (OMNIPAQUE) 300 MG/ML solution 100 mL (100 mLs Intravenous Contrast Given 01/11/23 0459)     Procedures  /  Critical Care Procedures  ED Course and Medical Decision Making  I have reviewed the triage vital signs, the nursing notes, and pertinent available records from the EMR.  Social Determinants Affecting Complexity of Care: Patient has no clinically significant social determinants affecting this chief complaint..   ED Course:    Medical Decision Making Patient here with lower abdominal pain.  Having difficulties defalcating.  Hx of prior appy.  Will check CT to rule out SBO.  Patient reports last colonoscopy was a couple of years ago and showed a single non-cancerous polyp.  CT shows diverticulitis vs neoplasm and mass.  I'm more suspicious of infection given her workup and symptoms today.  Amount and/or Complexity of Data Reviewed Labs: ordered.    Details: Hypokalemia to 2.7 WBC 18, concern for infection Radiology: ordered.  Risk Prescription drug  management. Decision regarding hospitalization.     Consultants: I consulted with Hospitalist, Dr. Julian Reil, who is appreciated for admitting.   Treatment and Plan: Patient's exam and diagnostic results are concerning for diverticulitis vs neoplasm.  Feel that patient will need admission to the hospital for further treatment and evaluation.    Final Clinical Impressions(s) / ED Diagnoses     ICD-10-CM   1. Lower abdominal pain  R10.30     2. Diverticulitis  K57.92     3. Abdominal mass, unspecified abdominal location  R19.00     4. Hypokalemia  E87.6       ED Discharge Orders     None         Discharge Instructions Discussed with and Provided to Patient:   Discharge Instructions   None      Roxy Horseman, PA-C 01/11/23 1610    Paula Libra, MD 01/11/23 [phone]

## 2023-01-11 NOTE — ED Triage Notes (Addendum)
Pt arrived POV for vomiting since Saturday, unable to have a BM or pass gas since Saturday. Pt reports has tried OTC stool softeners w/o relief and used preporation-H and soaked in Epson salt bath with no improvement. Reports acid reflux, has been out of her regular medication Prilosec. Abd soft, but tender to touch, does not appear distended.

## 2023-01-11 NOTE — Assessment & Plan Note (Signed)
Smoking cessation has been discussed with patient in detail Will place patient on a nicotine transdermal patch 21mg  daily

## 2023-01-11 NOTE — Plan of Care (Signed)

## 2023-01-11 NOTE — Assessment & Plan Note (Signed)
Secondary to GI losses from nausea and vomiting Supplement potassium Check magnesium levels 

## 2023-01-11 NOTE — Assessment & Plan Note (Signed)
Hold antihypertensive medication since patient is normotensive

## 2023-01-11 NOTE — TOC CM/SW Note (Signed)
  Transition of Care (TOC) Screening Note   Patient Details  Name: Cheryl Carlson Date of Birth: August 16, 1971   Transition of Care (TOC) CM/SW Contact:    Armanda Heritage, RN Phone Number: 01/11/2023, 3:05 PM    Transition of Care Department [facility]) has reviewed patient and no TOC needs have been identified at this time. We will continue to monitor patient advancement through interdisciplinary progression rounds. If new patient transition needs arise, please place a TOC consult.

## 2023-01-11 NOTE — Assessment & Plan Note (Signed)
Patient with a known history of sigmoid diverticulosis who presents to the ER for evaluation of a 3-day history of abdominal pain mostly in the lower quadrants associated with nausea, vomiting and constipation. Imaging is suggestive of acute diverticulitis, however, an underlying sigmoid neoplasm is strongly suspected. Further evaluation with colonoscopy has been recommended. Will treat patient empirically with Zosyn Will request GI consult

## 2023-01-12 ENCOUNTER — Telehealth: Payer: Self-pay

## 2023-01-12 DIAGNOSIS — D72829 Elevated white blood cell count, unspecified: Secondary | ICD-10-CM | POA: Diagnosis not present

## 2023-01-12 DIAGNOSIS — K219 Gastro-esophageal reflux disease without esophagitis: Secondary | ICD-10-CM

## 2023-01-12 DIAGNOSIS — K5732 Diverticulitis of large intestine without perforation or abscess without bleeding: Secondary | ICD-10-CM | POA: Diagnosis not present

## 2023-01-12 LAB — BASIC METABOLIC PANEL
Anion gap: 8 (ref 5–15)
BUN: <5 mg/dL — ABNORMAL LOW (ref 6–20)
CO2: 29 mmol/L (ref 22–32)
Calcium: 9 mg/dL (ref 8.9–10.3)
Chloride: 102 mmol/L (ref 98–111)
Creatinine, Ser: 0.62 mg/dL (ref 0.44–1.00)
GFR, Estimated: >60 mL/min (ref >60)
Glucose, Bld: 86 mg/dL (ref 70–99)
Potassium: 4.3 mmol/L (ref 3.5–5.1)
Sodium: 139 mmol/L (ref 135–145)

## 2023-01-12 LAB — CBC
HCT: 40.2 % (ref 36.0–46.0)
Hemoglobin: 12.8 g/dL (ref 12.0–15.0)
MCH: 28.7 pg (ref 26.0–34.0)
MCHC: 31.8 g/dL (ref 30.0–36.0)
MCV: 90.1 fL (ref 80.0–100.0)
Platelets: 419 10*3/uL — ABNORMAL HIGH (ref 150–400)
RBC: 4.46 MIL/uL (ref 3.87–5.11)
RDW: 15.5 % (ref 11.5–15.5)
WBC: 11.1 10*3/uL — ABNORMAL HIGH (ref 4.0–10.5)
nRBC: 0 % (ref 0.0–0.2)

## 2023-01-12 MED ORDER — AMLODIPINE BESYLATE 5 MG PO TABS
5.0000 mg | ORAL_TABLET | Freq: Every day | ORAL | Status: DC
  Administered 2023-01-12 – 2023-01-13 (×2): 5 mg via ORAL
  Filled 2023-01-12 (×2): qty 1

## 2023-01-12 NOTE — Progress Notes (Signed)
PROGRESS NOTE    Cheryl Carlson  ZJI:[id number] DOB: 10/02/1970 DOA: 01/11/2023 PCP: Tiffany Kocher, DO   Brief Narrative: 52 year old with past medical history significant for hypertension, sigmoid diverticulosis, nicotine dependence, GERD presented to the ER complaining of abdominal pain for 3 days.  Reported abdominal pain lower quadrant 8 out of 10 associated with nausea and vomiting and changes in her bowel habits.  Small caliber stool.  Evaluation in the ED she was found to have masslike moral thickening, mucosal hyperenhancement and subtle inflammatory changes associated with the sigmoid colon.  Could represent acute diverticulitis or underlying sigmoid neoplasm and.  She was admitted for further evaluation, started on IV fluids and IV antibiotics.  Admitted for acute diverticulitis.   Assessment & Plan:   Principal Problem:   Sigmoid diverticulitis Active Problems:   Hypokalemia   Essential hypertension, benign   GERD (gastroesophageal reflux disease)   Tobacco abuse   Left lower quadrant abdominal pain  1-Acute Sigmoid Diverticulitis;  Patient presented with abdominal pain, leukocytosis, CT scan with finding consistent with diverticulitis versus underlying mass. -GI has been consulted and recommend treatment for diverticulitis, close follow-up in the office for repeat CT scan and or colonoscopy in 3 weeks -Abdominal pain improved, white blood cell count trending down -Started on Full liquid diet.   2-Hypokalemia;  Replaced.   Tobacco abuse; counseling.   GERD; PPI  HTN; [employer].    Estimated body mass index is 25.42 kg/m as calculated from the following:   Height as of this encounter: 5\' 2"  (1.575 m).   Weight as of this encounter: 63 kg.   DVT prophylaxis: SCD Code Status: Full code Family Communication: Care discussed with patient.  Disposition Plan:  Status is: Observation The patient remains OBS appropriate and will d/c before 2  midnights.    Consultants:  GI  Procedures:    Antimicrobials:    Subjective: She report improvement of abdominal pain. Report feeling she will have BM.  Tolerated full liquids.    Objective: Vitals:   01/12/23 0200 01/12/23 0509 01/12/23 0953 01/12/23 1323  BP: (!) 147/96 121/85 (!) 149/95 (!) 141/98  Pulse: 81 75 84 85  Resp: 18 18 17 17   Temp: 97.9 F (36.6 C) 98.1 F (36.7 C) 97.8 F (36.6 C) 97.8 F (36.6 C)  TempSrc: Oral Oral Oral Oral  SpO2: 96% 99% 100% 100%  Weight:      Height:        Intake/Output Summary (Last 24 hours) at 01/12/2023 1400 Last data filed at 01/12/2023 1326 Gross per 24 hour  Intake 3287.07 ml  Output --  Net 3287.07 ml   Filed Weights   01/11/23 1000  Weight: 63 kg    Examination:  General exam: Appears calm and comfortable  Respiratory system: Clear to auscultation. Respiratory effort normal. Cardiovascular system: S1 & S2 heard, RRR. No JVD, murmurs, rubs, gallops or clicks. No pedal edema. Gastrointestinal system: Abdomen is nondistended, soft and nontender.  Central nervous system: Alert and oriented.  Extremities: Symmetric 5 x 5 power.   Data Reviewed: I have personally reviewed following labs and imaging studies  CBC: Recent Labs  Lab 01/11/23 0230 01/12/23 0452  WBC 18.0* 11.1*  NEUTROABS 13.2*  --   HGB 13.7 12.8  HCT 41.5 40.2  MCV 88.5 90.1  PLT 401* 419*   Basic Metabolic Panel: Recent Labs  Lab 01/11/23 0230 01/11/23 0905 01/12/23 0452  NA 135  --  139  K 2.7*  --  4.3  CL 99  --  102  CO2 24  --  29  GLUCOSE 103*  --  86  BUN 8  --  <5*  CREATININE 0.66  --  0.62  CALCIUM 8.9  --  9.0  MG  --  1.9  --    GFR: Estimated Creatinine Clearance: 71 mL/min (by C-G formula based on SCr of 0.62 mg/dL). Liver Function Tests: Recent Labs  Lab 01/11/23 0230  AST 12*  ALT 12  ALKPHOS 59  BILITOT 0.5  PROT 7.2  ALBUMIN 3.5   Recent Labs  Lab 01/11/23 0230  LIPASE 30   No results for  input(s): "AMMONIA" in the last 168 hours. Coagulation Profile: No results for input(s): "INR", "PROTIME" in the last 168 hours. Cardiac Enzymes: No results for input(s): "CKTOTAL", "CKMB", "CKMBINDEX", "TROPONINI" in the last 168 hours. BNP (last 3 results) No results for input(s): "PROBNP" in the last 8760 hours. HbA1C: No results for input(s): "HGBA1C" in the last 72 hours. CBG: No results for input(s): "GLUCAP" in the last 168 hours. Lipid Profile: No results for input(s): "CHOL", "HDL", "LDLCALC", "TRIG", "CHOLHDL", "LDLDIRECT" in the last 72 hours. Thyroid Function Tests: No results for input(s): "TSH", "T4TOTAL", "FREET4", "T3FREE", "THYROIDAB" in the last 72 hours. Anemia Panel: No results for input(s): "VITAMINB12", "FOLATE", "FERRITIN", "TIBC", "IRON", "RETICCTPCT" in the last 72 hours. Sepsis Labs: No results for input(s): "PROCALCITON", "LATICACIDVEN" in the last 168 hours.  No results found for this or any previous visit (from the past 240 hour(s)).       Radiology Studies: CT ABDOMEN PELVIS W CONTRAST  Result Date: 01/11/2023 CLINICAL DATA:  52 year old female with history of suspected bowel obstruction. EXAM: CT ABDOMEN AND PELVIS WITH CONTRAST TECHNIQUE: Multidetector CT imaging of the abdomen and pelvis was performed using the standard protocol following bolus administration of intravenous contrast. RADIATION DOSE REDUCTION: This exam was performed according to the departmental dose-optimization program which includes automated exposure control, adjustment of the mA and/or kV according to patient size and/or use of iterative reconstruction technique. CONTRAST:  OMNIPAQUE IOHEXOL 300 MG/ML  SOLN COMPARISON:  CT of the abdomen and pelvis 05/02/2012. FINDINGS: Lower chest: Mild scarring in the lower lobes of the lungs bilaterally. Hepatobiliary: No suspicious cystic or solid hepatic lesions. No intra or extrahepatic biliary ductal dilatation. Gallbladder is  unremarkable in appearance. Pancreas: No pancreatic mass. No pancreatic ductal dilatation. No pancreatic or peripancreatic fluid collections or inflammatory changes. Spleen: Unremarkable. Adrenals/Urinary Tract: Multiple nonobstructive calculi are noted within the collecting systems of both kidneys (right greater than left), measuring 1-3 mm in size. Bilateral kidneys and adrenal glands are otherwise normal in appearance. No hydroureteronephrosis. Urinary bladder is nearly completely decompressed, but otherwise unremarkable in appearance. Stomach/Bowel: The appearance of the stomach is unremarkable. No pathologic dilatation of small bowel or colon. In the region of the sigmoid colon there is mass-like area of mural thickening with some mucosal hyperenhancement and subtle surrounding inflammatory changes best appreciated on axial image 57 of series 2. There is a diverticulum in this region, and it is possible that these changes could all be related to acute diverticulitis, however, the extent of mural thickening is greater than typically seen, and the overall appearance is concerning for potential neoplasm, estimated to measure approximately 6.3 x 3.8 x 3.7 cm (axial image 57 of series 2 and coronal image 58 of series 6). Status post appendectomy. Vascular/Lymphatic: Atherosclerotic calcifications in the abdominal aorta and pelvic vasculature. No lymphadenopathy noted in the  abdomen or pelvis. Reproductive: Uterus is heterogeneous in appearance with multiple heterogeneously enhancing lesions measuring up to 3.5 cm in diameter in the right-side of the fundal region, presumably multifocal fibroids. Ovaries are unremarkable in appearance. Other: Trace volume of free fluid in the low anatomic pelvis. No pneumoperitoneum. Musculoskeletal: There are no aggressive appearing lytic or blastic lesions noted in the visualized portions of the skeleton. IMPRESSION: 1. Mass-like mural thickening, mucosal hyperenhancement and  subtle inflammatory changes associated with the sigmoid colon. There is a possibility that this could simply represent an acute diverticulitis, however, an underlying sigmoid neoplasm is strongly suspected. Further evaluation with colonoscopy should be considered in the near future to evaluate for potential colorectal neoplasm if clinically appropriate. 2. Trace volume of ascites. 3. Nonobstructive nephrolithiasis in the collecting systems of both kidneys (right-greater-than-left) measuring 1-3 mm in size. 4. Aortic atherosclerosis. Electronically Signed   By: Trudie Reed M.D.   On: 01/11/2023 05:32        Scheduled Meds:  ascorbic acid  500 mg Oral Daily   nicotine  21 mg Transdermal Daily   pantoprazole  40 mg Oral Daily   polyethylene glycol  17 g Oral Daily   Continuous Infusions:  sodium chloride Stopped (01/12/23 0201)   piperacillin-tazobactam (ZOSYN)  IV 3.375 g (01/12/23 0922)     LOS: 1 day    Time spent: 35 minutes    Kaya Klausing A Shoshanna Mcquitty, MD [facility]   If 7PM-7AM, please contact night-coverage [email or web address]  01/12/2023, 2:00 PM

## 2023-01-12 NOTE — Telephone Encounter (Signed)
-----   Message from [facility] [place], [place] sent at 01/12/2023  9:06 AM EDT ----- Regarding: follow up Needs follow up with Jess or myself in 3-4 weeks for sigmoid diverticulitis. THanks-JLL

## 2023-01-12 NOTE — Progress Notes (Addendum)
Progress Note   Subjective  Hospital day #2 Chief Complaint: Sigmoid diverticulitis  Today, the patient tells me that her pain level has decreased.  She really wants to be on a full liquid diet so that she can have some grits and coffee in order to have a bowel movement.  Denies any new complaints or concerns.   Objective   Vital signs in last 24 hours: Temp:  [97.7 F (36.5 C)-98.3 F (36.8 C)] 98.1 F (36.7 C) (05/02 0509) Pulse Rate:  [75-81] 75 (05/02 0509) Resp:  [16-18] 18 (05/02 0509) BP: (102-147)/(79-96) 121/85 (05/02 0509) SpO2:  [96 %-99 %] 99 % (05/02 0509) Weight:  [16 kg] 63 kg (05/01 1000) Last BM Date : 01/11/23 General:    white female in NAD Heart:  Regular rate and rhythm; no murmurs Lungs: Respirations even and unlabored, lungs CTA bilaterally Abdomen:  Soft, mild- mod lower abdominal ttp and nondistended. Normal bowel sounds. Psych:  Cooperative. Normal mood and affect.  Intake/Output from previous day: 05/01 0701 - 05/02 0700 In: 3988.6 [P.O.:2280; I.V.:1291.6; IV Piggyback:417] Out: -   Lab Results: Recent Labs    01/11/23 0230 01/12/23 0452  WBC 18.0* 11.1*  HGB 13.7 12.8  HCT 41.5 40.2  PLT 401* 419*   BMET Recent Labs    01/11/23 0230 01/12/23 0452  NA 135 139  K 2.7* 4.3  CL 99 102  CO2 24 29  GLUCOSE 103* 86  BUN 8 <5*  CREATININE 0.66 0.62  CALCIUM 8.9 9.0   LFT Recent Labs    01/11/23 0230  PROT 7.2  ALBUMIN 3.5  AST 12*  ALT 12  ALKPHOS 59  BILITOT 0.5   Studies/Results: CT ABDOMEN PELVIS W CONTRAST  Result Date: 01/11/2023 CLINICAL DATA:  52 year old female with history of suspected bowel obstruction. EXAM: CT ABDOMEN AND PELVIS WITH CONTRAST TECHNIQUE: Multidetector CT imaging of the abdomen and pelvis was performed using the standard protocol following bolus administration of intravenous contrast. RADIATION DOSE REDUCTION: This exam was performed according to the departmental dose-optimization program which  includes automated exposure control, adjustment of the mA and/or kV according to patient size and/or use of iterative reconstruction technique. CONTRAST:  OMNIPAQUE IOHEXOL 300 MG/ML  SOLN COMPARISON:  CT of the abdomen and pelvis 05/02/2012. FINDINGS: Lower chest: Mild scarring in the lower lobes of the lungs bilaterally. Hepatobiliary: No suspicious cystic or solid hepatic lesions. No intra or extrahepatic biliary ductal dilatation. Gallbladder is unremarkable in appearance. Pancreas: No pancreatic mass. No pancreatic ductal dilatation. No pancreatic or peripancreatic fluid collections or inflammatory changes. Spleen: Unremarkable. Adrenals/Urinary Tract: Multiple nonobstructive calculi are noted within the collecting systems of both kidneys (right greater than left), measuring 1-3 mm in size. Bilateral kidneys and adrenal glands are otherwise normal in appearance. No hydroureteronephrosis. Urinary bladder is nearly completely decompressed, but otherwise unremarkable in appearance. Stomach/Bowel: The appearance of the stomach is unremarkable. No pathologic dilatation of small bowel or colon. In the region of the sigmoid colon there is mass-like area of mural thickening with some mucosal hyperenhancement and subtle surrounding inflammatory changes best appreciated on axial image 57 of series 2. There is a diverticulum in this region, and it is possible that these changes could all be related to acute diverticulitis, however, the extent of mural thickening is greater than typically seen, and the overall appearance is concerning for potential neoplasm, estimated to measure approximately 6.3 x 3.8 x 3.7 cm (axial image 57 of series 2 and coronal image  58 of series 6). Status post appendectomy. Vascular/Lymphatic: Atherosclerotic calcifications in the abdominal aorta and pelvic vasculature. No lymphadenopathy noted in the abdomen or pelvis. Reproductive: Uterus is heterogeneous in appearance with multiple  heterogeneously enhancing lesions measuring up to 3.5 cm in diameter in the right-side of the fundal region, presumably multifocal fibroids. Ovaries are unremarkable in appearance. Other: Trace volume of free fluid in the low anatomic pelvis. No pneumoperitoneum. Musculoskeletal: There are no aggressive appearing lytic or blastic lesions noted in the visualized portions of the skeleton. IMPRESSION: 1. Mass-like mural thickening, mucosal hyperenhancement and subtle inflammatory changes associated with the sigmoid colon. There is a possibility that this could simply represent an acute diverticulitis, however, an underlying sigmoid neoplasm is strongly suspected. Further evaluation with colonoscopy should be considered in the near future to evaluate for potential colorectal neoplasm if clinically appropriate. 2. Trace volume of ascites. 3. Nonobstructive nephrolithiasis in the collecting systems of both kidneys (right-greater-than-left) measuring 1-3 mm in size. 4. Aortic atherosclerosis. Electronically Signed   By: Trudie Reed M.D.   On: 01/11/2023 05:32     Assessment / Plan:   Assessment: 1.  Sigmoid diverticulitis with leukocytosis: Leukocytosis improving, pain improving, CT initially with suggestion of sigmoid mass versus diverticulitis but doubt this given that patient has no bowel obstruction and a negative colonoscopy February 2021 except for sigmoid diverticulosis with good prep 2.  GERD: Negative EGD 10/2018, status post dilation 54 French, no further dysphagia  Plan: 1.  Advance the patient to a full liquid diet this morning and then advance as tolerated 2.  Continue IV Zosyn while in the hospital and continue antibiotics for total of 10 days at time of discharge 3.  Continue Protonix 40 mg p.o. daily 4.  Patient will be scheduled to follow-up in the GI office in the next 2 to 3 weeks (if persistent abdominal pain then repeat CTAP (to rule out abscess) as outpatient, patient will need a colon  +/- EGD as outpatient in 6 to 8 weeks once diverticulitis has resolved  We will sign off with follow-up arranged as above.  Thank you for your kind consultation.   LOS: 1 day   Unk Lightning  01/12/2023, 8:56 AM     Attending physician's note   I have taken history, reviewed the chart and examined the patient. I performed a substantive portion of this encounter, including complete performance of at least one of the key components, in conjunction with the APP. I agree with the Advanced Practitioner's note, impression and recommendations.   See prev consult note for details Doing well Abdo pain has resolved.  Plan: -Can transition to oral A/Bs (augmentin) x 10 days -Advance diet -Continue Protonix -FU GI as outpt as above. Rpt CT if problems. Otherwise colon +/- EGD in 6-8 weeks as outpt -Follow [facility] as outpt -Will sign off for now.   Edman Circle, MD [facility] [phone]

## 2023-01-12 NOTE — Telephone Encounter (Signed)
No available appts within requested timeframe. Patient has been scheduled for a hospital follow up with Hyacinth Meeker, PA-C on 02/15/23 at 1:30 pm. Appt information mailed to patient.

## 2023-01-13 ENCOUNTER — Telehealth: Payer: Self-pay | Admitting: Cardiology

## 2023-01-13 DIAGNOSIS — K5732 Diverticulitis of large intestine without perforation or abscess without bleeding: Secondary | ICD-10-CM | POA: Diagnosis not present

## 2023-01-13 LAB — BASIC METABOLIC PANEL
Anion gap: 10 (ref 5–15)
BUN: 6 mg/dL (ref 6–20)
CO2: 27 mmol/L (ref 22–32)
Calcium: 9.3 mg/dL (ref 8.9–10.3)
Chloride: 100 mmol/L (ref 98–111)
Creatinine, Ser: 0.72 mg/dL (ref 0.44–1.00)
GFR, Estimated: >60 mL/min (ref >60)
Glucose, Bld: 87 mg/dL (ref 70–99)
Potassium: 3.7 mmol/L (ref 3.5–5.1)
Sodium: 137 mmol/L (ref 135–145)

## 2023-01-13 LAB — CBC
HCT: 46.3 % — ABNORMAL HIGH (ref 36.0–46.0)
Hemoglobin: 15 g/dL (ref 12.0–15.0)
MCH: 29 pg (ref 26.0–34.0)
MCHC: 32.4 g/dL (ref 30.0–36.0)
MCV: 89.6 fL (ref 80.0–100.0)
Platelets: 456 10*3/uL — ABNORMAL HIGH (ref 150–400)
RBC: 5.17 MIL/uL — ABNORMAL HIGH (ref 3.87–5.11)
RDW: 15.2 % (ref 11.5–15.5)
WBC: 9.5 10*3/uL (ref 4.0–10.5)
nRBC: 0 % (ref 0.0–0.2)

## 2023-01-13 LAB — CEA: CEA: 2.6 ng/mL (ref 0.0–4.7)

## 2023-01-13 MED ORDER — IRBESARTAN 75 MG PO TABS: 37.5000 mg | ORAL_TABLET | Freq: Every day | ORAL | Status: DC

## 2023-01-13 MED ORDER — METOPROLOL TARTRATE 12.5 MG HALF TABLET: 12.5000 mg | ORAL_TABLET | Freq: Two times a day (BID) | ORAL | Status: DC

## 2023-01-13 MED ORDER — POLYETHYLENE GLYCOL 3350 17 G PO PACK: 17.0000 g | PACK | Freq: Every day | ORAL | 0 refills | Status: DC

## 2023-01-13 MED ORDER — ONDANSETRON HCL 4 MG PO TABS: 4.0000 mg | ORAL_TABLET | Freq: Four times a day (QID) | ORAL | 0 refills | Status: DC | PRN

## 2023-01-13 MED ORDER — AMOXICILLIN-POT CLAVULANATE 875-125 MG PO TABS: 1.0000 | ORAL_TABLET | Freq: Two times a day (BID) | ORAL | 0 refills | Status: AC

## 2023-01-13 NOTE — Telephone Encounter (Signed)
Returned call to patient,   Patient states that her heart rate is elevated, she is admitted at [facility]. She states they are only giving her one medication. She is unhappy that her blood pressure and pulse are elevated. Advised patient that she is under the care of the hospital team and if they are need of cardiology advice they will consult [place].

## 2023-01-13 NOTE — Plan of Care (Signed)

## 2023-01-13 NOTE — Telephone Encounter (Signed)
STAT if HR is under 50 or over 120 (normal HR is 60-100 beats per minute)  What is your heart rate? 99-104  Do you have a log of your heart rate readings (document readings)? No   Do you have any other symptoms? Patient said that BP 141/101; Patient is currently hospitalized at [facility] on the third floor.

## 2023-01-13 NOTE — Discharge Summary (Signed)
Physician Discharge Summary   Patient: Cheryl Carlson MRN: [id number] DOB: 1971/06/16  Admit date:     01/11/2023  Discharge date: 01/13/23  Discharge Physician: Alba Cory   PCP: Tiffany Kocher, DO   Recommendations at discharge:    Needs follow up resolution of diverticulitis. Needs follow up with GI in 6 week, might need repeat CT abdomen vs Colonoscopy    Discharge Diagnoses: Principal Problem:   Sigmoid diverticulitis Active Problems:   Hypokalemia   Essential hypertension, benign   GERD (gastroesophageal reflux disease)   Tobacco abuse   Left lower quadrant abdominal pain  Resolved Problems:   * No resolved hospital problems. *  Hospital Course: 52 year old with past medical history significant for hypertension, sigmoid diverticulosis, nicotine dependence, GERD presented to the ER complaining of abdominal pain for 3 days.  Reported abdominal pain lower quadrant 8 out of 10 associated with nausea and vomiting and changes in her bowel habits.  Small caliber stool.  Evaluation in the ED she was found to have masslike moral thickening, mucosal hyperenhancement and subtle inflammatory changes associated with the sigmoid colon.  Could represent acute diverticulitis or underlying sigmoid neoplasm and.   She was admitted for further evaluation, started on IV fluids and IV antibiotics.  Admitted for acute diverticulitis.    Assessment and Plan: 1-Acute Sigmoid Diverticulitis;  Patient presented with abdominal pain, leukocytosis, CT scan with finding consistent with diverticulitis versus underlying mass. -GI has been consulted and recommend treatment for diverticulitis, close follow-up in the office for repeat CT scan and or colonoscopy in 3 weeks -Abdominal pain improved, white blood cell count trending down -she was treated with IV antibiotics, IV fluids. Diet was advanced to soft diet and has been tolerating diet.     2-Hypokalemia;  Replaced.    Tobacco abuse;  counseling.    GERD; PPI   HTN; Resume Azor.    Sinus tachycardia, asymptomatic. Likely related to acute illness. HR 100--98.  Stable.          Consultants: GI Procedures performed: None Disposition: Home Diet recommendation:  Discharge Diet Orders (From admission, onward)     Start     Ordered   01/13/23 0000  Diet - low sodium heart healthy        01/13/23 0942           Cardiac diet DISCHARGE MEDICATION: Allergies as of 01/13/2023   No Known Allergies      Medication List     STOP taking these medications    baclofen 10 MG tablet Commonly known as: LIORESAL   diclofenac Sodium 1 % Gel Commonly known as: Voltaren   ELDERBERRY PO   naproxen 500 MG tablet Commonly known as: NAPROSYN   NON FORMULARY   NON FORMULARY       TAKE these medications    acetaminophen 500 MG tablet Commonly known as: TYLENOL Take 500 mg by mouth every 6 (six) hours as needed for moderate pain.   amlodipine-olmesartan 10-20 MG tablet Commonly known as: AZOR Take 1 tablet by mouth daily.   amoxicillin-clavulanate 875-125 MG tablet Commonly known as: AUGMENTIN Take 1 tablet by mouth 2 (two) times daily for 10 days.   aspirin EC 81 MG tablet Take 81 mg by mouth daily.   cyclobenzaprine 10 MG tablet Commonly known as: FLEXERIL Take 10 mg by mouth 3 (three) times daily as needed for muscle spasms.   Hair/Skin/Nails Tabs Take 1 tablet by mouth daily.   omeprazole 40 MG capsule  Commonly known as: PRILOSEC TAKE 1 CAPSULE(40 MG) BY MOUTH EVERY MORNING 30 MINUTES BEFORE BREAKFAST   ondansetron 4 MG tablet Commonly known as: ZOFRAN Take 1 tablet (4 mg total) by mouth every 6 (six) hours as needed for nausea.   polyethylene glycol 17 g packet Commonly known as: MIRALAX / GLYCOLAX Take 17 g by mouth daily.   rosuvastatin 20 MG tablet Commonly known as: Crestor Take 1 tablet (20 mg total) by mouth daily.   VITAMIN C PO Take by mouth daily.         Follow-up Information     Tiffany Kocher, DO Follow up in 1 week(s).   Specialty: Family Medicine Contact information: [address] [place] [address] [phone]         Lynann Bologna, MD Follow up in 6 week(s).   Specialties: Gastroenterology, Internal Medicine Contact information: [address] [place] [address] [phone]                Discharge Exam: Ceasar Mons Weights   01/11/23 1000  Weight: 63 kg   General; NAD  Condition at discharge: stable  The results of significant diagnostics from this hospitalization (including imaging, microbiology, ancillary and laboratory) are listed below for reference.   Imaging Studies: CT ABDOMEN PELVIS W CONTRAST  Result Date: 01/11/2023 CLINICAL DATA:  52 year old female with history of suspected bowel obstruction. EXAM: CT ABDOMEN AND PELVIS WITH CONTRAST TECHNIQUE: Multidetector CT imaging of the abdomen and pelvis was performed using the standard protocol following bolus administration of intravenous contrast. RADIATION DOSE REDUCTION: This exam was performed according to the departmental dose-optimization program which includes automated exposure control, adjustment of the mA and/or kV according to patient size and/or use of iterative reconstruction technique. CONTRAST:  OMNIPAQUE IOHEXOL 300 MG/ML  SOLN COMPARISON:  CT of the abdomen and pelvis 05/02/2012. FINDINGS: Lower chest: Mild scarring in the lower lobes of the lungs bilaterally. Hepatobiliary: No suspicious cystic or solid hepatic lesions. No intra or extrahepatic biliary ductal dilatation. Gallbladder is unremarkable in appearance. Pancreas: No pancreatic mass. No pancreatic ductal dilatation. No pancreatic or peripancreatic fluid collections or inflammatory changes. Spleen: Unremarkable. Adrenals/Urinary Tract: Multiple nonobstructive calculi are noted within the collecting systems of both kidneys (right greater than left), measuring 1-3 mm in size.  Bilateral kidneys and adrenal glands are otherwise normal in appearance. No hydroureteronephrosis. Urinary bladder is nearly completely decompressed, but otherwise unremarkable in appearance. Stomach/Bowel: The appearance of the stomach is unremarkable. No pathologic dilatation of small bowel or colon. In the region of the sigmoid colon there is mass-like area of mural thickening with some mucosal hyperenhancement and subtle surrounding inflammatory changes best appreciated on axial image 57 of series 2. There is a diverticulum in this region, and it is possible that these changes could all be related to acute diverticulitis, however, the extent of mural thickening is greater than typically seen, and the overall appearance is concerning for potential neoplasm, estimated to measure approximately 6.3 x 3.8 x 3.7 cm (axial image 57 of series 2 and coronal image 58 of series 6). Status post appendectomy. Vascular/Lymphatic: Atherosclerotic calcifications in the abdominal aorta and pelvic vasculature. No lymphadenopathy noted in the abdomen or pelvis. Reproductive: Uterus is heterogeneous in appearance with multiple heterogeneously enhancing lesions measuring up to 3.5 cm in diameter in the right-side of the fundal region, presumably multifocal fibroids. Ovaries are unremarkable in appearance. Other: Trace volume of free fluid in the low anatomic pelvis. No pneumoperitoneum. Musculoskeletal: There are no  aggressive appearing lytic or blastic lesions noted in the visualized portions of the skeleton. IMPRESSION: 1. Mass-like mural thickening, mucosal hyperenhancement and subtle inflammatory changes associated with the sigmoid colon. There is a possibility that this could simply represent an acute diverticulitis, however, an underlying sigmoid neoplasm is strongly suspected. Further evaluation with colonoscopy should be considered in the near future to evaluate for potential colorectal neoplasm if clinically appropriate. 2.  Trace volume of ascites. 3. Nonobstructive nephrolithiasis in the collecting systems of both kidneys (right-greater-than-left) measuring 1-3 mm in size. 4. Aortic atherosclerosis. Electronically Signed   By: Trudie Reed M.D.   On: 01/11/2023 05:32    Microbiology: Results for orders placed or performed in visit on 10/24/19  SARS Coronavirus 2 (TAT 6-24 hrs)     Status: None   Collection Time: 10/24/19 12:00 AM  Result Value Ref Range Status   SARS Coronavirus 2 RESULT:  NEGATIVE  Final    Comment: RESULT:  NEGATIVESARS-CoV-2 INTERPRETATION:A NEGATIVE  test result means that SARS-CoV-2 RNA was not present in the specimen above the limit of detection of this test. This does not preclude a possible SARS-CoV-2 infection and should not be used as the  sole basis for patient management decisions. Negative results must be combined with clinical observations, patient history, and epidemiological information. Optimum specimen types and timing for peak viral levels during infections caused by SARS-CoV-2  have not been determined. Collection of multiple specimens or types of specimens may be necessary to detect virus. Improper specimen collection and handling, sequence variability under primers/probes, or organism present below the limit of detection may  lead to false negative results. Positive and negative predictive values of testing are highly dependent on prevalence. False negative test results are more likely when prevalence of disease is high.The expected result is NEGATIVE.Fact  Sheet for  Healthcare Providers: quierodirigir.com.Fact Sheet for Patients: HairSlick.no.Normal Reference Range - Negative     Labs: CBC: Recent Labs  Lab 01/11/23 0230 01/12/23 0452 01/13/23 0509  WBC 18.0* 11.1* 9.5  NEUTROABS 13.2*  --   --   HGB 13.7 12.8 15.0  HCT 41.5 40.2 46.3*  MCV 88.5 90.1 89.6  PLT 401* 419* 456*   Basic Metabolic Panel: Recent  Labs  Lab 01/11/23 0230 01/11/23 0905 01/12/23 0452 01/13/23 0509  NA 135  --  139 137  K 2.7*  --  4.3 3.7  CL 99  --  102 100  CO2 24  --  29 27  GLUCOSE 103*  --  86 87  BUN 8  --  <5* 6  CREATININE 0.66  --  0.62 0.72  CALCIUM 8.9  --  9.0 9.3  MG  --  1.9  --   --    Liver Function Tests: Recent Labs  Lab 01/11/23 0230  AST 12*  ALT 12  ALKPHOS 59  BILITOT 0.5  PROT 7.2  ALBUMIN 3.5   CBG: No results for input(s): "GLUCAP" in the last 168 hours.  Discharge time spent: greater than 30 minutes.  Signed: Alba Cory, MD [facility] 01/13/2023

## 2023-01-16 ENCOUNTER — Telehealth: Payer: Self-pay

## 2023-01-16 NOTE — Transitions of Care (Post Inpatient/ED Visit) (Signed)
   01/16/2023  Name: Cheryl Carlson MRN: [id number] DOB: 1971-03-12  Today's TOC FU Call Status: Today's TOC FU Call Status:: Successful TOC FU Call Competed TOC FU Call Complete Date: 01/16/23  Transition Care Management Follow-up Telephone Call Date of Discharge: 01/13/23 Discharge Facility: [facility]) Type of Discharge: Inpatient Admission Primary Inpatient Discharge Diagnosis:: abd pain How have you been since you were released from the hospital?: Same Any questions or concerns?: No  Items Reviewed: Did you receive and understand the discharge instructions provided?: Yes Medications obtained,verified, and reconciled?: Yes (Medications Reviewed) Any new allergies since your discharge?: No Dietary orders reviewed?: Yes Do you have support at home?: No  Medications Reviewed Today: Medications Reviewed Today     Reviewed by Karena Addison, LPN (Licensed Practical Nurse) on 01/16/23 at 1155  Med List Status: <None>   Medication Order Taking? Sig Documenting Provider Last Dose Status Informant  acetaminophen (TYLENOL) 500 MG tablet [id number] Yes Take 500 mg by mouth every 6 (six) hours as needed for moderate pain. [provider] Taking Active Self  amlodipine-olmesartan (AZOR) 10-20 MG tablet [id number] Yes Take 1 tablet by mouth daily. Tiffany Kocher, DO Taking Active Self  amoxicillin-clavulanate (AUGMENTIN) 875-125 MG tablet [id number] Yes Take 1 tablet by mouth 2 (two) times daily for 10 days. Alba Cory, MD Taking Active   Ascorbic Acid (VITAMIN C PO) [id number] Yes Take by mouth daily. [provider] Taking Active Self  aspirin EC 81 MG tablet [id number] Yes Take 81 mg by mouth daily. [provider] Taking Active Self  cyclobenzaprine (FLEXERIL) 10 MG tablet [id number] Yes Take 10 mg by mouth 3 (three) times daily as needed for muscle spasms. [provider] Taking Active Self  Multiple Vitamins-Minerals (HAIR/SKIN/NAILS) TABS  [id number] Yes Take 1 tablet by mouth daily.  [provider] Taking Active Self  omeprazole (PRILOSEC) 40 MG capsule [id number] Yes TAKE 1 CAPSULE(40 MG) BY MOUTH EVERY MORNING 30 MINUTES BEFORE BREAKFAST Iva Boop, MD Taking Active Self  ondansetron (ZOFRAN) 4 MG tablet [id number] Yes Take 1 tablet (4 mg total) by mouth every 6 (six) hours as needed for nausea. Regalado, Belkys A, MD Taking Active   polyethylene glycol (MIRALAX / GLYCOLAX) 17 g packet [id number] Yes Take 17 g by mouth daily. Regalado, Belkys A, MD Taking Active   rosuvastatin (CRESTOR) 20 MG tablet [id number] Yes Take 1 tablet (20 mg total) by mouth daily. Tiffany Kocher, DO Taking Active Self            Home Care and Equipment/Supplies: Were Home Health Services Ordered?: NA Any new equipment or medical supplies ordered?: NA  Functional Questionnaire: Do you need assistance with bathing/showering or dressing?: No Do you need assistance with meal preparation?: No Do you need assistance with eating?: No Do you have difficulty maintaining continence: No Do you need assistance with getting out of bed/getting out of a chair/moving?: No Do you have difficulty managing or taking your medications?: No  Follow up appointments reviewed: PCP Follow-up appointment confirmed?: Yes Date of PCP follow-up appointment?: 01/26/23 Follow-up Provider: [facility] Follow-up appointment confirmed?: Yes Date of Specialist follow-up appointment?: 02/15/23 Follow-Up Specialty Provider:: Zerita Boers Do you need transportation to your follow-up appointment?: No Do you understand care options if your condition(s) worsen?: Yes-patient verbalized understanding    SIGNATURE Karena Addison, LPN [facility] Nurse Health Advisor Direct Dial [phone]

## 2023-01-26 ENCOUNTER — Ambulatory Visit: Payer: BC Managed Care – PPO | Admitting: Student

## 2023-01-26 ENCOUNTER — Encounter: Payer: Self-pay | Admitting: Student

## 2023-01-26 VITALS — BP 108/77 | HR 89 | Ht 62.0 in | Wt 138.1 lb

## 2023-01-26 DIAGNOSIS — K219 Gastro-esophageal reflux disease without esophagitis: Secondary | ICD-10-CM

## 2023-01-26 DIAGNOSIS — K5732 Diverticulitis of large intestine without perforation or abscess without bleeding: Secondary | ICD-10-CM

## 2023-01-26 DIAGNOSIS — I10 Essential (primary) hypertension: Secondary | ICD-10-CM

## 2023-01-26 MED ORDER — AMLODIPINE-OLMESARTAN 10-20 MG PO TABS: 1.0000 | ORAL_TABLET | Freq: Every day | ORAL | 4 refills | Status: DC

## 2023-01-26 MED ORDER — OMEPRAZOLE 40 MG PO CPDR: DELAYED_RELEASE_CAPSULE | ORAL | 1 refills | Status: DC

## 2023-01-26 NOTE — Assessment & Plan Note (Signed)
Symptoms resolved.  Follow-up with GI next month, consideration of repeat CT versus colonoscopy.

## 2023-01-26 NOTE — Assessment & Plan Note (Signed)
Within goal.  Compliant with Azor, without side effects. - Continue Azor

## 2023-01-26 NOTE — Patient Instructions (Signed)
It was great to see you! Thank you for allowing me to participate in your care!   I recommend that you always bring your medications to each appointment as this makes it easy to ensure we are on the correct medications and helps [place] not miss when refills are needed.  Our plans for today:  - I have refilled your home medications - Please go to your GI appointment next month - f/u in 4-5 months for routine visit and flu shot  Take care and seek immediate care sooner if you develop any concerns. Please remember to show up 15 minutes before your scheduled appointment time!  Tiffany Kocher, DO [facility]

## 2023-01-26 NOTE — Assessment & Plan Note (Addendum)
Well-controlled on Prilosec.  Remain vigilant for vitamin deficiencies with long-term PPI use. - Continue Prilosec

## 2023-01-26 NOTE — Progress Notes (Signed)
    SUBJECTIVE:   CHIEF COMPLAINT / HPI:   Hospital Follow-up: Diverticulitis Completed IV abx during admission. Completed 10 day course of augementin.  Initial symptoms of constipation, vomiting, right lower quadrant pain-now resolved. Follow-up with GI on 02/15/2023, plan for repeat CT v. Colonoscopy.  HTN Within goal. Requesting refill of Azor.  Most recent BMP this month WNL. Compliant with medication and without side effects.  GERD Longstanding GERD, EGD negative in 2020.  Well-controlled on Prilosec 40 mg daily.  Requesting refill of Prilosec.  Compliant and without side effects.   OBJECTIVE:   BP 108/77   Pulse 89   Ht 5\' 2"  (1.575 m)   Wt 138 lb 2 oz (62.7 kg)   LMP 04/22/2016   SpO2 100%   BMI 25.26 kg/m    General: NAD, pleasant Cardio: RRR, no MRG. Cap Refill <2s. Respiratory: CTAB, normal wob on RA GI: Abdomen is soft, not tender, not distended. BS present Skin: Warm and dry  ASSESSMENT/PLAN:   Sigmoid diverticulitis Symptoms resolved.  Follow-up with GI next month, consideration of repeat CT versus colonoscopy.  Essential hypertension, benign Within goal.  Compliant with Azor, without side effects. - Continue Azor  GERD (gastroesophageal reflux disease) Well-controlled on Prilosec.  Remain vigilant for vitamin deficiencies with long-term PPI use. - Continue Prilosec   Follow-up recommendations Flu shot during flu season Discussed smoking cessation Reassess care gaps  Tiffany Kocher, DO [facility]

## 2023-02-15 ENCOUNTER — Ambulatory Visit: Payer: BC Managed Care – PPO | Admitting: Physician Assistant

## 2023-06-23 ENCOUNTER — Encounter: Payer: Self-pay | Admitting: Student

## 2023-06-23 ENCOUNTER — Ambulatory Visit: Payer: BC Managed Care – PPO | Admitting: Student

## 2023-06-23 ENCOUNTER — Other Ambulatory Visit (HOSPITAL_COMMUNITY)
Admission: RE | Admit: 2023-06-23 | Discharge: 2023-06-23 | Disposition: A | Discharge status: Home / Self Care | Payer: Self-pay | Source: Ambulatory Visit | Attending: Family Medicine | Admitting: Family Medicine

## 2023-06-23 VITALS — BP 121/87 | HR 86 | Ht 62.0 in | Wt 136.4 lb

## 2023-06-23 DIAGNOSIS — M25512 Pain in left shoulder: Secondary | ICD-10-CM | POA: Diagnosis not present

## 2023-06-23 DIAGNOSIS — Z0000 Encounter for general adult medical examination without abnormal findings: Secondary | ICD-10-CM

## 2023-06-23 DIAGNOSIS — R7303 Prediabetes: Secondary | ICD-10-CM

## 2023-06-23 DIAGNOSIS — E782 Mixed hyperlipidemia: Secondary | ICD-10-CM

## 2023-06-23 DIAGNOSIS — Z113 Encounter for screening for infections with a predominantly sexual mode of transmission: Secondary | ICD-10-CM | POA: Diagnosis present

## 2023-06-23 DIAGNOSIS — G8929 Other chronic pain: Secondary | ICD-10-CM

## 2023-06-23 DIAGNOSIS — I10 Essential (primary) hypertension: Secondary | ICD-10-CM

## 2023-06-23 LAB — POCT GLYCOSYLATED HEMOGLOBIN (HGB A1C): Hemoglobin A1C: 5.5 % (ref 4.0–5.6)

## 2023-06-23 MED ORDER — ROSUVASTATIN CALCIUM 20 MG PO TABS: 20.0000 mg | ORAL_TABLET | Freq: Every day | ORAL | 3 refills | Status: DC

## 2023-06-23 MED ORDER — TIZANIDINE HCL 2 MG PO TABS
2.0000 mg | ORAL_TABLET | Freq: Four times a day (QID) | ORAL | 0 refills | Status: DC | PRN
Start: 2023-06-23 — End: 2024-02-08

## 2023-06-23 NOTE — Assessment & Plan Note (Signed)
BP in goal. Compliant on Azor and without side-effects. -Continue Azor -BMP
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
## 2023-06-23 NOTE — Patient Instructions (Signed)
It was great to see you! Thank you for allowing me to participate in your care!   I recommend that you always bring your medications to each appointment as this makes it easy to ensure we are on the correct medications and helps [place] not miss when refills are needed.  Our plans for today:  - Continue taking your current medications - Follow-up in 1 year or sooner if needed  We are checking some labs today, I will call you if they are abnormal will send you a MyChart message or a letter if they are normal.  If you do not hear about your labs in the next 2 weeks please let us know.  Take care and seek immediate care sooner if you develop any concerns. Please remember to show up 15 minutes before your scheduled appointment time!  Tiffany Kocher, DO [facility]

## 2023-06-23 NOTE — Assessment & Plan Note (Signed)
A1c today 5.5. Continue life-style modifications

## 2023-06-24 LAB — BASIC METABOLIC PANEL
BUN/Creatinine Ratio: 14 (ref 9–23)
BUN: 11 mg/dL (ref 6–24)
CO2: 22 mmol/L (ref 20–29)
Calcium: 9.7 mg/dL (ref 8.7–10.2)
Chloride: 103 mmol/L (ref 96–106)
Creatinine, Ser: 0.78 mg/dL (ref 0.57–1.00)
Glucose: 74 mg/dL (ref 70–99)
Potassium: 3.9 mmol/L (ref 3.5–5.2)
Sodium: 142 mmol/L (ref 134–144)
eGFR: 90 mL/min/{1.73_m2} (ref >59)

## 2023-06-24 LAB — HIV ANTIBODY (ROUTINE TESTING W REFLEX): HIV Screen 4th Generation wRfx: NONREACTIVE

## 2023-06-24 LAB — RPR: RPR Ser Ql: NONREACTIVE

## 2023-06-26 LAB — CERVICOVAGINAL ANCILLARY ONLY
Chlamydia: NEGATIVE
Comment: NEGATIVE
Comment: NEGATIVE
Comment: NORMAL
Neisseria Gonorrhea: NEGATIVE
Trichomonas: NEGATIVE

## 2023-07-20 ENCOUNTER — Other Ambulatory Visit: Payer: Self-pay | Admitting: Student

## 2023-07-20 DIAGNOSIS — K219 Gastro-esophageal reflux disease without esophagitis: Secondary | ICD-10-CM

## 2023-10-03 ENCOUNTER — Other Ambulatory Visit: Payer: Self-pay | Admitting: Student

## 2023-10-03 DIAGNOSIS — I10 Essential (primary) hypertension: Secondary | ICD-10-CM

## 2023-11-23 ENCOUNTER — Encounter (HOSPITAL_BASED_OUTPATIENT_CLINIC_OR_DEPARTMENT_OTHER): Payer: Self-pay | Admitting: Orthopedic Surgery

## 2023-11-23 ENCOUNTER — Telehealth: Payer: Self-pay | Admitting: *Deleted

## 2023-11-23 NOTE — Telephone Encounter (Signed)
 Pt has been scheduled to see Rise Paganini, NP, 11/30/23, clearance will be addressed at that time.  Will route back to [facility] to make them aware
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
## 2023-11-23 NOTE — Telephone Encounter (Signed)
 mo

## 2023-11-28 ENCOUNTER — Encounter (HOSPITAL_BASED_OUTPATIENT_CLINIC_OR_DEPARTMENT_OTHER)
Admission: RE | Admit: 2023-11-28 | Discharge: 2023-11-28 | Disposition: A | Discharge status: Home / Self Care | Source: Ambulatory Visit | Attending: Orthopedic Surgery | Admitting: Orthopedic Surgery

## 2023-11-28 DIAGNOSIS — Z01810 Encounter for preprocedural cardiovascular examination: Secondary | ICD-10-CM | POA: Diagnosis present

## 2023-11-28 NOTE — Progress Notes (Signed)

## 2023-11-30 ENCOUNTER — Encounter: Payer: Self-pay | Admitting: Emergency Medicine

## 2023-11-30 ENCOUNTER — Ambulatory Visit: Attending: Emergency Medicine | Admitting: Emergency Medicine

## 2023-11-30 VITALS — BP 106/76 | HR 91 | Ht 64.0 in | Wt 133.0 lb

## 2023-11-30 DIAGNOSIS — I10 Essential (primary) hypertension: Secondary | ICD-10-CM

## 2023-11-30 DIAGNOSIS — Z01810 Encounter for preprocedural cardiovascular examination: Secondary | ICD-10-CM

## 2023-11-30 DIAGNOSIS — Z720 Tobacco use: Secondary | ICD-10-CM

## 2023-11-30 MED ORDER — AMLODIPINE-OLMESARTAN 10-20 MG PO TABS: 1.0000 | ORAL_TABLET | Freq: Every day | ORAL | 3 refills | Status: DC

## 2023-11-30 NOTE — Patient Instructions (Signed)
 Medication Instructions:  NO CHANGES    Lab Work: NONE   Testing/Procedures: NONE               Follow-Up: At [facility], you and your health needs are our priority.  As part of our continuing mission to provide you with exceptional heart care, we have created designated Provider Care Teams.  These Care Teams include your primary Cardiologist (physician) and Advanced Practice Providers (APPs -  Physician Assistants and Nurse Practitioners) who all work together to provide you with the care you need, when you need it.   Your next appointment:   12 month(s)  Provider:   Jodelle Red, MD   Other Instructions:

## 2023-11-30 NOTE — Progress Notes (Signed)
 4 Cardiology Office Note:    Date:  11/30/2023  ID:  Cheryl Carlson, DOB [birth year], MRN [id number] PCP: Tiffany Kocher, DO  [facility] Providers Cardiologist:  Jodelle Red, MD       Patient Profile:      Chief Complaint: Preoperative cardiovascular exam  History of Present Illness:  Cheryl Carlson is a 53 y.o. female with visit-pertinent history of hypertension, MVP, GERD, tobacco abuse  Echocardiogram 04/2018 showed LVEF 60-65%, no RWMA, diastolic parameters normal, no valvular abnormalities.  Heart monitor 04/2018 was normal without significant abnormal rhythms.  Exercise tolerance test 04/2018 was normal with no ischemic ST changes.  She was last seen in clinic on 04/25/2022 by Dr. Cristal Deer.  She was doing well at the time without cardiovascular concerns or complaints.  She was pending left total shoulder arthroplasty and cleared from a cardiology standpoint.  She was to follow-up in 2 years.   Discussed the use of AI scribe software for clinical note transcription with the patient, who gave verbal consent to proceed.  The patient arrives to clinic today by herself.  The patient presents today for preoperative clearance for a right knee scope with meniscus repair.  Today she is without any acute cardiovascular complaints or concerns.  She tells me she is doing "great"  She reports being active, taking care of her mother with dementia and her four-month-old grandchild. Despite her shoulder and knee issues, she is able to do work around the house and continue to be physically active.  She denies any cardiovascular symptoms while being active. She is a current smoker, consuming a pack of cigarettes every two to three days.   She denies any chest pain, dyspnea, lightheadedness, dizziness, syncope, presyncope, claudication, hematochezia, melena, orthopnea, PND,    Review of systems:  Please see the history of present illness. All other systems are reviewed and  otherwise negative.     Home Medications:    Current Meds  Medication Sig   Ascorbic Acid (VITAMIN C PO) Take by mouth daily.   aspirin EC 81 MG tablet Take 81 mg by mouth daily.   Multiple Vitamins-Minerals (HAIR/SKIN/NAILS) TABS Take 1 tablet by mouth daily.    omeprazole (PRILOSEC) 40 MG capsule TAKE 1 CAPSULE(40 MG) BY MOUTH EVERY MORNING 30 MINUTES BEFORE BREAKFAST   rosuvastatin (CRESTOR) 20 MG tablet Take 1 tablet (20 mg total) by mouth daily.   tiZANidine (ZANAFLEX) 2 MG tablet Take 1 tablet (2 mg total) by mouth every 6 (six) hours as needed for muscle spasms.   [DISCONTINUED] amlodipine-olmesartan (AZOR) 10-20 MG tablet TAKE 1 TABLET BY MOUTH DAILY   Studies Reviewed:       Exercise stress test 05/10/2018 Blood pressure demonstrated a normal response to exercise. There was no ST segment deviation noted during stress.   1. Good exercise tolerance.  2. No ischemic ST segment changes.   Echocardiogram 05/03/2018 - Left ventricle: The cavity size was normal. Systolic function was    normal. The estimated ejection fraction was in the range of 60%    to 65%. Wall motion was normal; there were no regional wall    motion abnormalities. Left ventricular diastolic function    parameters were normal.  - Aortic valve: Transvalvular velocity was within the normal range.    There was no stenosis. There was no regurgitation.  - Mitral valve: Transvalvular velocity was within the normal range.    There was no evidence for stenosis. There was no regurgitation.  - Right  ventricle: The cavity size was normal. Wall thickness was    normal. Systolic function was normal.  - Atrial septum: No defect or patent foramen ovale was identified    by color flow Doppler.  - Tricuspid valve: There was trivial regurgitation.  - Pulmonary arteries: Systolic pressure was within the normal    range. PA peak pressure: 23 mm Hg (S).   ZIO 05/03/2018 Monitor worn for 3 days 15 hours. No afib or  significant arrhythmia detected. Less than 1% ectopic beats. Baseline is sinus rhythm at 66 bpm. 12 patient triggered events, all were sinus rhythm or sinus tachycardia. Of note, 20% of her recording was sinus tachycardia, remaining 80% was normal sinus rhythm.  Risk Assessment/Calculations:             Physical Exam:   VS:  BP 106/76   Pulse 91   Ht 5\' 4"  (1.626 m)   Wt 133 lb (60.3 kg)   LMP 04/22/2016   SpO2 97%   BMI 22.83 kg/m    Wt Readings from Last 3 Encounters:  11/30/23 133 lb (60.3 kg)  06/23/23 136 lb 6.4 oz (61.9 kg)  01/26/23 138 lb 2 oz (62.7 kg)    GEN: Well nourished, well developed in no acute distress NECK: No JVD; No carotid bruits CARDIAC: RRR, no murmurs, rubs, gallops RESPIRATORY:  Clear to auscultation without rales, wheezing or rhonchi  ABDOMEN: Soft, non-tender, non-distended EXTREMITIES:  No edema; No acute deformity     Assessment and Plan:  Preoperative cardiovascular exam According to the Revised Cardiac Risk Index (RCRI), her Perioperative Risk of Major Cardiac Event is (%): 0.4. Her Functional Capacity in METs is: 6.79 according to the [place] Activity Status Index (DASI). Therefore, based on ACC/AHA guidelines, patient would be at acceptable risk for the planned procedure without further cardiovascular testing. I will route this recommendation to the requesting party via Epic fax function.  Hypertension Blood pressure today is 106/76 and under excellent control EKG 11/28/2023 showed normal sinus rhythm but now acute ST or T wave changes -Continue monitoring blood pressure at home -Continue amlodipine-olmesartan 10-20 mg daily  Tobacco abuse Currently smoking 1 pack every 2 to 3 days and desires to quit -Currently working toward cessation -Encouraged complete cessation            Dispo:  Return in about 1 year (around 11/29/2024).  Signed, Cheryl Robert, NP

## 2023-12-01 ENCOUNTER — Encounter (HOSPITAL_BASED_OUTPATIENT_CLINIC_OR_DEPARTMENT_OTHER)
Admission: RE | Disposition: A | Discharge status: Home / Self Care | Payer: Self-pay | Source: Home / Self Care | Attending: Orthopedic Surgery

## 2023-12-01 ENCOUNTER — Ambulatory Visit (HOSPITAL_BASED_OUTPATIENT_CLINIC_OR_DEPARTMENT_OTHER): Payer: Self-pay | Admitting: Certified Registered"

## 2023-12-01 ENCOUNTER — Ambulatory Visit (HOSPITAL_BASED_OUTPATIENT_CLINIC_OR_DEPARTMENT_OTHER)
Admission: RE | Admit: 2023-12-01 | Discharge: 2023-12-01 | Disposition: A | Discharge status: Home / Self Care | Payer: Medicaid Other | Attending: Orthopedic Surgery | Admitting: Orthopedic Surgery

## 2023-12-01 ENCOUNTER — Other Ambulatory Visit: Payer: Self-pay

## 2023-12-01 ENCOUNTER — Encounter (HOSPITAL_BASED_OUTPATIENT_CLINIC_OR_DEPARTMENT_OTHER): Payer: Self-pay | Admitting: Orthopedic Surgery

## 2023-12-01 DIAGNOSIS — M94261 Chondromalacia, right knee: Secondary | ICD-10-CM | POA: Insufficient documentation

## 2023-12-01 DIAGNOSIS — M6751 Plica syndrome, right knee: Secondary | ICD-10-CM | POA: Insufficient documentation

## 2023-12-01 DIAGNOSIS — Z01818 Encounter for other preprocedural examination: Secondary | ICD-10-CM

## 2023-12-01 DIAGNOSIS — I10 Essential (primary) hypertension: Secondary | ICD-10-CM | POA: Diagnosis not present

## 2023-12-01 DIAGNOSIS — Z79899 Other long term (current) drug therapy: Secondary | ICD-10-CM | POA: Insufficient documentation

## 2023-12-01 DIAGNOSIS — S83281A Other tear of lateral meniscus, current injury, right knee, initial encounter: Secondary | ICD-10-CM | POA: Diagnosis present

## 2023-12-01 DIAGNOSIS — K219 Gastro-esophageal reflux disease without esophagitis: Secondary | ICD-10-CM | POA: Diagnosis not present

## 2023-12-01 HISTORY — PX: KNEE ARTHROSCOPY WITH LATERAL MENISECTOMY: SHX6193

## 2023-12-01 SURGERY — ARTHROSCOPY, KNEE, WITH LATERAL MENISCECTOMY
Anesthesia: General | Site: Knee | Laterality: Right

## 2023-12-01 MED ORDER — ACETAMINOPHEN 10 MG/ML IV SOLN: 1000.0000 mg | Freq: Once | INTRAVENOUS | Status: DC | PRN

## 2023-12-01 MED ORDER — FENTANYL CITRATE (PF) 100 MCG/2ML IJ SOLN
25.0000 ug | INTRAMUSCULAR | Status: DC | PRN
  Administered 2023-12-01 (×2): 50 ug via INTRAVENOUS

## 2023-12-01 MED ORDER — MIDAZOLAM HCL 2 MG/2ML IJ SOLN
INTRAMUSCULAR | Status: AC
Start: 2023-12-01 — End: ?
  Filled 2023-12-01: qty 2

## 2023-12-01 MED ORDER — PROPOFOL 10 MG/ML IV BOLUS
INTRAVENOUS | Status: DC | PRN
Start: 2023-12-01 — End: 2023-12-01
  Administered 2023-12-01: 200 mg via INTRAVENOUS

## 2023-12-01 MED ORDER — SODIUM CHLORIDE 0.9 % IR SOLN
Status: DC | PRN
  Administered 2023-12-01: 3000 mL

## 2023-12-01 MED ORDER — ACETAMINOPHEN 500 MG PO TABS
1000.0000 mg | ORAL_TABLET | Freq: Once | ORAL | Status: AC
  Administered 2023-12-01: 1000 mg via ORAL

## 2023-12-01 MED ORDER — PROPOFOL 10 MG/ML IV BOLUS
INTRAVENOUS | Status: AC
  Filled 2023-12-01: qty 20

## 2023-12-01 MED ORDER — MIDAZOLAM HCL 5 MG/5ML IJ SOLN
INTRAMUSCULAR | Status: DC | PRN
  Administered 2023-12-01: 2 mg via INTRAVENOUS

## 2023-12-01 MED ORDER — ACETAMINOPHEN 160 MG/5ML PO SOLN: 1000.0000 mg | Freq: Once | ORAL | Status: DC | PRN

## 2023-12-01 MED ORDER — PHENYLEPHRINE 80 MCG/ML (10ML) SYRINGE FOR IV PUSH (FOR BLOOD PRESSURE SUPPORT)
PREFILLED_SYRINGE | INTRAVENOUS | Status: AC
  Filled 2023-12-01: qty 10

## 2023-12-01 MED ORDER — KETOROLAC TROMETHAMINE 30 MG/ML IJ SOLN
INTRAMUSCULAR | Status: DC | PRN
  Administered 2023-12-01: 30 mg via INTRAVENOUS

## 2023-12-01 MED ORDER — BUPIVACAINE HCL (PF) 0.25 % IJ SOLN
INTRAMUSCULAR | Status: AC
  Filled 2023-12-01: qty 60

## 2023-12-01 MED ORDER — BUPIVACAINE-EPINEPHRINE (PF) 0.5% -1:200000 IJ SOLN
INTRAMUSCULAR | Status: AC
  Filled 2023-12-01: qty 60

## 2023-12-01 MED ORDER — DEXAMETHASONE SODIUM PHOSPHATE 10 MG/ML IJ SOLN
INTRAMUSCULAR | Status: AC
  Filled 2023-12-01: qty 1

## 2023-12-01 MED ORDER — ONDANSETRON HCL 4 MG/2ML IJ SOLN
INTRAMUSCULAR | Status: DC | PRN
  Administered 2023-12-01: 4 mg via INTRAVENOUS

## 2023-12-01 MED ORDER — OXYCODONE HCL 5 MG/5ML PO SOLN: 5.0000 mg | Freq: Once | ORAL | Status: DC | PRN

## 2023-12-01 MED ORDER — CEFAZOLIN SODIUM-DEXTROSE 2-4 GM/100ML-% IV SOLN
INTRAVENOUS | Status: AC
  Filled 2023-12-01: qty 100

## 2023-12-01 MED ORDER — ONDANSETRON HCL 4 MG PO TABS: 4.0000 mg | ORAL_TABLET | Freq: Three times a day (TID) | ORAL | 0 refills | Status: AC | PRN

## 2023-12-01 MED ORDER — HYDROCODONE-ACETAMINOPHEN 5-325 MG PO TABS: 1.0000 | ORAL_TABLET | Freq: Four times a day (QID) | ORAL | 0 refills | Status: AC | PRN

## 2023-12-01 MED ORDER — ACETAMINOPHEN 500 MG PO TABS
ORAL_TABLET | ORAL | Status: AC
  Filled 2023-12-01: qty 2

## 2023-12-01 MED ORDER — LIDOCAINE 2% (20 MG/ML) 5 ML SYRINGE
INTRAMUSCULAR | Status: AC
  Filled 2023-12-01: qty 5

## 2023-12-01 MED ORDER — KETOROLAC TROMETHAMINE 30 MG/ML IJ SOLN
INTRAMUSCULAR | Status: AC
  Filled 2023-12-01: qty 1

## 2023-12-01 MED ORDER — BUPIVACAINE-EPINEPHRINE (PF) 0.5% -1:200000 IJ SOLN
INTRAMUSCULAR | Status: DC | PRN
  Administered 2023-12-01: 15 mL

## 2023-12-01 MED ORDER — DEXAMETHASONE SODIUM PHOSPHATE 10 MG/ML IJ SOLN
INTRAMUSCULAR | Status: DC | PRN
  Administered 2023-12-01: 10 mg via INTRAVENOUS

## 2023-12-01 MED ORDER — LACTATED RINGERS IV SOLN: INTRAVENOUS | Status: DC

## 2023-12-01 MED ORDER — FENTANYL CITRATE (PF) 100 MCG/2ML IJ SOLN
INTRAMUSCULAR | Status: AC
Start: 2023-12-01 — End: ?
  Filled 2023-12-01: qty 2

## 2023-12-01 MED ORDER — PHENYLEPHRINE HCL (PRESSORS) 10 MG/ML IV SOLN
INTRAVENOUS | Status: DC | PRN
  Administered 2023-12-01 (×3): 80 ug via INTRAVENOUS

## 2023-12-01 MED ORDER — FENTANYL CITRATE (PF) 100 MCG/2ML IJ SOLN
INTRAMUSCULAR | Status: AC
  Filled 2023-12-01: qty 2

## 2023-12-01 MED ORDER — LIDOCAINE HCL (CARDIAC) PF 100 MG/5ML IV SOSY
PREFILLED_SYRINGE | INTRAVENOUS | Status: DC | PRN
  Administered 2023-12-01: 60 mg via INTRAVENOUS

## 2023-12-01 MED ORDER — ACETAMINOPHEN 500 MG PO TABS: 1000.0000 mg | ORAL_TABLET | Freq: Once | ORAL | Status: DC | PRN

## 2023-12-01 MED ORDER — ONDANSETRON HCL 4 MG/2ML IJ SOLN
INTRAMUSCULAR | Status: AC
  Filled 2023-12-01: qty 2

## 2023-12-01 MED ORDER — FENTANYL CITRATE (PF) 100 MCG/2ML IJ SOLN
INTRAMUSCULAR | Status: DC | PRN
Start: 2023-12-01 — End: 2023-12-01
  Administered 2023-12-01 (×2): 25 ug via INTRAVENOUS

## 2023-12-01 MED ORDER — CEFAZOLIN SODIUM-DEXTROSE 2-4 GM/100ML-% IV SOLN
2.0000 g | INTRAVENOUS | Status: AC
  Administered 2023-12-01: 2 g via INTRAVENOUS

## 2023-12-01 MED ORDER — OXYCODONE HCL 5 MG PO TABS: 5.0000 mg | ORAL_TABLET | Freq: Once | ORAL | Status: DC | PRN

## 2023-12-01 SURGICAL SUPPLY — 23 items
BLADE SHAVER TORPEDO 4X13 (MISCELLANEOUS) ×2 IMPLANT
BNDG ELASTIC 6INX 5YD STR LF (GAUZE/BANDAGES/DRESSINGS) ×2 IMPLANT
CLSR STERI-STRIP ANTIMIC 1/2X4 (GAUZE/BANDAGES/DRESSINGS) ×2 IMPLANT
DRAPE U-SHAPE 47X51 STRL (DRAPES) ×2 IMPLANT
DRAPE-T ARTHROSCOPY W/POUCH (DRAPES) ×2 IMPLANT
DURAPREP 26ML APPLICATOR (WOUND CARE) ×2 IMPLANT
GAUZE PAD ABD 8X10 STRL (GAUZE/BANDAGES/DRESSINGS) ×2 IMPLANT
GAUZE SPONGE 4X4 12PLY STRL (GAUZE/BANDAGES/DRESSINGS) ×2 IMPLANT
GLOVE BIO SURGEON STRL SZ7.5 (GLOVE) ×4 IMPLANT
GLOVE BIOGEL PI IND STRL 8 (GLOVE) ×4 IMPLANT
GOWN STRL REUS W/ TWL LRG LVL3 (GOWN DISPOSABLE) ×2 IMPLANT
GOWN STRL REUS W/TWL XL LVL3 (GOWN DISPOSABLE) ×4 IMPLANT
MANIFOLD NEPTUNE II (INSTRUMENTS) ×2 IMPLANT
PACK ARTHROSCOPY DSU (CUSTOM PROCEDURE TRAY) ×2 IMPLANT
PACK BASIN DAY SURGERY FS (CUSTOM PROCEDURE TRAY) ×2 IMPLANT
SLEEVE SCD COMPRESS KNEE MED (STOCKING) IMPLANT
SUT ETHILON 4 0 PS 2 18 (SUTURE) IMPLANT
SUT MNCRL AB 3-0 PS2 18 (SUTURE) ×2 IMPLANT
TOWEL GREEN STERILE FF (TOWEL DISPOSABLE) ×2 IMPLANT
TUBE CONNECTING 20X1/4 (TUBING) IMPLANT
TUBING ARTHROSCOPY IRRIG 16FT (MISCELLANEOUS) ×2 IMPLANT
WAND ABLATOR APOLLO I90 (BUR) IMPLANT
WRAP KNEE MAXI GEL POST OP (GAUZE/BANDAGES/DRESSINGS) IMPLANT

## 2023-12-01 NOTE — Op Note (Signed)
 Surgery Date: 12/01/2023  Surgeon(s): Yolonda Kida, MD  Assistant:  Dion Saucier, PA-C  Assistant Attestation:  [facility], present and scrubbed for the entire procedure.  ANESTHESIA:  general, with quarter percent plain Marcaine local  FLUIDS: Per anesthesia record.   ESTIMATED BLOOD LOSS: minimal  PREOPERATIVE DIAGNOSES:  1.  Right knee lateral meniscus tear   POSTOPERATIVE DIAGNOSES:  1.  Right knee lateral meniscus tear 2.  Right knee medial shelf plica 3.  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  Right knee arthroscopy with limited synovectomy with medial shelf plica resection 2.  Right knee arthroscopy with arthroscopic partial lateral meniscectomy 3.Right knee arthroscopy with arthroscopic chondroplasty medial femoral condyle and medial tibial plateau.   DESCRIPTION OF PROCEDURE: Ms. Selke is a 53 y.o.-year-old female with right knee lateral meniscus tear. Plans are to proceed with partial lateral meniscectomy and diagnostic arthroscopy with debridement as indicated. Full discussion held regarding risks benefits alternatives and complications related surgical intervention. Conservative care options reviewed. All questions answered.  The patient was identified in the preoperative holding area and the operative extremity was marked. The patient was brought to the operating room and transferred to operating table in a supine position. Satisfactory general anesthesia was induced by anesthesiology.    Standard anterolateral, anteromedial arthroscopy portals were obtained. The anteromedial portal was obtained with a spinal needle for localization under direct visualization with subsequent diagnostic findings.   Anteromedial and anterolateral chambers: mild synovitis. The synovitis was debrided with a 4.5 mm full radius shaver through both the anteromedial and lateral portals.    Suprapatellar pouch and gutters: mild synovitis or debris. Patella chondral surface: Grade 0 Trochlear chondral surface: Grade 2 Patellofemoral tracking: Midline level Medial meniscus: Intact.  Medial femoral condyle flexion bearing surface: Central area from the extension surface of the flexion surface of grade II chondromalacia about 5 mm in width but stable edges.  However, at the trochlear area of the medial femoral condyle there was a central area of grade III chondromalacia that was unstable. Medial femoral condyle extension bearing surface: Grade 3 Medial tibial plateau: Grade 2 Anterior cruciate ligament:stable Posterior cruciate ligament:stable Lateral meniscus: Horizontal tear of the anterior horn and mid body.   Lateral femoral condyle flexion bearing surface: Grade 0 Lateral femoral condyle extension bearing surface: Grade 0 Lateral tibial plateau: Grade 0  There is also an obvious medial shelf plica noted on dynamic examination of the knee moving from flexion to extension this did contact the area of chondromalacia of the medial femoral condyle.  We resected this plica with motorized shaver.  Next, we performed partial lateral meniscectomy with arthroscopic biter as well as motorized shaver.  Care was taken to remove any unstable flaps.  After completion of synovectomy, diagnostic exam, and debridements as described, all compartments were checked and no residual debris remained. Hemostasis was achieved with the cautery wand. The portals were approximated with buried monocryl. All excess fluid was expressed from the joint.  Xeroform sterile gauze dressings were applied followed by Ace bandage and ice pack.   DISPOSITION: The patient was awakened from general anesthetic, extubated, taken to the recovery room in medically stable condition, no apparent complications. The patient may be weightbearing as tolerated to the operative lower extremity.  Range of motion of the left knee as  tolerated.

## 2023-12-01 NOTE — Anesthesia Preprocedure Evaluation (Signed)
 Anesthesia Evaluation  Patient identified by MRN, date of birth, ID band Patient awake    Reviewed: Allergy & Precautions, NPO status , Patient's Chart, lab work & pertinent test results  History of Anesthesia Complications Negative for: history of anesthetic complications  Airway Mallampati: II  TM Distance: >3 FB Neck ROM: Full    Dental  (+) Teeth Intact, Dental Advisory Given   Pulmonary neg shortness of breath, neg COPD, neg recent URI, Current Smoker and Patient abstained from smoking.   breath sounds clear to auscultation       Cardiovascular hypertension, Pt. on medications (-) angina (-) Past MI and (-) CHF  Rhythm:Regular     Neuro/Psych negative neurological ROS  negative psych ROS   GI/Hepatic Neg liver ROS,GERD  Medicated and Controlled,,  Endo/Other  negative endocrine ROS    Renal/GU negative Renal ROS     Musculoskeletal Right knee lateral meniscus tea   Abdominal   Peds  Hematology negative hematology ROS (+)   Anesthesia Other Findings   Reproductive/Obstetrics                             Anesthesia Physical Anesthesia Plan  ASA: 2  Anesthesia Plan: General   Post-op Pain Management: Tylenol PO (pre-op)* and Toradol IV (intra-op)*   Induction: Intravenous  PONV Risk Score and Plan: 3 and Ondansetron and Dexamethasone  Airway Management Planned: LMA  Additional Equipment: None  Intra-op Plan:   Post-operative Plan: Extubation in OR  Informed Consent: I have reviewed the patients History and Physical, chart, labs and discussed the procedure including the risks, benefits and alternatives for the proposed anesthesia with the patient or authorized representative who has indicated his/her understanding and acceptance.     Dental advisory given  Plan Discussed with: CRNA  Anesthesia Plan Comments:        Anesthesia Quick Evaluation

## 2023-12-01 NOTE — H&P (Signed)
 ORTHOPAEDIC H and P  REQUESTING PHYSICIAN: Yolonda Kida, MD  PCP:  Tiffany Kocher, DO  Chief Complaint: Right knee pain  HPI: Cheryl Carlson is a 53 y.o. female who complains of right knee pain.  Hjere today for knee arthroscopy.  No new complaints.  Past Medical History:  Diagnosis Date   Diverticulosis sigmoid colon    2021   GERD (gastroesophageal reflux disease)    Hypertension    Mitral valve prolapse    Past Surgical History:  Procedure Laterality Date   APPENDECTOMY     TOTAL SHOULDER ARTHROPLASTY Left 2023   at surgical center   UPPER GASTROINTESTINAL ENDOSCOPY     uterine tumor removal  2011   Social History   Socioeconomic History   Marital status: Widowed    Spouse name: Not on file   Number of children: 1   Years of education: Not on file   Highest education level: Some college, no degree  Occupational History   Occupation: Cusodial  Tobacco Use   Smoking status: Every Day    Current packs/day: 0.50    Types: Cigarettes   Smokeless tobacco: Never  Vaping Use   Vaping status: Never Used  Substance and Sexual Activity   Alcohol use: Yes    Comment: socially   Drug use: Yes    Types: Marijuana    Comment: ld 11/30/2023   Sexual activity: Not Currently    Partners: Male    Comment: WIDOWED  Other Topics Concern   Not on file  Social History Narrative   Not on file   Social Drivers of Health   Financial Resource Strain: Low Risk  (06/22/2023)   Overall Financial Resource Strain (CARDIA)    Difficulty of Paying Living Expenses: Not very hard  Food Insecurity: Food Insecurity Present (06/22/2023)   Hunger Vital Sign    Worried About Running Out of Food in the Last Year: Sometimes true    Ran Out of Food in the Last Year: Sometimes true  Transportation Needs: No Transportation Needs (06/22/2023)   PRAPARE - Administrator, Civil Service (Medical): No    Lack of Transportation (Non-Medical): No  Physical Activity:  Sufficiently Active (06/22/2023)   Exercise Vital Sign    Days of Exercise per Week: 7 days    Minutes of Exercise per Session: 30 min  Stress: No Stress Concern Present (06/22/2023)   [employer] of Occupational Health - Occupational Stress Questionnaire    Feeling of Stress : Only a little  Social Connections: Socially Isolated (06/22/2023)   Social Connection and Isolation Panel [NHANES]    Frequency of Communication with Friends and Family: More than three times a week    Frequency of Social Gatherings with Friends and Family: Once a week    Attends Religious Services: Never    Database administrator or Organizations: No    Attends Engineer, structural: Not on file    Marital Status: Widowed   Family History  Problem Relation Age of Onset   Heart disease Mother    Hypertension Mother    Diabetes Mother    Hypertension Father    Heart attack Brother 44   Diabetes Maternal Grandmother    Heart attack Brother    Stomach cancer Neg Hx    Esophageal cancer Neg Hx    Colon cancer Neg Hx    Rectal cancer Neg Hx    Thyroid disease Neg Hx    No Known  Allergies Prior to Admission medications   Medication Sig Start Date End Date Taking? Authorizing Provider  amlodipine-olmesartan (AZOR) 10-20 MG tablet Take 1 tablet by mouth daily. 11/30/23  Yes Fountain, Madison L, NP  Ascorbic Acid (VITAMIN C PO) Take by mouth daily.   Yes [provider]  aspirin EC 81 MG tablet Take 81 mg by mouth daily.   Yes [provider]  Multiple Vitamins-Minerals (HAIR/SKIN/NAILS) TABS Take 1 tablet by mouth daily.    Yes [provider]  omeprazole (PRILOSEC) 40 MG capsule TAKE 1 CAPSULE(40 MG) BY MOUTH EVERY MORNING 30 MINUTES BEFORE BREAKFAST 07/20/23  Yes Tiffany Kocher, DO  rosuvastatin (CRESTOR) 20 MG tablet Take 1 tablet (20 mg total) by mouth daily. 06/23/23  Yes Tiffany Kocher, DO  tiZANidine (ZANAFLEX) 2 MG tablet Take 1 tablet (2 mg total) by mouth every  6 (six) hours as needed for muscle spasms. 06/23/23  Yes Tiffany Kocher, DO   No results found.  Positive ROS: All other systems have been reviewed and were otherwise negative with the exception of those mentioned in the HPI and as above.  Physical Exam: General: Alert, no acute distress Cardiovascular: No pedal edema Respiratory: No cyanosis, no use of accessory musculature GI: No organomegaly, abdomen is soft and non-tender Skin: No lesions in the area of chief complaint Neurologic: Sensation intact distally Psychiatric: Patient is competent for consent with normal mood and affect Lymphatic: No axillary or cervical lymphadenopathy  MUSCULOSKELETAL: RLE- wwp, nvi  Assessment: Right knee lateral mensicus tear  Plan: To OR today for diagnostic arthroscopy with partial lateral meiscectomy and debridements as indicated.  Plan for dc home post op  She has provided informed consent.    Yolonda Kida, MD Cell [phone]    12/01/2023 6:38 AM

## 2023-12-01 NOTE — Transfer of Care (Signed)
 Immediate Anesthesia Transfer of Care Note  Patient: Cheryl Carlson  Procedure(s) Performed: KNEE ARTHROSCOPY WITH PARTIAL LATERAL MENISECTOMY (Right: Knee)  Patient Location: PACU  Anesthesia Type:General  Level of Consciousness: drowsy  Airway & Oxygen Therapy: Patient Spontanous Breathing and Patient connected to face mask oxygen  Post-op Assessment: Report given to RN and Post -op Vital signs reviewed and stable  Post vital signs: Reviewed and stable  Last Vitals:  Vitals Value Taken Time  BP 136/89 12/01/23 0814  Temp    Pulse 75 12/01/23 0814  Resp 11 12/01/23 0814  SpO2 100 % 12/01/23 0814  Vitals shown include unfiled device data.  Last Pain:  Vitals:   12/01/23 0621  TempSrc: Oral  PainSc: 0-No pain      Patients Stated Pain Goal: 4 (12/01/23 4696)  Complications: No notable events documented.

## 2023-12-01 NOTE — Brief Op Note (Signed)
 12/01/2023  8:13 AM  PATIENT:  Ekta L Ore  53 y.o. female  PRE-OPERATIVE DIAGNOSIS:  Right knee lateral meniscus tear  POST-OPERATIVE DIAGNOSIS:  Right knee lateral meniscus tear  PROCEDURE:  Procedure(s): KNEE ARTHROSCOPY WITH PARTIAL LATERAL MENISECTOMY (Right)  SURGEON:  Surgeons and Role:    * Yolonda Kida, MD - Primary  PHYSICIAN ASSISTANT: Dion Saucier, PA-C  ANESTHESIA:   local and general  EBL:  5 mL   BLOOD ADMINISTERED:none  DRAINS: none   LOCAL MEDICATIONS USED:  MARCAINE     SPECIMEN:  No Specimen  DISPOSITION OF SPECIMEN:  N/A  COUNTS:  YES  TOURNIQUET:  * No tourniquets in log *  DICTATION: .Note written in EPIC  PLAN OF CARE: Discharge to home after PACU  PATIENT DISPOSITION:  PACU - hemodynamically stable.   Delay start of Pharmacological VTE agent (>24hrs) due to surgical blood loss or risk of bleeding: not applicable

## 2023-12-01 NOTE — Discharge Instructions (Addendum)
 Post-operative patient instructions  Knee Arthroscopy   Ice:  Place intermittent ice or cooler pack over your knee, 30 minutes on and 30 minutes off.  Continue this for the first 72 hours after surgery, then save ice for use after therapy sessions or on more active days.   Weight:  You may bear weight on your leg as your symptoms allow. DVT prevention: Perform ankle pumps as able throughout the day on the operative extremity.  Be mobile as possible with ambulation as able.  You should also take an 81 mg aspirin once per day x6 weeks. Crutches:  Use crutches (or walker) to assist in walking until told to discontinue by your physical therapist or physician. This will help to reduce pain. Strengthening:  Perform simple thigh squeezes (isometric quad contractions) and straight leg lifts as you are able (3 sets of 5 to 10 repetitions, 3 times a day).  For the leg lifts, have someone support under your ankle in the beginning until you have increased strength enough to do this on your own.  To help get started on thigh squeezes, place a pillow under your knee and push down on the pillow with back of knee (sometimes easier to do than with your leg fully straight). Motion:  Perform gentle knee motion as tolerated - this is gentle bending and straightening of the knee. Seated heel slides: you can start by sitting in a chair, remove your brace, and gently slide your heel back on the floor - allowing your knee to bend. Have someone help you straighten your knee (or use your other leg/foot hooked under your ankle.  Dressing:  Perform 1st dressing change at 3 days postoperative. A moderate amount of blood tinged drainage is to be expected.  So if you bleed through the dressing on the first or second day or if you have fevers, it is fine to change the dressing/check the wounds early and redress wound. Elevate your leg.  If it bleeds through again, or if the incisions are leaking frank blood, please call the office. May  change dressing every 1-2 days thereafter to help watch wounds. Can purchase Tegderm (or 6M Nexcare) water resistant dressings at local pharmacy / [facility]. Shower:  shower is ok after 3 days.  Please take shower, NO bath. Recover with gauze and ace wrap to help keep wounds protected.   Pain medication:  A narcotic pain medication has been prescribed.  Take as directed.  Typically you need narcotic pain medication more regularly during the first 3 to 5 days after surgery.  Decrease your use of the medication as the pain improves.  Narcotics can sometimes cause constipation, even after a few doses.  If you have problems with constipation, you can take an over the counter stool softener or light laxative.  If you have persistent problems, please notify your physician's office. Physical therapy: Additional activity guidelines to be provided by your physician or physical therapist at follow-up visits.  Driving: Do not recommend driving x 1-2 weeks post surgical, especially if surgery performed on right side. Should not drive while taking narcotic pain medications. It typically takes at least 2 weeks to restore sufficient neuromuscular function for normal reaction times for driving safety.  Call [phone] for questions or problems. Evenings you will be forwarded to the hospital operator.  Ask for the orthopaedic physician on call. Please call if you experience:    Redness, foul smelling, or persistent drainage from the surgical site  worsening knee pain and swelling  not responsive to medication  any calf pain and or swelling of the lower leg  temperatures greater than 101.5 F other questions or concerns   Thank you for allowing [place] to be a part of your care    Post Anesthesia Home Care Instructions  Activity: Get plenty of rest for the remainder of the day. A responsible individual must stay with you for 24 hours following the procedure.  For the next 24 hours, DO NOT: -Drive a car -Social worker -Drink alcoholic beverages -Take any medication unless instructed by your physician -Make any legal decisions or sign important papers.  Meals: Start with liquid foods such as gelatin or soup. Progress to regular foods as tolerated. Avoid greasy, spicy, heavy foods. If nausea and/or vomiting occur, drink only clear liquids until the nausea and/or vomiting subsides. Call your physician if vomiting continues.  Special Instructions/Symptoms: Your throat may feel dry or sore from the anesthesia or the breathing tube placed in your throat during surgery. If this causes discomfort, gargle with warm salt water. The discomfort should disappear within 24 hours.  May have Tylenol after 1pm if needed.

## 2023-12-01 NOTE — Anesthesia Procedure Notes (Signed)
 Procedure Name: LMA Insertion Date/Time: 12/01/2023 7:39 AM  Performed by: Lauralyn Primes, CRNAPre-anesthesia Checklist: Patient identified, Emergency Drugs available, Suction available and Patient being monitored Patient Re-evaluated:Patient Re-evaluated prior to induction Oxygen Delivery Method: Circle system utilized Preoxygenation: Pre-oxygenation with 100% oxygen Induction Type: IV induction Ventilation: Mask ventilation without difficulty LMA: LMA inserted LMA Size: 4.0 Number of attempts: 1 Airway Equipment and Method: Bite block Placement Confirmation: positive ETCO2 Tube secured with: Tape Dental Injury: Teeth and Oropharynx as per pre-operative assessment

## 2023-12-01 NOTE — Anesthesia Postprocedure Evaluation (Signed)
 Anesthesia Post Note  Patient: Carmisha L Belgrave  Procedure(s) Performed: KNEE ARTHROSCOPY WITH PARTIAL LATERAL MENISECTOMY (Right: Knee)     Patient location during evaluation: PACU Anesthesia Type: General Level of consciousness: awake and alert Pain management: pain level controlled Vital Signs Assessment: post-procedure vital signs reviewed and stable Respiratory status: spontaneous breathing, nonlabored ventilation and respiratory function stable Cardiovascular status: blood pressure returned to baseline and stable Postop Assessment: no apparent nausea or vomiting Anesthetic complications: no   No notable events documented.  Last Vitals:  Vitals:   12/01/23 0845 12/01/23 0906  BP: 100/69 134/89  Pulse: 80 78  Resp: 20 16  Temp:  (!) 36.4 C  SpO2: 92% 95%    Last Pain:  Vitals:   12/01/23 0906  TempSrc: Temporal  PainSc: 0-No pain                 Frenchie Dangerfield

## 2023-12-02 ENCOUNTER — Encounter (HOSPITAL_BASED_OUTPATIENT_CLINIC_OR_DEPARTMENT_OTHER): Payer: Self-pay | Admitting: Orthopedic Surgery

## 2024-02-07 ENCOUNTER — Other Ambulatory Visit: Payer: Self-pay | Admitting: Student

## 2024-02-07 DIAGNOSIS — G8929 Other chronic pain: Secondary | ICD-10-CM

## 2024-04-08 ENCOUNTER — Ambulatory Visit: Admitting: Student

## 2024-04-09 ENCOUNTER — Ambulatory Visit (INDEPENDENT_AMBULATORY_CARE_PROVIDER_SITE_OTHER): Admitting: Student

## 2024-04-09 VITALS — BP 109/79 | HR 84 | Wt 140.2 lb

## 2024-04-09 DIAGNOSIS — Z0000 Encounter for general adult medical examination without abnormal findings: Secondary | ICD-10-CM | POA: Diagnosis not present

## 2024-04-09 DIAGNOSIS — Z1231 Encounter for screening mammogram for malignant neoplasm of breast: Secondary | ICD-10-CM

## 2024-04-09 DIAGNOSIS — I10 Essential (primary) hypertension: Secondary | ICD-10-CM | POA: Diagnosis present

## 2024-04-09 DIAGNOSIS — E782 Mixed hyperlipidemia: Secondary | ICD-10-CM | POA: Diagnosis not present

## 2024-04-09 NOTE — Assessment & Plan Note (Signed)
-  Continue rosuvastatin  20 mg daily - Mammogram ordered

## 2024-04-09 NOTE — Patient Instructions (Signed)
 It was great to see you! Thank you for allowing me to participate in your care!   I recommend that you always bring your medications to each appointment as this makes it easy to ensure we are on the correct medications and helps us  not miss when refills are needed.  Our plans for today:  - Follow-up in October/November for annual exam.  I recommend you undergo a mammogram.   You can call to schedule an appointment by calling [phone].  Directions [address] [place], [place] [address]  Please let me know if you have questions. I will send you a letter or call you with results.   Take care and seek immediate care sooner if you develop any concerns. Please remember to show up 15 minutes before your scheduled appointment time!  Gladis Church, DO [facility]

## 2024-04-09 NOTE — Assessment & Plan Note (Signed)
 Well-controlled today. - Continue amlodipine -losartan 10-25 mg tablet daily

## 2024-04-09 NOTE — Progress Notes (Signed)
    SUBJECTIVE:   CHIEF COMPLAINT / HPI:   Hypertension  hyperlipidemia Currently taking amlodipine -olmesartan  10-25 mg tablet daily, reports adherence and no side effects.  Taking Crestor  20 mg tablet, reports adherence and no side effects.  Healthcare maintenance - Due for mammogram, ordered - Due for pneumonia vaccination, Tdap. She politely declines today.  Reports she has never taken the hepatitis B vaccine, and declines this as well today.  OBJECTIVE:   BP 109/79   Pulse 84   Wt 140 lb 3.2 oz (63.6 kg)   LMP 04/22/2016   SpO2 100%   BMI 24.07 kg/m    General: NAD, pleasant Respiratory: CTAB, normal wob on RA GI: Abdomen is soft, not tender, not distended. BS present Skin: Warm and dry  ASSESSMENT/PLAN:   Assessment & Plan Essential hypertension, benign Well-controlled today. - Continue amlodipine -losartan 10-25 mg tablet daily Mixed hyperlipidemia Healthcare maintenance -Continue rosuvastatin  20 mg daily - Mammogram ordered   Follow-up recommendations Due for annual labs in October 2025.  Gladis Church, DO [facility]

## 2024-04-10 ENCOUNTER — Other Ambulatory Visit: Payer: Self-pay | Admitting: Student

## 2024-04-10 DIAGNOSIS — K219 Gastro-esophageal reflux disease without esophagitis: Secondary | ICD-10-CM

## 2024-05-03 ENCOUNTER — Other Ambulatory Visit: Payer: Self-pay | Admitting: Student

## 2024-05-03 DIAGNOSIS — E782 Mixed hyperlipidemia: Secondary | ICD-10-CM

## 2024-05-08 ENCOUNTER — Other Ambulatory Visit: Payer: Self-pay | Admitting: Student

## 2024-05-08 DIAGNOSIS — I10 Essential (primary) hypertension: Secondary | ICD-10-CM
